# Patient Record
Sex: Male | Born: 1955 | Race: Black or African American | Hispanic: No | Marital: Married | Smoking: Former smoker
Health system: Southern US, Community
[De-identification: ages and names within clinical notes are randomized; demographics above are authoritative.]

## PROBLEM LIST (undated history)

## (undated) DIAGNOSIS — E119 Type 2 diabetes mellitus without complications: Secondary | ICD-10-CM

## (undated) HISTORY — PX: ACHILLES TENDON REPAIR: SUR1153

## (undated) HISTORY — PX: HERNIA REPAIR: SHX51

---

## 2000-06-21 ENCOUNTER — Encounter: Admission: RE | Admit: 2000-06-21 | Discharge: 2000-06-21 | Payer: Self-pay | Admitting: Emergency Medicine

## 2000-09-21 ENCOUNTER — Emergency Department (HOSPITAL_COMMUNITY): Admission: EM | Admit: 2000-09-21 | Discharge: 2000-09-21 | Payer: Self-pay | Admitting: Emergency Medicine

## 2002-02-06 ENCOUNTER — Emergency Department (HOSPITAL_COMMUNITY): Admission: EM | Admit: 2002-02-06 | Discharge: 2002-02-06 | Payer: Self-pay | Admitting: Emergency Medicine

## 2002-02-06 ENCOUNTER — Encounter: Payer: Self-pay | Admitting: Emergency Medicine

## 2006-04-18 ENCOUNTER — Emergency Department (HOSPITAL_COMMUNITY): Admission: EM | Admit: 2006-04-18 | Discharge: 2006-04-18 | Payer: Self-pay | Admitting: Emergency Medicine

## 2006-06-12 ENCOUNTER — Ambulatory Visit: Admission: RE | Admit: 2006-06-12 | Discharge: 2006-06-12 | Payer: Self-pay | Admitting: Internal Medicine

## 2006-06-30 ENCOUNTER — Ambulatory Visit: Payer: Self-pay | Admitting: Pulmonary Disease

## 2010-09-03 ENCOUNTER — Other Ambulatory Visit: Payer: Self-pay | Admitting: Orthopaedic Surgery

## 2010-09-03 ENCOUNTER — Other Ambulatory Visit (HOSPITAL_COMMUNITY)
Admission: RE | Admit: 2010-09-03 | Discharge: 2010-09-03 | Disposition: A | Discharge status: Home / Self Care | Source: Ambulatory Visit | Attending: Orthopaedic Surgery | Admitting: Orthopaedic Surgery

## 2010-09-03 DIAGNOSIS — M6530 Trigger finger, unspecified finger: Secondary | ICD-10-CM | POA: Insufficient documentation

## 2014-02-09 ENCOUNTER — Emergency Department (HOSPITAL_COMMUNITY)
Admission: EM | Admit: 2014-02-09 | Discharge: 2014-02-09 | Disposition: A | Discharge status: Home / Self Care | Payer: PRIVATE HEALTH INSURANCE | Attending: Emergency Medicine | Admitting: Emergency Medicine

## 2014-02-09 ENCOUNTER — Encounter (HOSPITAL_COMMUNITY): Payer: Self-pay | Admitting: Emergency Medicine

## 2014-02-09 DIAGNOSIS — Y9389 Activity, other specified: Secondary | ICD-10-CM | POA: Insufficient documentation

## 2014-02-09 DIAGNOSIS — S61217A Laceration without foreign body of left little finger without damage to nail, initial encounter: Secondary | ICD-10-CM

## 2014-02-09 DIAGNOSIS — W260XXA Contact with knife, initial encounter: Secondary | ICD-10-CM | POA: Diagnosis not present

## 2014-02-09 DIAGNOSIS — S61219A Laceration without foreign body of unspecified finger without damage to nail, initial encounter: Secondary | ICD-10-CM | POA: Insufficient documentation

## 2014-02-09 DIAGNOSIS — E119 Type 2 diabetes mellitus without complications: Secondary | ICD-10-CM | POA: Insufficient documentation

## 2014-02-09 DIAGNOSIS — Y929 Unspecified place or not applicable: Secondary | ICD-10-CM | POA: Insufficient documentation

## 2014-02-09 HISTORY — DX: Type 2 diabetes mellitus without complications: E11.9

## 2014-02-09 MED ORDER — LIDOCAINE HCL (PF) 2 % IJ SOLN
INTRAMUSCULAR | Status: AC
  Filled 2014-02-09: qty 10

## 2014-02-09 MED ORDER — BACITRACIN ZINC 500 UNIT/GM EX OINT
TOPICAL_OINTMENT | CUTANEOUS | Status: AC
  Filled 2014-02-09: qty 0.9

## 2014-02-09 MED ORDER — HYDROCODONE-ACETAMINOPHEN 5-325 MG PO TABS: 1.0000 | ORAL_TABLET | Freq: Four times a day (QID) | ORAL | Status: DC | PRN

## 2014-02-09 NOTE — ED Provider Notes (Signed)
LACERATION REPAIR LEFT 5TH FINGER.  Patient identified by arm band. Permission for the procedure is given by the patient.  Laceration repair was explained to the patient in terms which he understood. He knowledge these explanations and is in agreement with the procedure. Procedural time out taken.  The left fourth and fifth fingers as well as the dorsum of the left hand were painted with Betadine. Digital block was achieved with 2% plain lidocaine. After proper anesthetic, the patient was draped in the usual sterile fashion. The laceration was repaired with 6 interrupted sutures of 4-0 nylon. The wound measures 3.5 cm. The patient has good wound edge approximation and homeostasis. Patient tolerated the procedure well without problem. Dressing applied.  Patient given instructions on keeping the wound clean and dry, as well as to have the sutures removed in 7 days. The patient is advised to return to the emergency department sooner if any signs of infection.  Kathie DikeHobson M Shrey Boike, PA-C 02/09/14 [phone]

## 2014-02-09 NOTE — ED Provider Notes (Signed)
CSN: [id number]     Arrival date & time 02/09/14  [id number] History   First MD Initiated Contact with Patient 02/09/14 0820     Chief Complaint  Patient presents with  . Extremity Laceration     (Consider location/radiation/quality/duration/timing/severity/associated sxs/prior Treatment) HPI patient lacerated left fifth finger on a knife when he reached into a sink at work one hour prior to coming here. No treatment prior to coming here no other injury  No past medical history on file. past medical history diabetes No past surgical history on file. No family history on file. History  Substance Use Topics  . Smoking status: Not on file  . Smokeless tobacco: Not on file  . Alcohol Use: Not on file   no tobacco no alcohol no drugs  Review of Systems  Constitutional: Negative.   Skin: Positive for wound.       Laceration fifth finger left hand  Allergic/Immunologic: Positive for immunocompromised state.       Diabetic      Allergies  Review of patient's allergies indicates not on file.  Home Medications   Prior to Admission medications   Not on File   There were no vitals taken for this visit. Physical Exam  Nursing note and vitals reviewed. Constitutional: He appears well-developed and well-nourished. No distress.  HENT:  Head: Normocephalic and atraumatic.  Right Ear: External ear normal.  Left Ear: External ear normal.  Neck: Neck supple.  Cardiovascular: Normal rate.   Abdominal:  OBese  Musculoskeletal:  Left upper extremity fifth finger linear laceration distal one fourth of middle phalanx and entire length of distal phalanx, dorso ulnar aspect. Not involvingnail, parallel to long axis of finger. Good capillary refill. Full range of motion. Sensation intact    ED Course  Procedures (including critical care time) Labs Review Labs Reviewed - No data to display  Imaging Review No results found.   EKG Interpretation None     Laceration repaired by Mr.  Beverely PaceBryant. MDM   Final diagnoses:  None   planprescription Norco. Wound check 3 days. Sutures out 7 days Diagnosis laceration left fifth finger    Doug SouSam Mikeila Burgen, MD 02/09/14 [phone]

## 2014-02-09 NOTE — ED Notes (Signed)
Laceration to 5th digit of left hand. Pt works in [employer] downstairs (workman's comp) and cut his finger with a knife. Tetanus is UTD

## 2014-02-09 NOTE — Discharge Instructions (Signed)
Sutured Wound Care Do not remove bandage for 3 days unless it becomes wet or dirty. Get your wound checked in 3 days. Sutures to come out in 7 days. Take Tylenol for mild pain or the pain medicine prescribed for bad pain Sutures are stitches that can be used to close wounds. Wound care helps prevent pain and infection.  HOME CARE INSTRUCTIONS   Rest and elevate the injured area until all the pain and swelling are gone.  Only take over-the-counter or prescription medicines for pain, discomfort, or fever as directed by your caregiver.  After 48 hours, gently wash the area with mild soap and water once a day, or as directed. Rinse off the soap. Pat the area dry with a clean towel. Do not rub the wound. This may cause bleeding.  Follow your caregiver's instructions for how often to change the bandage (dressing). Stop using a dressing after 2 days or after the wound stops draining.  If the dressing sticks, moisten it with soapy water and gently remove it.  Apply ointment on the wound as directed.  Avoid stretching a sutured wound.  Drink enough fluids to keep your urine clear or pale yellow.  Follow up with your caregiver for suture removal as directed.  Use sunscreen on your wound for the next 3 to 6 months so the scar will not darken. SEEK IMMEDIATE MEDICAL CARE IF:   Your wound becomes red, swollen, hot, or tender.  You have increasing pain in the wound.  You have a red streak that extends from the wound.  There is pus coming from the wound.  You have a fever.  You have shaking chills.  There is a bad smell coming from the wound.  You have persistent bleeding from the wound. MAKE SURE YOU:   Understand these instructions.  Will watch your condition.  Will get help right away if you are not doing well or get worse. Document Released: 06/04/2004 Document Revised: 07/20/2011 Document Reviewed: 08/31/2010 [facility] Patient Information 2015 [employer], [place]. This information  is not intended to replace advice given to you by your health care provider. Make sure you discuss any questions you have with your health care provider.

## 2014-02-09 NOTE — ED Provider Notes (Signed)
Medical screening examination/treatment/procedure(s) were conducted as a shared visit with non-physician practitioner(s) and myself.  I personally evaluated the patient during the encounter. Mr. Timothy Jordan performed laceration repair only   EKG Interpretation None       Doug SouSam Pari Lombard, MD 02/09/14 [phone]

## 2014-02-12 ENCOUNTER — Emergency Department (HOSPITAL_COMMUNITY)
Admission: EM | Admit: 2014-02-12 | Discharge: 2014-02-12 | Disposition: A | Discharge status: Home / Self Care | Payer: PRIVATE HEALTH INSURANCE | Attending: Emergency Medicine | Admitting: Emergency Medicine

## 2014-02-12 ENCOUNTER — Encounter (HOSPITAL_COMMUNITY): Payer: Self-pay | Admitting: Emergency Medicine

## 2014-02-12 DIAGNOSIS — Y990 Civilian activity done for income or pay: Secondary | ICD-10-CM | POA: Insufficient documentation

## 2014-02-12 DIAGNOSIS — Y9289 Other specified places as the place of occurrence of the external cause: Secondary | ICD-10-CM | POA: Insufficient documentation

## 2014-02-12 DIAGNOSIS — Y9389 Activity, other specified: Secondary | ICD-10-CM | POA: Diagnosis not present

## 2014-02-12 DIAGNOSIS — Z4801 Encounter for change or removal of surgical wound dressing: Secondary | ICD-10-CM | POA: Diagnosis present

## 2014-02-12 DIAGNOSIS — E119 Type 2 diabetes mellitus without complications: Secondary | ICD-10-CM | POA: Insufficient documentation

## 2014-02-12 DIAGNOSIS — S61217D Laceration without foreign body of left little finger without damage to nail, subsequent encounter: Secondary | ICD-10-CM | POA: Diagnosis not present

## 2014-02-12 DIAGNOSIS — Z87891 Personal history of nicotine dependence: Secondary | ICD-10-CM | POA: Diagnosis not present

## 2014-02-12 DIAGNOSIS — W260XXA Contact with knife, initial encounter: Secondary | ICD-10-CM | POA: Diagnosis not present

## 2014-02-12 DIAGNOSIS — S61219D Laceration without foreign body of unspecified finger without damage to nail, subsequent encounter: Secondary | ICD-10-CM

## 2014-02-12 MED ORDER — BACITRACIN ZINC 500 UNIT/GM EX OINT
1.0000 "application " | TOPICAL_OINTMENT | Freq: Two times a day (BID) | CUTANEOUS | Status: DC
  Administered 2014-02-12: 1 via TOPICAL
  Filled 2014-02-12: qty 0.9

## 2014-02-12 NOTE — Discharge Instructions (Signed)

## 2014-02-12 NOTE — ED Notes (Signed)
Wound dressed with non-adherent dressing and wrapped with gauze. Instructions re-iterated to patient on wound care. Verbal understanding obtained.

## 2014-02-12 NOTE — ED Notes (Signed)
6 stiches present in left pinky. Finger does not show signs of infection at this time. Pt states he hit his left pinky on something this morning and it is throbbing.

## 2014-02-12 NOTE — ED Notes (Signed)
Patient with no complaints at this time. Respirations even and unlabored. Skin warm/dry. Discharge instructions reviewed with patient at this time. Patient given opportunity to voice concerns/ask questions. Patient discharged at this time and left Emergency Department with steady gait.   

## 2014-02-12 NOTE — ED Provider Notes (Signed)
CSN: [id number]     Arrival date & time 02/12/14  0803 History  This chart was scribed for No att. providers found by Annye AsaAnna Dorsett, ED Scribe. This patient was seen in room APA03/[facility]03 and the patient's care was started at 8:13 AM.    Chief Complaint  Patient presents with  . Wound Check    The history is provided by the patient. No language interpreter was used.    HPI Comments: Timothy Jordan is a 58 y.o. male who presents to the Emergency Department requesting recheck of laceration to his left fifth finger that occurred 3 days ago.  He denies any complications, such as fever, redness, drainage or swelling.  states the wound is healing well.  States he was advised to return here today for a recheck and  Friday to have the stitches removed.    Past Medical History  Diagnosis Date  . Diabetes mellitus without complication    Past Surgical History  Procedure Laterality Date  . Hernia repair    . Achilles tendon repair     No family history on file. History  Substance Use Topics  . Smoking status: Former Games developermoker  . Smokeless tobacco: Not on file  . Alcohol Use: No    Review of Systems  Constitutional: Negative for fever and chills.  Musculoskeletal: Negative for arthralgias, back pain and joint swelling.  Skin: Positive for wound.       Laceration left fifth finger  Neurological: Negative for dizziness, weakness and numbness.  Hematological: Does not bruise/bleed easily.  All other systems reviewed and are negative.   A complete 10 system review of systems was obtained and all systems are negative except as noted in the HPI and PMH.    Allergies  Review of patient's allergies indicates no known allergies.  Home Medications   Prior to Admission medications   Medication Sig Start Date End Date Taking? Authorizing Provider  HYDROcodone-acetaminophen (NORCO) 5-325 MG per tablet Take 1 tablet by mouth every 6 (six) hours as needed for moderate pain or severe pain. 02/09/14    Doug SouSam Jacubowitz, MD  HYDROcodone-acetaminophen (NORCO) 5-325 MG per tablet Take 1 tablet by mouth every 6 (six) hours as needed for moderate pain or severe pain. 02/09/14   Doug SouSam Jacubowitz, MD   There were no vitals taken for this visit. Physical Exam  Nursing note and vitals reviewed. Constitutional: He is oriented to person, place, and time. He appears well-developed and well-nourished. No distress.  HENT:  Head: Normocephalic and atraumatic.  Cardiovascular: Normal rate, regular rhythm, normal heart sounds and intact distal pulses.   No murmur heard. Pulmonary/Chest: Effort normal and breath sounds normal. No respiratory distress.  Musculoskeletal: Normal range of motion. He exhibits no edema and no tenderness.  Neurological: He is alert and oriented to person, place, and time. He exhibits normal muscle tone. Coordination normal.  Skin: Skin is warm and dry. Laceration noted.  Laceration to the lateral left fifth finger.  Appears to be healing well.  No erythema, drainage or edema.  Sensation intact, pt has full ROM of the finger  Psychiatric: He has a normal mood and affect. His behavior is normal.    ED Course  Procedures   DIAGNOSTIC STUDIES: Oxygen Saturation is 95% by my interpretation.    COORDINATION OF CARE: 8:13 AM Discussed treatment plan with pt at bedside and pt agreed to plan.   Labs Review Labs Reviewed - No data to display  Imaging Review No results found.  EKG Interpretation None      MDM   Final diagnoses:  Laceration of finger, subsequent encounter    Finger appears to be healing well.  No clinical signs of infection, NV intact.  Pt agrees to return here on Friday for suture removal.      Mir Fullilove L. Trisha Mangle, PA-C 02/12/14 [phone]

## 2014-02-12 NOTE — ED Provider Notes (Signed)
Medical screening examination/treatment/procedure(s) were performed by non-physician practitioner and as supervising physician I was immediately available for consultation/collaboration.   EKG Interpretation None       Donnetta HutchingBrian Gabriela Irigoyen, MD 02/12/14 1434

## 2014-02-12 NOTE — ED Notes (Signed)
Patient states he cut his left pinky finger on a knife while working Friday. States he was treated here and had laceration stitched. Was told to return today for recheck of wound.

## 2014-02-16 ENCOUNTER — Encounter (HOSPITAL_COMMUNITY): Payer: Self-pay | Admitting: Emergency Medicine

## 2014-02-16 ENCOUNTER — Emergency Department (HOSPITAL_COMMUNITY)
Admission: EM | Admit: 2014-02-16 | Discharge: 2014-02-16 | Disposition: A | Discharge status: Home / Self Care | Payer: PRIVATE HEALTH INSURANCE | Attending: Emergency Medicine | Admitting: Emergency Medicine

## 2014-02-16 DIAGNOSIS — Z794 Long term (current) use of insulin: Secondary | ICD-10-CM | POA: Insufficient documentation

## 2014-02-16 DIAGNOSIS — Z4802 Encounter for removal of sutures: Secondary | ICD-10-CM | POA: Insufficient documentation

## 2014-02-16 DIAGNOSIS — Z7982 Long term (current) use of aspirin: Secondary | ICD-10-CM | POA: Insufficient documentation

## 2014-02-16 DIAGNOSIS — E119 Type 2 diabetes mellitus without complications: Secondary | ICD-10-CM | POA: Diagnosis not present

## 2014-02-16 DIAGNOSIS — Z79899 Other long term (current) drug therapy: Secondary | ICD-10-CM | POA: Diagnosis not present

## 2014-02-16 DIAGNOSIS — Z87891 Personal history of nicotine dependence: Secondary | ICD-10-CM | POA: Insufficient documentation

## 2014-02-16 NOTE — Discharge Instructions (Signed)
°Emergency Department Resource Guide °1) Find a Doctor and Pay Out of Pocket °Although you won't have to find out who is covered by your insurance plan, it is a good idea to ask around and get recommendations. You will then need to call the office and see if the doctor you have chosen will accept you as a new patient and what types of options they offer for patients who are self-pay. Some doctors offer discounts or will set up payment plans for their patients who do not have insurance, but you will need to ask so you aren't surprised when you get to your appointment. ° °2) Contact Your Local Health Department °Not all health departments have doctors that can see patients for sick visits, but many do, so it is worth a call to see if yours does. If you don't know where your local health department is, you can check in your phone book. The CDC also has a tool to help you locate your state's health department, and many state websites also have listings of all of their local health departments. ° °3) Find a Walk-in Clinic °If your illness is not likely to be very severe or complicated, you may want to try a walk in clinic. These are popping up all over the country in pharmacies, drugstores, and shopping centers. They're usually staffed by nurse practitioners or physician assistants that have been trained to treat common illnesses and complaints. They're usually fairly quick and inexpensive. However, if you have serious medical issues or chronic medical problems, these are probably not your best option. ° °No Primary Care Doctor: °- Call [facility] at  832-8000 - they can help you locate a primary care doctor that  accepts your insurance, provides certain services, etc. °- Physician Referral Service- 1-800-533-3463 ° °Chronic Pain Problems: °Organization         Address  Phone   Notes  °[facility]  ([phone]) 297-2271 Patients need to be referred by their primary care doctor.  ° °Medication  Assistance: °Organization         Address  Phone   Notes  °[place] [facility] [address] °[place], [place] [address] (336) 641-8030 --Must be a resident of [place] °-- Must have NO insurance coverage whatsoever (no Medicaid/ Medicare, etc.) °-- The pt. MUST have a primary care doctor that directs their care regularly and follows them in the community °  °[facility]  (866) 331-1348   °[employer] Way  (888) 892-1162   ° °Agencies that provide inexpensive medical care: °Organization         Address  Phone   Notes  °[facility]  (336) 832-8035   °[facility]    (336) 832-7272   °[facility] [address] °[place], [place] [address] (336) 832-4777   °[facility] [address], °[place] (336) 271-4999   °[facility]    (336) 373-0678   °[facility]    (336) 272-1050   °[facility] ° [address], [place] Phone:  (336) 832-4444, Fax:  (336) 832-4440 Hours of Operation:  9 am - 6 pm, M-F.  Also accepts Medicaid/Medicare and self-pay.  °[facility] ° [address], [place] Phone: (336) [phone], Fax: (336) [phone]. Hours of Operation:  8:30 am - 5:30 pm, M-F.  Also accepts Medicaid and self-pay.  °[facility] [place] [address], [place] Phone: (336) 878-6027   °[facility] [address], [place], [place] (336)723-1848, Ext. 123 Mondays & Thursdays: 7-9 AM.  First 15 patients are seen on a first come, first serve basis. °  ° °Medicaid-accepting [place] Providers: ° °Organization         Address  Phone   Notes  °[facility] [address], [place] (336) 641-2100 Also accepts self-pay patients.  °[facility] [address], [place] ° (336) 856-9996   °[facility] [address], [place]  (336) 288-8857   °[facility] [address] [place] [address], [place] (336) 299-7000   °Veita Bland [address], [place]  ° (336) 373-1557 Only accepts [place] Access Medicaid patients after they have their name applied to their card.  ° °Self-Pay (no insurance) in [place]: ° °Organization         Address  Phone   Notes  °Sickle Cell Patients, [facility] [address], [place] (336) 832-1970   °[facility] [address], [place] (336) 832-4400   °[facility] ° [address], [place] (336) 992-4800   °[facility]/Dr. Osei-Bonsu ° [address] [place] [address], [place] or [address], [place] (336) 841-8500 Phone number for both [place] and [place] locations is the same.  °[facility] [address], [place] (336) 299-0000   °[facility] [address] [place] [address], [place] or [address] (336) 852-7530 °(336) 878-2260   °[facility] [address], [place] (336) 350-1642, phone; (336) 294-5005, fax Sees patients 1st and 3rd Saturday of every month.  Must not qualify for public or private insurance (i.e. Medicaid, Medicare, [place] Health Choice, Veterans' Benefits) • Household income should be no more than 200% of the poverty level •The clinic cannot treat you if you are pregnant or think you are pregnant • Sexually transmitted diseases are not treated at the clinic.  ° ° °Dental Care: °Organization         Address  Phone  Notes  °[place] [facility] [address], [place] (336) 641-6152 Accepts children up to age 21 who are enrolled in Medicaid or [place] Health Choice; pregnant women with a Medicaid card; and children who have applied for Medicaid or [place] Health Choice, but were declined, whose parents can pay a reduced fee at time of service.  °[place]  [facility] [place]  [address], [place] (336) 641-7733 Accepts children up to age 21 who are enrolled in Medicaid or [place] Health Choice; pregnant women with a Medicaid card; and children who have applied for Medicaid or [place] Health Choice, but were declined, whose parents can pay a reduced fee at time of service.  °[facility] ° [address], [place] (336) 641-4533 Patients are seen by appointment only. Walk-ins are not accepted. [facility] will see patients 18 years of age and older. °Monday - Tuesday (8am-5pm) °Most Wednesdays (8:30-5pm) °$30 per visit, cash only  °[facility] ° [address], [place] (336) 641-4533 Patients are seen by appointment only. Walk-ins are not accepted. [facility] will see patients 18 years of age and older. °One   Wednesday Evening (Monthly: Volunteer Based).  $30 per visit, cash only  °[facility]  (919) 537-3737 for adults; Children under age 4, call Graduate Pediatric Dentistry at ([phone]) [phone]. Children aged 4-14, please call (919) [phone] to request a pediatric application. ° Dental services are provided in all areas of dental care including fillings, crowns and bridges, complete and partial dentures, implants, gum treatment, root canals, and extractions. Preventive care is also provided. Treatment is provided to both adults and children. °Patients are selected via a lottery and there is often a waiting list. °  °[facility] [address], °[place] ° (336) 763-8833 [email or web address] °  °[facility] [address], [place], [place] (336)723-1848, Ext. 123 Second and Fourth Thursday of each month, opens at 6:30 AM; Clinic ends at 9 AM.  Patients are seen on a first-come first-served basis, and a limited number are seen during each clinic.  ° °[facility] ° [address], [place], [place] (336) 723-7904    Eligibility Requirements °You must have lived in [place], [place], or [place] counties for at least the last three months. °  You cannot be eligible for state or federal sponsored healthcare insurance, including Veterans Administration, Medicaid, or Medicare. °  You generally cannot be eligible for healthcare insurance through your employer.  °  How to apply: °Eligibility screenings are held every Tuesday and Wednesday afternoon from 1:00 pm until 4:00 pm. You do not need an appointment for the interview!  °[facility] [address], [place], [place] 336-631-2330   °[facility]  336-342-8273   °[place]  336-703-3100   °[facility]  336-570-6415   ° °Behavioral Health Resources in the Community: °Intensive Outpatient Programs °Organization         Address  Phone  Notes  °[facility] [address], [place], [place] 336-878-6098   °[facility] [address], [place], [place] 336-832-9800   °[facility] [address], [place], [place] ° 336-882-2125   °[facility] [address],  °[place], [place] 1-800-853-5163 or 336-641-4981   °Substance Abuse Resources °Organization         Address  Phone  Notes  °[facility]  336-882-2125   °[facility]  336-784-9470   °[facility]  336-285-9073   °[facility]  336-845-3988   °Residential & Outpatient Substance Abuse Program  1-800-659-3381   °Psychological Services °Organization         Address  Phone  Notes  °[facility]  336- 832-9600   °[facility]  336- 378-7881   °[facility] [address], [place] 1-800-853-5163 or 336-641-4981   ° °Mobile Crisis Teams °Organization         Address  Phone  Notes  °[facility], Mobile Crisis Care Unit  1-877-626-1772   °Assertive °[facility] ° [address]  [place], [place] 336-834-9664   °Sharon DeEsch [address] °[place] 336-554-5454   ° °Self-Help/Support Groups °Organization         Address  Phone             Notes  °[facility] of  - variety of support groups  336- 373-1402 Call for more information  °Narcotics Anonymous (NA), [facility] [address], °[place]  2 meetings at this location  ° °  Residential Treatment Programs Organization         Address  Phone  Notes  [facility] [address],    [place]  [phone])[phone]   [facility]  [address], [place] [phone], [place], [place] [phone]   [facility] [address], [place] [phone] Admissions: 8am-3pm M-F  [facility] [address]    [place], [place] [phone](332) [phone]   [facility] [address], [place], [place] [phone]   [facility] [address]  [place], [place] [phone]   [facility] Intensive Outpatient [address], [place], [place] [phone]   [facility] ([facility] [address]  [place], [place] [phone] or [phone]   [facility] ([facility]) [address] [place], [place] [phone] Accepts Medicaid  [facility] [address]  [place] [phone] Substance Abuse/Addiction Treatment   [facility] Organization         Address  Phone  Notes  [facility]  [phone](888) [phone]   Angie FavaJulie Brannon, PhD [address] [place], [place]   [phone](336) [phone] or [phone](336) ([phone]   [facility]   [address] [place], [place] [phone](336) [phone]   [facility] [address], [place], [place] [phone])[phone](336) [phone] Insurance/Medicaid/sponsorship through [facility]  [facility] [address]                                    [place], [place] ([phone]) [phone](336) [phone] Therapy/tele-psych/case    [facility] [address]   [place], [place] [phone](336) [phone]    Dr. Lolly MustacheArfeen  [phone](336) [phone]   [facility]  [employer] [facility] 1) [address], [place] 2) [address], [place] 3)  [address], [place] [phone](336) [phone](336) [phone]  [phone](336) [phone]   [facility] Child Abuse Hot[facility] [phone](336) [phone] or [phone](336) [phone] (After Hours)      Change the dressing tomorrow. Keep the area clean and dry. Call your regular medical doctor today to schedule a follow up appointment for a recheck within the next 3 days.  Return to the Emergency Department immediately if worsening.

## 2014-02-16 NOTE — ED Provider Notes (Signed)
CSN: [id number]     Arrival date & time 02/16/14  1243 History   First MD Initiated Contact with Patient 02/16/14 1330     Chief Complaint  Patient presents with  . Suture / Staple Removal     HPI Pt was seen at 1335. Per pt, c/o gradual onset and persistence of constant sutures left 5th finger since 02/09/2014. Pt states he accidentally cut his finger with a knife while at work and had sutures placed. Pt states his wound "is good" and he is here to have the sutures removed. Denies pain, no rash, no fevers.    Past Medical History  Diagnosis Date  . Diabetes mellitus without complication    Past Surgical History  Procedure Laterality Date  . Hernia repair    . Achilles tendon repair      History  Substance Use Topics  . Smoking status: Former Games developermoker  . Smokeless tobacco: Not on file  . Alcohol Use: No    Review of Systems ROS: Statement: All systems negative except as marked or noted in the HPI; Constitutional: Negative for fever and chills. ; ; Eyes: Negative for eye pain, redness and discharge. ; ; ENMT: Negative for ear pain, hoarseness, nasal congestion, sinus pressure and sore throat. ; ; Cardiovascular: Negative for chest pain, palpitations, diaphoresis, dyspnea and peripheral edema. ; ; Respiratory: Negative for cough, wheezing and stridor. ; ; Gastrointestinal: Negative for nausea, vomiting, diarrhea, abdominal pain, blood in stool, hematemesis, jaundice and rectal bleeding. . ; ; Genitourinary: Negative for dysuria, flank pain and hematuria. ; ; Musculoskeletal: Negative for back pain and neck pain. Negative for swelling and trauma.; ; Skin: +suture removal. Negative for pruritus, rash, abrasions, blisters, bruising and skin lesion.; ; Neuro: Negative for headache, lightheadedness and neck stiffness. Negative for weakness, altered level of consciousness , altered mental status, extremity weakness, paresthesias, involuntary movement, seizure and syncope.      Allergies   Review of patient's allergies indicates no known allergies.  Home Medications   Prior to Admission medications   Medication Sig Start Date End Date Taking? Authorizing Provider  aspirin EC 81 MG tablet Take 81 mg by mouth daily.   Yes Historical Provider, MD  HYDROcodone-acetaminophen (NORCO) 5-325 MG per tablet Take 1 tablet by mouth every 6 (six) hours as needed for moderate pain or severe pain. 02/09/14  Yes Doug SouSam Jacubowitz, MD  insulin glargine (LANTUS) 100 UNIT/ML injection Inject 70 Units into the skin every morning.   Yes Historical Provider, MD  lisinopril (PRINIVIL,ZESTRIL) 10 MG tablet Take 10 mg by mouth daily.   Yes Historical Provider, MD  metFORMIN (GLUCOPHAGE) 500 MG tablet Take 500 mg by mouth 2 (two) times daily with a meal.   Yes Historical Provider, MD   BP 123/69  Pulse 86  Temp(Src) 99.5 F (37.5 C) (Oral)  Resp 20  Ht 5\' 10"  (1.778 m)  Wt 231 lb (104.781 kg)  BMI 33.15 kg/m2  SpO2 94% Physical Exam 1340: Physical examination:  Nursing notes reviewed; Vital signs and O2 SAT reviewed;  Constitutional: Well developed, Well nourished, Well hydrated, In no acute distress; Head:  Normocephalic, atraumatic; Eyes: EOMI, PERRL, No scleral icterus; ENMT: Mouth and pharynx normal, Mucous membranes moist; Neck: Supple, Full range of motion;; Cardiovascular: Regular rate and rhythm, No gallop; Respiratory: Breath sounds clear & equal bilaterally, No wheezes.  Speaking full sentences with ease, Normal respiratory effort/excursion; Chest: Nontender, Movement normal; Abdomen: Soft, Nondistended, Normal bowel sounds;; Extremities: Pulses normal, No deformity. +6  sutures intact left 5th digit distal dorsal medial suture line with edges well approximated, no erythema, no edema, no ecchymosis, no purulence, no tenderness. NT entire left 5th finger.;;  Neuro: AA&Ox3, Major CN grossly intact.  Speech clear. No gross focal motor or sensory deficits in extremities. Climbs on and off stretcher  easily by himself. Gait steady.; Skin: Color normal, Warm, Dry.   ED Course  Procedures     MDM  MDM Reviewed: previous chart, nursing note and vitals    1345:  Suture site healing well, no signs of infection. 6 sutures removed intact by ED RN and DSD applied. Wound care reviewed. F/U prn.    Samuel JesterKathleen Demoni Gergen, DO 02/18/14 2226

## 2014-02-16 NOTE — ED Notes (Signed)
Discharge instructions given and reviewed with patient.  Patient verbalized understanding to follow up as needed.  Patient ambulatory; discharged home in good condition. 

## 2014-02-16 NOTE — ED Notes (Signed)
Stitches place left pinky finger last Friday.

## 2014-02-19 ENCOUNTER — Encounter (HOSPITAL_COMMUNITY): Payer: Self-pay | Admitting: Emergency Medicine

## 2014-02-19 ENCOUNTER — Emergency Department (HOSPITAL_COMMUNITY)
Admission: EM | Admit: 2014-02-19 | Discharge: 2014-02-19 | Disposition: A | Discharge status: Home / Self Care | Payer: PRIVATE HEALTH INSURANCE | Attending: Emergency Medicine | Admitting: Emergency Medicine

## 2014-02-19 DIAGNOSIS — Z87891 Personal history of nicotine dependence: Secondary | ICD-10-CM | POA: Diagnosis not present

## 2014-02-19 DIAGNOSIS — Z791 Long term (current) use of non-steroidal anti-inflammatories (NSAID): Secondary | ICD-10-CM | POA: Diagnosis not present

## 2014-02-19 DIAGNOSIS — Z7982 Long term (current) use of aspirin: Secondary | ICD-10-CM | POA: Diagnosis not present

## 2014-02-19 DIAGNOSIS — E119 Type 2 diabetes mellitus without complications: Secondary | ICD-10-CM | POA: Insufficient documentation

## 2014-02-19 DIAGNOSIS — Z79899 Other long term (current) drug therapy: Secondary | ICD-10-CM | POA: Insufficient documentation

## 2014-02-19 DIAGNOSIS — T8130XA Disruption of wound, unspecified, initial encounter: Secondary | ICD-10-CM | POA: Diagnosis not present

## 2014-02-19 DIAGNOSIS — Z4801 Encounter for change or removal of surgical wound dressing: Secondary | ICD-10-CM | POA: Diagnosis present

## 2014-02-19 DIAGNOSIS — T8131XA Disruption of external operation (surgical) wound, not elsewhere classified, initial encounter: Secondary | ICD-10-CM

## 2014-02-19 DIAGNOSIS — Y838 Other surgical procedures as the cause of abnormal reaction of the patient, or of later complication, without mention of misadventure at the time of the procedure: Secondary | ICD-10-CM | POA: Insufficient documentation

## 2014-02-19 NOTE — ED Provider Notes (Signed)
CSN: [id number]     Arrival date & time 02/19/14  1300 History  This chart was scribed for non-physician practitioner Burgess AmorJulie Hawkins Seaman, PA-C working with Glynn OctaveStephen Rancour, MD by Littie Deedsichard Sun, ED Scribe. This patient was seen in room APFT20/APFT20 and the patient's care was started at 2:18 PM.    Chief Complaint  Patient presents with  . Wound Check      The history is provided by the patient. No language interpreter was used.   HPI Comments: Timothy Jordan is a 58 y.o. male who presents to the Emergency Department for a wound check. Patient had cut his left 5th finger and had his stitches removed 3 days ago, but he reports that the wound has reopened starting today. 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 He has been covering his wound with band-aids, tape and gauze, and he did have his wound covered while at work today. Patient is right handed, but he requires both hands for his job. He has tomorrow and the day after off from work, but he is supposed to finish his shift tonight; however, he physically does not think he can return to work today. Patient denies any drainage or signs of infection. He has a hx of DM; he did not check his blood glucose today, but his reading yesterday measured 137. Patient has NKDA.    Past Medical History  Diagnosis Date  . Diabetes mellitus without complication    Past Surgical History  Procedure Laterality Date  . Hernia repair    . Achilles tendon repair     History reviewed. No pertinent family history. History  Substance Use Topics  . Smoking status: Former Games developermoker  . Smokeless tobacco: Not on file  . Alcohol Use: No    Review of Systems  Constitutional: Negative for fever.  HENT: Negative for congestion and sore throat.   Eyes: Negative.   Respiratory: Negative for chest tightness and  shortness of breath.   Cardiovascular: Negative for chest pain.  Gastrointestinal: Negative for nausea and abdominal pain.  Genitourinary: Negative.   Musculoskeletal: Negative for arthralgias, joint swelling and neck pain.  Skin: Positive for wound. Negative for rash.  Neurological: Negative for dizziness, weakness, light-headedness, numbness and headaches.  Psychiatric/Behavioral: Negative.       Allergies  Review of patient's allergies indicates no known allergies.  Home Medications   Prior to Admission medications   Medication Sig Start Date End Date Taking? Authorizing Provider  aspirin EC 81 MG tablet Take 81 mg by mouth daily.   Yes Historical Provider, MD  insulin glargine (LANTUS) 100 UNIT/ML injection Inject 70 Units into the skin every morning.   Yes Historical Provider, MD  lisinopril (PRINIVIL,ZESTRIL) 10 MG tablet Take 10 mg by mouth daily.   Yes Historical Provider, MD  metFORMIN (GLUCOPHAGE) 500 MG tablet Take 500 mg by mouth 2 (two) times daily with a meal.   Yes Historical Provider, MD   BP 135/73  Pulse 89  Temp(Src) 99.1 F (37.3 C) (Oral)  Ht 5\' 10"  (1.778 m)  Wt 231 lb (104.781 kg)  BMI 33.15 kg/m2  SpO2 98% Physical Exam  Nursing note and vitals reviewed. Constitutional: He appears well-developed and well-nourished.  HENT:  Head: Normocephalic and atraumatic.  Eyes: Conjunctivae are normal.  Neck: Normal range of  motion.  Cardiovascular: Normal rate, regular rhythm, normal heart sounds and intact distal pulses.   Pulmonary/Chest: Effort normal and breath sounds normal. He has no wheezes.  Abdominal: Soft. Bowel sounds are normal. There is no tenderness.  Musculoskeletal: Normal range of motion.  Neurological: He is alert.  Skin: Skin is warm and dry.  Slight dehiscence along the wound edge of his left 5th finger. No bleeding and no drainage. No red streaking or sign of infection.  Psychiatric: He has a normal mood and affect.    ED Course   Procedures  DIAGNOSTIC STUDIES: Oxygen Saturation is 98% on RA, nml by my interpretation.    COORDINATION OF CARE: 2:25 PM-Discussed treatment plan which includes a work note with pt at bedside and pt agreed to plan.   Labs Review Labs Reviewed - No data to display  Imaging Review No results found.   EKG Interpretation None      MDM   Final diagnoses:  Wound dehiscence, initial encounter    Mild dehiscence of wound edge, but I feel this will continue to do well and heal completely with a few more days and further support of the wound.  Sterile strips were applied to the wound edges.  He was advised to keep covered, dry until the strips fall away.  Advised prn f/u for any additional problems or concerns.    I personally performed the services described in this documentation, which was scribed in my presence. The recorded information has been reviewed and is accurate.    Burgess AmorJulie Ritu Gagliardo, PA-C 02/19/14 2059

## 2014-02-19 NOTE — ED Notes (Signed)
Pt had lac to lt 5th finger 10 days ago .  Sutures removed on Friday. Today at work felt stinging at wound site and area had opened up.

## 2014-02-19 NOTE — ED Notes (Signed)
Patient states he had his stitches removed from left pinky finger on Friday. States wound has reopened and hurting.

## 2014-02-19 NOTE — Discharge Instructions (Signed)
Wound Dehiscence Wound dehiscence is when a surgical cut (incision) opens up and does not heal properly. It usually happens 7-10 days after surgery. You may have bleeding from the cut. You may also have pain or a fever. This condition should be treated early. HOME CARE  Only take medicines as told by your doctor.  Take your antibiotic medicine as told. Finish it even if you start to feel better.  Wash your wound with warm, soapy water 2 times a day, or as told. Pat the wound dry. Do not rub the wound.  Change bandages (dressings) as often as told. Wash your hands before and after changing bandages. Apply bandages as told.  Take showers. Do not soak the wound, bathe, swim, or use a hot tub until directed by your doctor.  Avoid exercises that make you sweat.  Use medicines that stop itching as told by your doctor. The wound may itch as it heals. Do not pick or scratch at the wound.  Do not lift more than 10 pounds (4.5 kilograms) until the wound is healed, or as told by your doctor.  Keep all doctor visits as told. GET HELP IF:  You have a lot of bleeding from your surgical cut.  Your wound does not seem to be healing right.  You have a fever. GET HELP RIGHT AWAY IF:  You have more puffiness (swelling) or redness around the wound.  You have more pain in the wound.  You have yellowish white fluid (pus) coming from the wound.  More of the wound breaks open. MAKE SURE YOU:  Understand these instructions.  Will watch your condition.  Will get help right away if you are not doing well or get worse. Document Released: 04/15/2009 Document Revised: 09/11/2013 Document Reviewed: 01/02/2013 [facility] Patient Information 2015 [employer] [place]. This information is not intended to replace advice given to you by your health care provider. Make sure you discuss any questions you have with your health care provider.   Your wound should improve over the next several days.  Avoid keeping  the Steri-Strips wet as discussed.  Continue to minimize use of the finger until your wound is better healed.  There is no sign of infection today.

## 2014-02-20 NOTE — ED Provider Notes (Signed)
Medical screening examination/treatment/procedure(s) were performed by non-physician practitioner and as supervising physician I was immediately available for consultation/collaboration.   EKG Interpretation None       Glynn OctaveStephen Jarika Robben, MD 02/20/14 [phone]

## 2017-03-05 ENCOUNTER — Emergency Department (HOSPITAL_COMMUNITY)

## 2017-03-05 ENCOUNTER — Inpatient Hospital Stay (HOSPITAL_COMMUNITY)
Admission: EM | Admit: 2017-03-05 | Discharge: 2017-03-25 | DRG: 004 | Disposition: A | Discharge status: Other Acute Inpatient Hospital | Attending: Internal Medicine | Admitting: Internal Medicine

## 2017-03-05 ENCOUNTER — Encounter (HOSPITAL_COMMUNITY): Payer: Self-pay

## 2017-03-05 DIAGNOSIS — N189 Chronic kidney disease, unspecified: Secondary | ICD-10-CM | POA: Diagnosis not present

## 2017-03-05 DIAGNOSIS — R918 Other nonspecific abnormal finding of lung field: Secondary | ICD-10-CM

## 2017-03-05 DIAGNOSIS — R451 Restlessness and agitation: Secondary | ICD-10-CM | POA: Diagnosis not present

## 2017-03-05 DIAGNOSIS — J189 Pneumonia, unspecified organism: Secondary | ICD-10-CM

## 2017-03-05 DIAGNOSIS — R471 Dysarthria and anarthria: Secondary | ICD-10-CM | POA: Diagnosis not present

## 2017-03-05 DIAGNOSIS — J9811 Atelectasis: Secondary | ICD-10-CM | POA: Diagnosis not present

## 2017-03-05 DIAGNOSIS — J151 Pneumonia due to Pseudomonas: Secondary | ICD-10-CM | POA: Diagnosis not present

## 2017-03-05 DIAGNOSIS — I471 Supraventricular tachycardia: Secondary | ICD-10-CM | POA: Diagnosis not present

## 2017-03-05 DIAGNOSIS — Z781 Physical restraint status: Secondary | ICD-10-CM

## 2017-03-05 DIAGNOSIS — R410 Disorientation, unspecified: Secondary | ICD-10-CM

## 2017-03-05 DIAGNOSIS — A419 Sepsis, unspecified organism: Secondary | ICD-10-CM | POA: Diagnosis not present

## 2017-03-05 DIAGNOSIS — J988 Other specified respiratory disorders: Secondary | ICD-10-CM | POA: Diagnosis not present

## 2017-03-05 DIAGNOSIS — R0600 Dyspnea, unspecified: Secondary | ICD-10-CM

## 2017-03-05 DIAGNOSIS — E872 Acidosis: Secondary | ICD-10-CM | POA: Diagnosis not present

## 2017-03-05 DIAGNOSIS — I10 Essential (primary) hypertension: Secondary | ICD-10-CM | POA: Diagnosis present

## 2017-03-05 DIAGNOSIS — I129 Hypertensive chronic kidney disease with stage 1 through stage 4 chronic kidney disease, or unspecified chronic kidney disease: Secondary | ICD-10-CM | POA: Diagnosis not present

## 2017-03-05 DIAGNOSIS — J9601 Acute respiratory failure with hypoxia: Secondary | ICD-10-CM | POA: Diagnosis not present

## 2017-03-05 DIAGNOSIS — R7989 Other specified abnormal findings of blood chemistry: Secondary | ICD-10-CM | POA: Diagnosis present

## 2017-03-05 DIAGNOSIS — G060 Intracranial abscess and granuloma: Principal | ICD-10-CM | POA: Diagnosis present

## 2017-03-05 DIAGNOSIS — J9622 Acute and chronic respiratory failure with hypercapnia: Secondary | ICD-10-CM

## 2017-03-05 DIAGNOSIS — D6489 Other specified anemias: Secondary | ICD-10-CM | POA: Diagnosis present

## 2017-03-05 DIAGNOSIS — E46 Unspecified protein-calorie malnutrition: Secondary | ICD-10-CM | POA: Diagnosis present

## 2017-03-05 DIAGNOSIS — Z794 Long term (current) use of insulin: Secondary | ICD-10-CM

## 2017-03-05 DIAGNOSIS — Z7982 Long term (current) use of aspirin: Secondary | ICD-10-CM

## 2017-03-05 DIAGNOSIS — Z1624 Resistance to multiple antibiotics: Secondary | ICD-10-CM | POA: Diagnosis not present

## 2017-03-05 DIAGNOSIS — G8190 Hemiplegia, unspecified affecting unspecified side: Secondary | ICD-10-CM | POA: Diagnosis present

## 2017-03-05 DIAGNOSIS — R6521 Severe sepsis with septic shock: Secondary | ICD-10-CM | POA: Diagnosis not present

## 2017-03-05 DIAGNOSIS — R740 Nonspecific elevation of levels of transaminase and lactic acid dehydrogenase [LDH]: Secondary | ICD-10-CM | POA: Diagnosis not present

## 2017-03-05 DIAGNOSIS — E1165 Type 2 diabetes mellitus with hyperglycemia: Secondary | ICD-10-CM

## 2017-03-05 DIAGNOSIS — R402424 Glasgow coma scale score 9-12, 24 hours or more after hospital admission: Secondary | ICD-10-CM | POA: Diagnosis present

## 2017-03-05 DIAGNOSIS — N170 Acute kidney failure with tubular necrosis: Secondary | ICD-10-CM | POA: Diagnosis present

## 2017-03-05 DIAGNOSIS — G936 Cerebral edema: Secondary | ICD-10-CM | POA: Diagnosis present

## 2017-03-05 DIAGNOSIS — Z4659 Encounter for fitting and adjustment of other gastrointestinal appliance and device: Secondary | ICD-10-CM

## 2017-03-05 DIAGNOSIS — R29810 Facial weakness: Secondary | ICD-10-CM

## 2017-03-05 DIAGNOSIS — Z978 Presence of other specified devices: Secondary | ICD-10-CM

## 2017-03-05 DIAGNOSIS — R29898 Other symptoms and signs involving the musculoskeletal system: Secondary | ICD-10-CM

## 2017-03-05 DIAGNOSIS — E1122 Type 2 diabetes mellitus with diabetic chronic kidney disease: Secondary | ICD-10-CM | POA: Diagnosis present

## 2017-03-05 DIAGNOSIS — G9341 Metabolic encephalopathy: Secondary | ICD-10-CM | POA: Diagnosis present

## 2017-03-05 DIAGNOSIS — E876 Hypokalemia: Secondary | ICD-10-CM | POA: Diagnosis not present

## 2017-03-05 DIAGNOSIS — J181 Lobar pneumonia, unspecified organism: Secondary | ICD-10-CM | POA: Diagnosis not present

## 2017-03-05 DIAGNOSIS — D631 Anemia in chronic kidney disease: Secondary | ICD-10-CM | POA: Diagnosis present

## 2017-03-05 DIAGNOSIS — E870 Hyperosmolality and hypernatremia: Secondary | ICD-10-CM | POA: Diagnosis not present

## 2017-03-05 DIAGNOSIS — E0590 Thyrotoxicosis, unspecified without thyrotoxic crisis or storm: Secondary | ICD-10-CM | POA: Diagnosis not present

## 2017-03-05 DIAGNOSIS — J9600 Acute respiratory failure, unspecified whether with hypoxia or hypercapnia: Secondary | ICD-10-CM | POA: Diagnosis not present

## 2017-03-05 DIAGNOSIS — E669 Obesity, unspecified: Secondary | ICD-10-CM | POA: Diagnosis present

## 2017-03-05 DIAGNOSIS — K056 Periodontal disease, unspecified: Secondary | ICD-10-CM | POA: Diagnosis present

## 2017-03-05 DIAGNOSIS — J690 Pneumonitis due to inhalation of food and vomit: Secondary | ICD-10-CM | POA: Diagnosis not present

## 2017-03-05 DIAGNOSIS — M7989 Other specified soft tissue disorders: Secondary | ICD-10-CM | POA: Diagnosis not present

## 2017-03-05 DIAGNOSIS — R778 Other specified abnormalities of plasma proteins: Secondary | ICD-10-CM | POA: Diagnosis present

## 2017-03-05 DIAGNOSIS — E118 Type 2 diabetes mellitus with unspecified complications: Secondary | ICD-10-CM | POA: Diagnosis not present

## 2017-03-05 DIAGNOSIS — Z87891 Personal history of nicotine dependence: Secondary | ICD-10-CM | POA: Diagnosis not present

## 2017-03-05 DIAGNOSIS — E119 Type 2 diabetes mellitus without complications: Secondary | ICD-10-CM

## 2017-03-05 DIAGNOSIS — N179 Acute kidney failure, unspecified: Secondary | ICD-10-CM

## 2017-03-05 DIAGNOSIS — Z430 Encounter for attention to tracheostomy: Secondary | ICD-10-CM

## 2017-03-05 DIAGNOSIS — R7881 Bacteremia: Secondary | ICD-10-CM | POA: Diagnosis not present

## 2017-03-05 DIAGNOSIS — R531 Weakness: Secondary | ICD-10-CM

## 2017-03-05 DIAGNOSIS — E8770 Fluid overload, unspecified: Secondary | ICD-10-CM | POA: Diagnosis not present

## 2017-03-05 DIAGNOSIS — Z6838 Body mass index (BMI) 38.0-38.9, adult: Secondary | ICD-10-CM

## 2017-03-05 DIAGNOSIS — E2740 Unspecified adrenocortical insufficiency: Secondary | ICD-10-CM | POA: Diagnosis present

## 2017-03-05 DIAGNOSIS — Z79899 Other long term (current) drug therapy: Secondary | ICD-10-CM

## 2017-03-05 DIAGNOSIS — E785 Hyperlipidemia, unspecified: Secondary | ICD-10-CM | POA: Diagnosis present

## 2017-03-05 DIAGNOSIS — R0902 Hypoxemia: Secondary | ICD-10-CM

## 2017-03-05 DIAGNOSIS — K5900 Constipation, unspecified: Secondary | ICD-10-CM | POA: Diagnosis not present

## 2017-03-05 LAB — URINALYSIS, ROUTINE W REFLEX MICROSCOPIC
Bacteria, UA: NONE SEEN
Bilirubin Urine: NEGATIVE
Glucose, UA: >=500 mg/dL — AB
KETONES UR: NEGATIVE mg/dL
Leukocytes, UA: NEGATIVE
Nitrite: NEGATIVE
SPECIFIC GRAVITY, URINE: 1.029 (ref 1.005–1.030)
Squamous Epithelial / LPF: NONE SEEN
pH: 5 (ref 5.0–8.0)

## 2017-03-05 LAB — CBC WITH DIFFERENTIAL/PLATELET
BASOS ABS: 0 10*3/uL (ref 0.0–0.1)
BASOS PCT: 0 %
EOS ABS: 0.1 10*3/uL (ref 0.0–0.7)
EOS PCT: 2 %
HCT: 56.2 % — ABNORMAL HIGH (ref 39.0–52.0)
Hemoglobin: 18.9 g/dL — ABNORMAL HIGH (ref 13.0–17.0)
LYMPHS PCT: 26 %
Lymphs Abs: 2.2 10*3/uL (ref 0.7–4.0)
MCH: 30.4 pg (ref 26.0–34.0)
MCHC: 33.6 g/dL (ref 30.0–36.0)
MCV: 90.5 fL (ref 78.0–100.0)
Monocytes Absolute: 0.7 10*3/uL (ref 0.1–1.0)
Monocytes Relative: 8 %
Neutro Abs: 5.6 10*3/uL (ref 1.7–7.7)
Neutrophils Relative %: 65 %
PLATELETS: 202 10*3/uL (ref 150–400)
RBC: 6.21 MIL/uL — AB (ref 4.22–5.81)
RDW: 15.7 % — ABNORMAL HIGH (ref 11.5–15.5)
WBC: 8.6 10*3/uL (ref 4.0–10.5)

## 2017-03-05 LAB — COMPREHENSIVE METABOLIC PANEL
ALT: 38 U/L (ref 17–63)
AST: 30 U/L (ref 15–41)
Albumin: 3.1 g/dL — ABNORMAL LOW (ref 3.5–5.0)
Alkaline Phosphatase: 154 U/L — ABNORMAL HIGH (ref 38–126)
Anion gap: 8 (ref 5–15)
BUN: 18 mg/dL (ref 6–20)
CHLORIDE: 108 mmol/L (ref 101–111)
CO2: 23 mmol/L (ref 22–32)
Calcium: 8.7 mg/dL — ABNORMAL LOW (ref 8.9–10.3)
Creatinine, Ser: 1.23 mg/dL (ref 0.61–1.24)
GFR calc Af Amer: >60 mL/min (ref >60)
GFR calc non Af Amer: >60 mL/min (ref >60)
Glucose, Bld: 305 mg/dL — ABNORMAL HIGH (ref 65–99)
Potassium: 4.5 mmol/L (ref 3.5–5.1)
Sodium: 139 mmol/L (ref 135–145)
Total Bilirubin: 1 mg/dL (ref 0.3–1.2)
Total Protein: 7.2 g/dL (ref 6.5–8.1)

## 2017-03-05 LAB — APTT

## 2017-03-05 LAB — RAPID URINE DRUG SCREEN, HOSP PERFORMED
AMPHETAMINES: NOT DETECTED
BARBITURATES: NOT DETECTED
Benzodiazepines: NOT DETECTED
COCAINE: NOT DETECTED
Opiates: NOT DETECTED
TETRAHYDROCANNABINOL: NOT DETECTED

## 2017-03-05 LAB — ETHANOL

## 2017-03-05 LAB — GLUCOSE, CAPILLARY
GLUCOSE-CAPILLARY: 274 mg/dL — AB (ref 65–99)
GLUCOSE-CAPILLARY: 273 mg/dL — AB (ref 65–99)
GLUCOSE-CAPILLARY: 266 mg/dL — AB (ref 65–99)
Glucose-Capillary: 264 mg/dL — ABNORMAL HIGH (ref 65–99)

## 2017-03-05 LAB — PROTIME-INR
INR: 0.99
Prothrombin Time: 13 seconds (ref 11.4–15.2)

## 2017-03-05 LAB — I-STAT TROPONIN, ED: Troponin i, poc: 0.25 ng/mL (ref 0.00–0.08)

## 2017-03-05 MED ORDER — METRONIDAZOLE IN NACL 5-0.79 MG/ML-% IV SOLN
500.0000 mg | Freq: Three times a day (TID) | INTRAVENOUS | Status: DC
  Administered 2017-03-05 – 2017-03-16 (×32): 500 mg via INTRAVENOUS
  Filled 2017-03-05 (×35): qty 100

## 2017-03-05 MED ORDER — INSULIN GLARGINE 100 UNIT/ML ~~LOC~~ SOLN
10.0000 [IU] | Freq: Every day | SUBCUTANEOUS | Status: DC
  Administered 2017-03-05 – 2017-03-09 (×5): 10 [IU] via SUBCUTANEOUS
  Filled 2017-03-05 (×9): qty 0.1

## 2017-03-05 MED ORDER — ONDANSETRON HCL 4 MG PO TABS: 4.0000 mg | ORAL_TABLET | Freq: Four times a day (QID) | ORAL | Status: DC | PRN

## 2017-03-05 MED ORDER — VANCOMYCIN HCL 10 G IV SOLR
2000.0000 mg | Freq: Once | INTRAVENOUS | Status: AC
  Administered 2017-03-05: 2000 mg via INTRAVENOUS
  Filled 2017-03-05: qty 2000

## 2017-03-05 MED ORDER — GADOBENATE DIMEGLUMINE 529 MG/ML IV SOLN
20.0000 mL | Freq: Once | INTRAVENOUS | Status: AC | PRN
Start: 2017-03-05 — End: 2017-03-05
  Administered 2017-03-05: 20 mL via INTRAVENOUS

## 2017-03-05 MED ORDER — HALOPERIDOL LACTATE 5 MG/ML IJ SOLN
5.0000 mg | Freq: Once | INTRAMUSCULAR | Status: AC
  Administered 2017-03-05: 5 mg via INTRAMUSCULAR
  Filled 2017-03-05: qty 1

## 2017-03-05 MED ORDER — BACITRACIN-NEOMYCIN-POLYMYXIN 400-5-5000 EX OINT
TOPICAL_OINTMENT | CUTANEOUS | Status: AC
  Filled 2017-03-05: qty 1

## 2017-03-05 MED ORDER — SODIUM CHLORIDE 0.9 % IV SOLN
INTRAVENOUS | Status: DC
  Administered 2017-03-05 – 2017-03-07 (×2): via INTRAVENOUS

## 2017-03-05 MED ORDER — ACETAMINOPHEN 650 MG RE SUPP
650.0000 mg | Freq: Four times a day (QID) | RECTAL | Status: DC | PRN
  Administered 2017-03-06 – 2017-03-07 (×2): 650 mg via RECTAL
  Filled 2017-03-05 (×3): qty 1

## 2017-03-05 MED ORDER — ACETAMINOPHEN 325 MG PO TABS
650.0000 mg | ORAL_TABLET | Freq: Four times a day (QID) | ORAL | Status: DC | PRN
  Administered 2017-03-09 – 2017-03-13 (×3): 650 mg via ORAL
  Filled 2017-03-05 (×3): qty 2

## 2017-03-05 MED ORDER — INSULIN ASPART 100 UNIT/ML ~~LOC~~ SOLN
0.0000 [IU] | Freq: Three times a day (TID) | SUBCUTANEOUS | Status: DC
  Administered 2017-03-05 – 2017-03-06 (×3): 5 [IU] via SUBCUTANEOUS

## 2017-03-05 MED ORDER — ONDANSETRON HCL 4 MG/2ML IJ SOLN: 4.0000 mg | Freq: Four times a day (QID) | INTRAMUSCULAR | Status: DC | PRN

## 2017-03-05 MED ORDER — ENOXAPARIN SODIUM 40 MG/0.4ML ~~LOC~~ SOLN
40.0000 mg | SUBCUTANEOUS | Status: DC
  Administered 2017-03-05 – 2017-03-06 (×2): 40 mg via SUBCUTANEOUS
  Filled 2017-03-05 (×2): qty 0.4

## 2017-03-05 MED ORDER — ASPIRIN EC 81 MG PO TBEC
81.0000 mg | DELAYED_RELEASE_TABLET | Freq: Every day | ORAL | Status: DC
  Administered 2017-03-05 – 2017-03-10 (×3): 81 mg via ORAL
  Filled 2017-03-05 (×4): qty 1

## 2017-03-05 MED ORDER — VANCOMYCIN HCL IN DEXTROSE 750-5 MG/150ML-% IV SOLN
750.0000 mg | Freq: Two times a day (BID) | INTRAVENOUS | Status: DC
  Administered 2017-03-06: 750 mg via INTRAVENOUS
  Filled 2017-03-05 (×7): qty 150

## 2017-03-05 MED ORDER — LORAZEPAM 2 MG/ML IJ SOLN
1.0000 mg | INTRAMUSCULAR | Status: DC | PRN
  Administered 2017-03-06 – 2017-03-11 (×7): 1 mg via INTRAVENOUS
  Filled 2017-03-05 (×7): qty 1

## 2017-03-05 MED ORDER — HYDRALAZINE HCL 20 MG/ML IJ SOLN
10.0000 mg | INTRAMUSCULAR | Status: DC | PRN
  Administered 2017-03-06 – 2017-03-07 (×2): 10 mg via INTRAVENOUS
  Filled 2017-03-05 (×3): qty 1

## 2017-03-05 MED ORDER — LISINOPRIL 10 MG PO TABS
10.0000 mg | ORAL_TABLET | Freq: Every day | ORAL | Status: DC
  Administered 2017-03-05: 10 mg via ORAL
  Filled 2017-03-05 (×2): qty 1

## 2017-03-05 MED ORDER — DEXTROSE 5 % IV SOLN
2.0000 g | Freq: Two times a day (BID) | INTRAVENOUS | Status: DC
  Administered 2017-03-05 – 2017-03-06 (×2): 2 g via INTRAVENOUS
  Filled 2017-03-05 (×8): qty 2

## 2017-03-05 MED ORDER — LORAZEPAM 2 MG/ML IJ SOLN
2.0000 mg | Freq: Once | INTRAMUSCULAR | Status: AC
  Administered 2017-03-06: 2 mg via INTRAVENOUS
  Filled 2017-03-05: qty 1

## 2017-03-05 NOTE — Progress Notes (Signed)
Code Stroke -  0610 Call time  0620 Beeper time  0615 Exam Started  0620 [facility] Exam Completed in [facility] 0626 GR called

## 2017-03-05 NOTE — ED Notes (Signed)
Family at bedside. 

## 2017-03-05 NOTE — ED Notes (Signed)
Pt returned from MRI °

## 2017-03-05 NOTE — ED Notes (Signed)
Lab in pts room redrawing blood. Blood hemolyzed

## 2017-03-05 NOTE — ED Notes (Signed)
Pts wife became very nasty speaking with staff . Unsure what has caused her to become upset w staff. NT reports wife was very rude when she told her she was going to take him upstairs. Pt has skin tear to right shin obtained at home  Area cleaned and antibiotic oint applied

## 2017-03-05 NOTE — Progress Notes (Addendum)
Pharmacy Antibiotic Note  Timothy NuttingKelvin M Jordan is a 61 y.o. 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  Pharmacy has been consulted for Vancomycin dosing.  Plan: Vancomycin 2000mg  loading dose, then 750 IV every 12 hours.  Goal trough 15-20 mcg/mL.  Also, on Ceftriaxone 2gm IV q12h and metronidazole 500mg  IV q8h F/U cxs and clinical progress Monitor V/S, labs, and levels as indicated  Weight: 238 lb 1.6 oz (108 kg)  Temp (24hrs), Avg:99.1 F (37.3 C), Min:98.3 F (36.8 C), Max:100.5 F (38.1 C)   Recent Labs Lab 03/05/17 0620 03/05/17 0815  WBC  --  8.6  CREATININE 1.23  --     CrCl cannot be calculated (Unknown ideal weight.).   Normalized  CrCl 7169mls/min No Known Allergies  Antimicrobials this admission: Vancomycin 10/26 >> Ceftriaxone 10/26>>   Dose adjustments this admission: n/a  Microbiology results: 10/26 BCx: pending  Thank you for allowing pharmacy to be a part of this patient's care.  Timothy CyphersLorie Braycen Jordan, BS Pharm D, [place] Clinical Pharmacist Pager [phone] 03/05/2017 10:44 AM

## 2017-03-05 NOTE — ED Triage Notes (Signed)
Pt arrives via ems from home.  Pt got up at 0300 today to go to the bathroom and then lay down on the couch.  Wife reports she next saw patient at [id number] and he was slurring his speech and not acting normal.

## 2017-03-05 NOTE — H&P (Addendum)
.  History and Physical    Timothy Jordan:[id number] DOB: 03-06-1956 DOA: 03/05/2017  PCP: Patient, No Pcp Per   Patient coming from: Home  Chief Complaint: Altered mental status  HPI: Timothy Jordan is a 61 y.o. male with medical history significant of diabetes mellitus type 2 and hypertension who presents with altered mentation. For last 48 hours patient has been developing progressive confusion and disorientation, to the point where he was noted to have dis-balance, falls and hallucinations. His confusion is moderate to severe in intensity, it was associated with slurred speech and right upper extremity weakness, no fevers or chills, no improving or worsening factors, persistent in nature.   About a month ago he underwent teeth extraction 2, apparently due to poor hygiene and periodontal infection. He received prophylactic antibiotics.   Most of the history obtained from his wife at the bedside, patient unable to keep details of his current complaints. No history of IV drug abuse or illegal substance abuse.  ED Course: Patient was workup as a code stroke, further brain imaging showed brain absence. Patient referred for admission and further treatment.   Review of Systems:  1. General: No fevers, no chills, no weight gain or weight loss, positive somnolence. 2. ENT: No runny nose or sore throat, no hearing disturbances 3. Pulmonary: No dyspnea, cough, wheezing, or hemoptysis 4. Cardiovascular: No angina, claudication, lower extremity edema, pnd or orthopnea 5. Gastrointestinal: No nausea, 1 episode of vomiting, but no diarrhea or constipation 6. Hematology: No easy bruisability or frequent infections 7. Urology: No dysuria, hematuria or increased urinary frequency 8. Dermatology: No rashes. 9. Neurology: No seizures or paresthesias, positive altered mentation has history of present illness.  10. Musculoskeletal: No joint pain or deformities  Past Medical History:  Diagnosis  Date  . Diabetes mellitus without complication [facility])     Past Surgical History:  Procedure Laterality Date  . ACHILLES TENDON REPAIR    . HERNIA REPAIR       reports that he has quit smoking. He has never used smokeless tobacco. He reports that he does not drink alcohol. His drug history is not on file.  No Known Allergies  No family history on file.  Family history was reviewed and found to be noncontributory  Prior to Admission medications   Medication Sig Start Date End Date Taking? Authorizing Provider  aspirin EC 81 MG tablet Take 81 mg by mouth daily.    [provider]  insulin glargine (LANTUS) 100 UNIT/ML injection Inject 70 Units into the skin every morning.    [provider]  lisinopril (PRINIVIL,ZESTRIL) 10 MG tablet Take 10 mg by mouth daily.    [provider]  metFORMIN (GLUCOPHAGE) 500 MG tablet Take 500 mg by mouth 2 (two) times daily with a meal.    [provider]    Physical Exam: Vitals:   03/05/17 0647  BP: (!) 162/99  Pulse: 82  Resp: (!) 21  Temp: 98.3 F (36.8 C)  TempSrc: Oral  SpO2: 90%    Constitutional: deconditioned and ill looking appering Vitals:   03/05/17 0647  BP: (!) 162/99  Pulse: 82  Resp: (!) 21  Temp: 98.3 F (36.8 C)  TempSrc: Oral  SpO2: 90%   Eyes: PERRL, lids and conjunctivae with mild pallor.  Head normocephalic, nose and ears with no deformities.  ENMT: Mucous membranes are dry. Posterior pharynx clear of any exudate or lesions.Normal dentition. Missing molars in the upper mandible, no signs of  local infection.  Neck: normal, supple, no masses, no thyromegaly Respiratory: clear to auscultation bilaterally, no wheezing, no crackles. Normal respiratory effort. No accessory muscle use.  Cardiovascular: Regular rate and rhythm, no murmurs / rubs / gallops. No extremity edema. 2+ pedal pulses. No carotid bruits.  Abdomen: protuberant, no tenderness, no masses palpated. No  hepatosplenomegaly. Bowel sounds positive.  Musculoskeletal: no clubbing / cyanosis. No joint deformity upper and lower extremities. Good ROM, no contractures. Normal muscle tone.  Skin: no rashes, lesions, ulcers. No induration Neurologic: patient confused and disorientated, able to follow commands but easily distracted, no focal deficit. Normal cranial nerves.     Labs on Admission: I have personally reviewed following labs and imaging studies  CBC: No results for input(s): WBC, NEUTROABS, HGB, HCT, MCV, PLT in the last 168 hours. Basic Metabolic Panel:  Recent Labs Lab 03/05/17 0620  NA 139  K 4.5  CL 108  CO2 23  GLUCOSE 305*  BUN 18  CREATININE 1.23  CALCIUM 8.7*   GFR: CrCl cannot be calculated (Unknown ideal weight.). Liver Function Tests:  Recent Labs Lab 03/05/17 0620  AST 30  ALT 38  ALKPHOS 154*  BILITOT 1.0  PROT 7.2  ALBUMIN 3.1*   No results for input(s): LIPASE, AMYLASE in the last 168 hours. No results for input(s): AMMONIA in the last 168 hours. Coagulation Profile: No results for input(s): INR, PROTIME in the last 168 hours. Cardiac Enzymes: No results for input(s): CKTOTAL, CKMB, CKMBINDEX, TROPONINI in the last 168 hours. BNP (last 3 results) No results for input(s): PROBNP in the last 8760 hours. HbA1C: No results for input(s): HGBA1C in the last 72 hours. CBG: No results for input(s): GLUCAP in the last 168 hours. Lipid Profile: No results for input(s): CHOL, HDL, LDLCALC, TRIG, CHOLHDL, LDLDIRECT in the last 72 hours. Thyroid Function Tests: No results for input(s): TSH, T4TOTAL, FREET4, T3FREE, THYROIDAB in the last 72 hours. Anemia Panel: No results for input(s): VITAMINB12, FOLATE, FERRITIN, TIBC, IRON, RETICCTPCT in the last 72 hours. Urine analysis: No results found for: COLORURINE, APPEARANCEUR, LABSPEC, PHURINE, GLUCOSEU, HGBUR, BILIRUBINUR, KETONESUR, PROTEINUR, UROBILINOGEN, NITRITE, LEUKOCYTESUR  Radiological Exams on  Admission: Mr Laqueta Jean And Wo Contrast  Result Date: 03/05/2017 CLINICAL DATA:  Slurred speech. Acute presentation with right-sided weakness. Slurred speech. Abnormal behavior. History of diabetes. EXAM: MRI HEAD WITHOUT AND WITH CONTRAST TECHNIQUE: Multiplanar, multiecho pulse sequences of the brain and surrounding structures were obtained without and with intravenous contrast. CONTRAST:  20mL MULTIHANCE GADOBENATE DIMEGLUMINE 529 MG/ML IV SOLN COMPARISON:  CT earlier same day FINDINGS: Brain: Background pattern of chronic small vessel ischemic change throughout the pons in the cerebral hemispheric white matter. Two ring-enhancing lesions in the left basal ganglia, adjacent and probably in communication. Together these measure about 4.5 cm in length with a transverse diameter of each component about 1.8 x 2 cm. Central nonenhancing material showing pronounced restricted diffusion. Surrounding vasogenic edema. Findings are most consistent with deep brain abscesses. No other abnormal enhancement. Mild mass effect with 1 or 2 mm of left-to-right shift. No hydrocephalus. No extra-axial collection. Vascular: Major vessels at the base of the brain show flow. Skull and upper cervical spine: Negative Sinuses/Orbits: Clear sinuses except for insignificant retention cyst. Orbits negative. Other: None IMPRESSION: Two adjacent in probably communicating lesions in the left basal ganglia most consistent with brain abscess. In total, these measure approximately 4.5 x 1.8 x 2.0 cm. Electronically Signed   By: Paulina Fusi M.D.   On: 03/05/2017  07:50   Ct Head Code Stroke Wo Contrast  Result Date: 03/05/2017 CLINICAL DATA:  Code stroke.  RIGHT-sided weakness. EXAM: CT HEAD WITHOUT CONTRAST TECHNIQUE: Contiguous axial images were obtained from the base of the skull through the vertex without intravenous contrast. COMPARISON:  None. FINDINGS: BRAIN: 2 cystic masses LEFT basal ganglia measuring 1.5 x 2.3 cm and 1.4 x 2.4 cm  with surrounding low-density vasogenic edema. Confluent supratentorial white matter hypodensities. No acute large vascular territory infarct. No hydrocephalus. No midline shift. No abnormal extra-axial fluid collections. VASCULAR: Unremarkable. SKULL/SOFT TISSUES: No skull fracture. No significant soft tissue swelling. ORBITS/SINUSES: The included ocular globes and orbital contents are nonacute. Proptosis. Prominent bilateral superior ophthalmic veins. The mastoid aircells and included paranasal sinuses are well-aerated. OTHER: None. IMPRESSION: 1. Two cystic masses LEFT basal ganglia with surrounding vasogenic edema. Differential diagnosis includes infection/abscess, metastasis, less likely subacute hematomas. Recommend MRI of the brain with contrast. 2. Severe white matter changes, as seen with HIV/PML, chronic small vessel ischemic disease, prior chemo radiation. 3. Critical Value/emergent results were called by telephone at the time of interpretation on 03/05/2017 at 6:30 am to Dr. Devoria Albe , who verbally acknowledged these results. Electronically Signed   By: Awilda Metro M.D.   On: 03/05/2017 06:34    EKG: Independently reviewed. Sinus rhythm, right axis deviation, right bundle branch block.  Assessment/Plan Active Problems:   Brain abscess  61 year old male who presented with altered mentation over last 48 hours, progressive and worsening. He had a teeth extraction x2, about a month ago due to periodontal disease. On initial physical examination his temperature is 98.9, blood pressure 186/104 and 166/84, heart rate 95, respiratory rate 15, oxygen saturation 91%. Dry mucous membranes, lungs clear to auscultation bilaterally, heart S1-S2 present rhythmic, no gallops, rubs or murmurs, abdomen soft nontender, no lower extremity edema. No rashes. Sodium 139, potassium 4.5, chloride 108, bicarbonate 23, glucose 305, BUN 8, creatinine 1.3, calcium 8.7, AST 30, ALT 38, total bilirubin is 1.0, troponin  0.25, white count 8.6, hemoglobin 18.9, hematocrit 56.2, platelets 202. Urinalysis greater than 500 glucose, greater than 300 protein, 0-5 white cells. Alcohol level less than 10, urine drug screen negative. Head CT with no hemorrhage or mass effect, portion of the paranasal sinuses clear. Brain MRI with 2 adjacent in probably communication lesions in the left basal ganglia most consistent with brain abscesses. Total measuring [phone] cm. With positive surrounding vasogenic edema, mild mass effect with 1 or 2 mm of left-to-right shift, with no hydrocephalus.  Patient will be admitted to the hospital working diagnosis of encephalopathy due to brain abscesses.  1. Brain abscesses. Patient will be admitted to the medical ward, he will be placed on a remote telemetry monitor, neurochecks every 4 hours, broad spectrum antibiotics with intravenous vancomycin, ceftriaxone and metronidazole, CNS dosing. Follow-up on blood cultures. Suspected source of infection likely his prior periodontal disease. Will check echocardiogram and HIV.   I spoke with Dr. Lovell Sheehan from neurosurgery, recommendations to continue IV antibiotic therapy, with close neurologic follow-up, no surgery indicated at this point but will need further radiographic surveillance within the next 48 hours. Will transfer to [facility], telemetry unit.  2. Hypertension. Continue antihypertensive agent with lisinopril 20 mg daily. Patient will be placed on IV fluids, isotonic saline. As needed hydralazine 10 mg, every 4 hours for blood pressure systolic greater than 180.  3. Type 2 diabetes mellitus. Patient will be placed on insulin sliding scale for glucose coverage and monitoring, hold metformin  for now. Due to encephalopathy and likely poor oral intake will use a reduced dose of glargine, at home he used 70 units, will start with 10 units and will continue to calculate insulin requirements.  4. Dyslipidemia. Continue Lipitor 40 mg  daily    DVT prophylaxis: enoxaparin Code Status: full  Family Communication: I spoke with patient's wife at the bedside and all questions were addressed.  Disposition Plan: Home  Consults called:   Admission status: Inpatient.     Mauricio Annett Gulaaniel Arrien MD [facility] Pager [phone]  If 7PM-7AM, please contact night-coverage [email or web address] Password [email or web address]  03/05/2017, 8:03 AM

## 2017-03-05 NOTE — Progress Notes (Signed)
Patient CBG=273--No coverage.

## 2017-03-05 NOTE — Progress Notes (Signed)
[facility].

## 2017-03-05 NOTE — ED Notes (Signed)
Code Stroke paged @ [phone]. SOC initiated @ [phone]. Patient sent to CT.

## 2017-03-05 NOTE — ED Notes (Addendum)
Pt sitting up in bed. Confused, having difficulty answering questions.  Unable to identify wife and call her by name

## 2017-03-05 NOTE — ED Notes (Signed)
Pt to MRI Dr Lynelle DoctorKnapp cancels code stoke. When pt arrived in ed pt was able to follow commands with some promoting pt has some weakness in his right hand pt had equal strength in his lower ext. Pt could answer questions correct other than location. When the pt returned from ct pt was not able to answer questions or he would not be able to follow commands pt went to ct at [phone].

## 2017-03-05 NOTE — Progress Notes (Signed)
[facility] completed, VS documented, patient oriented to self only.  Placed safety sitter in the room, family at bedside, MD notified of combative and confused behavior.  Unable to restart piv at this time due to agitation.

## 2017-03-05 NOTE — ED Provider Notes (Signed)
[facility] EMERGENCY DEPARTMENT Provider Note   CSN: [id number] Arrival date & time: 03/05/17  0617   Pt seen on Arrival 06:10 AM  History   Chief Complaint Chief Complaint  Patient presents with  . Code Stroke   Level 5 caveat due to need for urgent intervention  HPI BENUEL LY is a 61 y.o. male.  HPI per EMS patient was fine at 3 AM when he got up to use the bathroom.  His wife found him per EMS around 5:45 AM with slurred speech on the couch.  They report he had right upper extremity weakness and a right facial droop and his speech was very slurred on their exam.  They report his initial blood pressure was 220/120 and his CBG was 350.  Past Medical History:  Diagnosis Date  . Diabetes mellitus without complication [facility])     Patient Active Problem List   Diagnosis Date Noted  . Brain abscess 03/05/2017    Past Surgical History:  Procedure Laterality Date  . ACHILLES TENDON REPAIR    . HERNIA REPAIR         Home Medications    Prior to Admission medications   Medication Sig Start Date End Date Taking? Authorizing Provider  aspirin EC 81 MG tablet Take 81 mg by mouth daily.    [provider]  insulin glargine (LANTUS) 100 UNIT/ML injection Inject 70 Units into the skin every morning.    [provider]  lisinopril (PRINIVIL,ZESTRIL) 10 MG tablet Take 10 mg by mouth daily.    [provider]  metFORMIN (GLUCOPHAGE) 500 MG tablet Take 500 mg by mouth 2 (two) times daily with a meal.    [provider]    Family History No family history on file.  Social History Social History  Substance Use Topics  . Smoking status: Former Games developer  . Smokeless tobacco: Never Used  . Alcohol use No  lives at home Lives with spouse   Allergies   Patient has no known allergies.   Review of Systems Review of Systems  Unable to perform ROS: Acuity of condition     Physical Exam Updated Vital Signs BP (!) 162/99 (BP  Location: Right Arm)   Pulse 82   Temp 98.3 F (36.8 C) (Oral)   Resp (!) 21   SpO2 90%   Vital signs normal    Physical Exam  Constitutional: He appears well-developed and well-nourished.  HENT:  Head: Normocephalic.  Right Ear: External ear normal.  Left Ear: External ear normal.  Nose: Nose normal.  Eyes: Pupils are equal, round, and reactive to light. Conjunctivae and EOM are normal.  Neck: Normal range of motion. Neck supple.  Cardiovascular: Normal rate and regular rhythm.   Pulmonary/Chest: Effort normal.  Abdominal: Soft. Bowel sounds are normal. He exhibits no distension and no mass. There is no tenderness. There is no guarding.  Neurological: He is alert. A cranial nerve deficit is present.  Patient has a right facial droop  Patient has a right grip that is weak compared to the left  Patient is able to lift both upper extremities up against gravity but has difficulty on the right  Nursing note and vitals reviewed.    ED Treatments / Results  Labs (all labs ordered are listed, but only abnormal results are displayed) Results for orders placed or performed during the hospital encounter of 03/05/17  Ethanol  Result Value Ref Range   Alcohol, Ethyl (B) <10 <10 mg/dL  Comprehensive metabolic panel  Result Value Ref Range   Sodium 139 135 - 145 mmol/L   Potassium 4.5 3.5 - 5.1 mmol/L   Chloride 108 101 - 111 mmol/L   CO2 23 22 - 32 mmol/L   Glucose, Bld 305 (H) 65 - 99 mg/dL   BUN 18 6 - 20 mg/dL   Creatinine, Ser 1.611.23 0.61 - 1.24 mg/dL   Calcium 8.7 (L) 8.9 - 10.3 mg/dL   Total Protein 7.2 6.5 - 8.1 g/dL   Albumin 3.1 (L) 3.5 - 5.0 g/dL   AST 30 15 - 41 U/L   ALT 38 17 - 63 U/L   Alkaline Phosphatase 154 (H) 38 - 126 U/L   Total Bilirubin 1.0 0.3 - 1.2 mg/dL   GFR calc non Af Amer >60 >60 mL/min   GFR calc Af Amer >60 >60 mL/min   Anion gap 8 5 - 15  I-stat troponin, ED  Result Value Ref Range   Troponin i, poc 0.25 (HH) 0.00 - 0.08 ng/mL   Comment  NOTIFIED PHYSICIAN    Comment 3             Laboratory interpretation all normal except troponin, hyperglycemia, CBC pending     EKG  EKG Interpretation  Date/Time:  Friday March 05 2017 06:30:46 EDT Ventricular Rate:  85 PR Interval:    QRS Duration: 111 QT Interval:  406 QTC Calculation: 483 R Axis:   -157 Text Interpretation:  Sinus rhythm Probable left atrial enlargement Right axis deviation Abnormal R-wave progression, late transition Borderline prolonged QT interval No old tracing to compare Confirmed by Devoria AlbeKnapp, Vandora Jaskulski ([id number]) on 03/05/2017 6:34:14 AM       Radiology Mr Brain W And Wo Contrast  Result Date: 03/05/2017 CLINICAL DATA:  Slurred speech. Acute presentation with right-sided weakness. Slurred speech. Abnormal behavior. History of diabetes. EXAM: MRI HEAD WITHOUT AND WITH CONTRAST TECHNIQUE: Multiplanar, multiecho pulse sequences of the brain and surrounding structures were obtained without and with intravenous contrast. CONTRAST:  20mL MULTIHANCE GADOBENATE DIMEGLUMINE 529 MG/ML IV SOLN COMPARISON:  CT earlier same day FINDINGS: Brain: Background pattern of chronic small vessel ischemic change throughout the pons in the cerebral hemispheric white matter. Two ring-enhancing lesions in the left basal ganglia, adjacent and probably in communication. Together these measure about 4.5 cm in length with a transverse diameter of each component about 1.8 x 2 cm. Central nonenhancing material showing pronounced restricted diffusion. Surrounding vasogenic edema. Findings are most consistent with deep brain abscesses. No other abnormal enhancement. Mild mass effect with 1 or 2 mm of left-to-right shift. No hydrocephalus. No extra-axial collection. Vascular: Major vessels at the base of the brain show flow. Skull and upper cervical spine: Negative Sinuses/Orbits: Clear sinuses except for insignificant retention cyst. Orbits negative. Other: None IMPRESSION: Two adjacent in probably  communicating lesions in the left basal ganglia most consistent with brain abscess. In total, these measure approximately 4.5 x 1.8 x 2.0 cm. Electronically Signed   By: Paulina FusiMark  Shogry M.D.   On: 03/05/2017 07:50   Ct Head Code Stroke Wo Contrast  Result Date: 03/05/2017 CLINICAL DATA:  Code stroke.  RIGHT-sided weakness. EXAM: CT HEAD WITHOUT CONTRAST TECHNIQUE: Contiguous axial images were obtained from the base of the skull through the vertex without intravenous contrast. COMPARISON:  None. FINDINGS: BRAIN: 2 cystic masses LEFT basal ganglia measuring 1.5 x 2.3 cm and 1.4 x 2.4 cm with surrounding low-density vasogenic edema. Confluent supratentorial white matter hypodensities. No acute large vascular territory  infarct. No hydrocephalus. No midline shift. No abnormal extra-axial fluid collections. VASCULAR: Unremarkable. SKULL/SOFT TISSUES: No skull fracture. No significant soft tissue swelling. ORBITS/SINUSES: The included ocular globes and orbital contents are nonacute. Proptosis. Prominent bilateral superior ophthalmic veins. The mastoid aircells and included paranasal sinuses are well-aerated. OTHER: None. IMPRESSION: 1. Two cystic masses LEFT basal ganglia with surrounding vasogenic edema. Differential diagnosis includes infection/abscess, metastasis, less likely subacute hematomas. Recommend MRI of the brain with contrast. 2. Severe white matter changes, as seen with HIV/PML, chronic small vessel ischemic disease, prior chemo radiation. 3. Critical Value/emergent results were called by telephone at the time of interpretation on 03/05/2017 at 6:30 am to Dr. Devoria Albe , who verbally acknowledged these results. Electronically Signed   By: Awilda Metro M.D.   On: 03/05/2017 06:34    Procedures .Critical Care Performed by: Lynelle Doctor Jaeli Grubb Authorized by: Devoria Albe   Critical care provider statement:    Critical care time (minutes):  39   Critical care was necessary to treat or prevent imminent or  life-threatening deterioration of the following conditions:  CNS failure or compromise   Critical care was time spent personally by me on the following activities:  Discussions with consultants, examination of patient, obtaining history from patient or surrogate, ordering and review of laboratory studies, ordering and review of radiographic studies and re-evaluation of patient's condition   (including critical care time)  Medications Ordered in ED Medications  gadobenate dimeglumine (MULTIHANCE) injection 20 mL (20 mLs Intravenous Contrast Given 03/05/17 0712)     Initial Impression / Assessment and Plan / ED Course  I have reviewed the triage vital signs and the nursing notes.  Pertinent labs & imaging results that were available during my care of the patient were reviewed by me and considered in my medical decision making (see chart for details).     06:30 AM Radiologist called CT report  6:35 AM I went into the room, wife is now in the room and the [facility] neurologist is examining the patient.  Wife states that her son noticed patient was having slurred speech at a game last night.  She did not talk to him much and said he was able to eat dinner okay.  She states at 3:00 he seemed confused and then at 5 in the morning he seemed worse which is why she called EMS.  06:55 AM [facility] Neurologist, is through with her evaluation. Will send report to chart.  Per her the family has been having to take care of the patient over a period of time and he has been complaining of headaches and having a lot of sleepiness.  7 AM code stroke was canceled.  TPA was not initiated.  HIV test was ordered.   07:55 AM Dr Ella Jubilee, hospitalist, will admit.  Final Clinical Impressions(s) / ED Diagnoses   Final diagnoses:  Right arm weakness  Weakness on right side of face  Brain abscess  Dysarthria  Confusion   Plan admission  Devoria Albe, MD, Concha Pyo, MD 03/05/17 0800

## 2017-03-05 NOTE — ED Notes (Signed)
Date and time results received: 03/05/17  0645 (use smartphrase ".now" to insert current time)  Test: trop Critical Value: 0.25  Name of Provider Notified: dr Lynelle Doctorknapp  Orders Received? Or Actions Taken?: none

## 2017-03-06 ENCOUNTER — Inpatient Hospital Stay (HOSPITAL_COMMUNITY)

## 2017-03-06 DIAGNOSIS — E119 Type 2 diabetes mellitus without complications: Secondary | ICD-10-CM

## 2017-03-06 DIAGNOSIS — R7881 Bacteremia: Secondary | ICD-10-CM

## 2017-03-06 DIAGNOSIS — R778 Other specified abnormalities of plasma proteins: Secondary | ICD-10-CM | POA: Diagnosis present

## 2017-03-06 DIAGNOSIS — R7989 Other specified abnormal findings of blood chemistry: Secondary | ICD-10-CM

## 2017-03-06 DIAGNOSIS — G9341 Metabolic encephalopathy: Secondary | ICD-10-CM | POA: Diagnosis present

## 2017-03-06 DIAGNOSIS — I10 Essential (primary) hypertension: Secondary | ICD-10-CM | POA: Diagnosis present

## 2017-03-06 LAB — ECHOCARDIOGRAM LIMITED
FS: 21 % — AB (ref 28–44)
HEIGHTINCHES: 66 in
IVS/LV PW RATIO, ED: 1
LA ID, A-P, ES: 44 mm
LA diam end sys: 44 mm
LA diam index: 2.05 cm/m2
LDCA: 3.46 cm2
LV PW d: 13.5 mm — AB (ref 0.6–1.1)
LVOT diameter: 21 mm
WEIGHTICAEL: 3809.55 [oz_av]

## 2017-03-06 LAB — TROPONIN I
Troponin I: 0.46 ng/mL (ref <0.03)
Troponin I: 0.5 ng/mL (ref <0.03)
Troponin I: 0.51 ng/mL (ref <0.03)

## 2017-03-06 LAB — HIV ANTIBODY (ROUTINE TESTING W REFLEX): HIV Screen 4th Generation wRfx: NONREACTIVE

## 2017-03-06 LAB — GLUCOSE, CAPILLARY
GLUCOSE-CAPILLARY: 268 mg/dL — AB (ref 65–99)
GLUCOSE-CAPILLARY: 239 mg/dL — AB (ref 65–99)
GLUCOSE-CAPILLARY: 223 mg/dL — AB (ref 65–99)
GLUCOSE-CAPILLARY: 223 mg/dL — AB (ref 65–99)

## 2017-03-06 LAB — HEMOGLOBIN A1C
Hgb A1c MFr Bld: 9.9 % — ABNORMAL HIGH (ref 4.8–5.6)
Mean Plasma Glucose: 237.43 mg/dL

## 2017-03-06 MED ORDER — HALOPERIDOL LACTATE 5 MG/ML IJ SOLN
1.0000 mg | Freq: Once | INTRAMUSCULAR | Status: AC
  Administered 2017-03-06: 1 mg via INTRAVENOUS
  Filled 2017-03-06: qty 1

## 2017-03-06 MED ORDER — DEXTROSE 5 % IV SOLN
2.0000 g | Freq: Two times a day (BID) | INTRAVENOUS | Status: DC
  Administered 2017-03-06 – 2017-03-22 (×33): 2 g via INTRAVENOUS
  Filled 2017-03-06 (×34): qty 2

## 2017-03-06 MED ORDER — INSULIN ASPART 100 UNIT/ML ~~LOC~~ SOLN
0.0000 [IU] | SUBCUTANEOUS | Status: DC
  Administered 2017-03-06 (×3): 3 [IU] via SUBCUTANEOUS
  Administered 2017-03-07 (×2): 2 [IU] via SUBCUTANEOUS
  Administered 2017-03-07: 3 [IU] via SUBCUTANEOUS

## 2017-03-06 MED ORDER — METOPROLOL TARTRATE 5 MG/5ML IV SOLN
5.0000 mg | Freq: Three times a day (TID) | INTRAVENOUS | Status: DC
  Administered 2017-03-06 – 2017-03-07 (×5): 5 mg via INTRAVENOUS
  Filled 2017-03-06 (×6): qty 5

## 2017-03-06 NOTE — Progress Notes (Signed)
[facility]                                                                              Patient Demographics  Timothy Jordan, is a 61 y.o. male, DOB - May 25, 1955, [id number]  Admit date - 03/05/2017   Admitting Physician Mauricio Annett Gula, MD  Outpatient Primary MD for the patient is Patient, No Pcp Per  Outpatient specialists:   LOS - 1  days   Medical records reviewed and are as summarized below:    Chief Complaint  Patient presents with  . Code Stroke       Brief summary   Patient is a 61 year old male with diabetes type 2, hypertension, hyperlipidemia presented with altered mental status.  Patient had a teeth extraction x2 in the last month due to poor hygiene and periodontal infection, received prophylactic antibiotics.  In the last 2 days had developed progressive confusion, to the point where he was having falls, hallucinations and imbalance.  MRI brain showed 2 adjacent and probably communicating lesions in the left basal ganglia most consistent with brain abscesses.   Assessment & Plan    Principal Problem:   Brain abscess in the setting of periodontal disease and recent dental workup -MRI of the brain showed 2 adjacent and probably communicating lesions in the left basal ganglia most consistent with brain abscess, measures 4.5X 1.8X 2.0 cm -Neurosurgery was consulted, recommended empiric treatment with antibiotics, ID to follow, repeat his imaging in few days or sooner if clinically indicated -Continue IV Rocephin, vancomycin and metronidazole -Follow blood cultures -Follow 2D echo to rule out endocarditis -ID consult obtained, discussed with Dr. Luciana Axe   Active Problems:   Acute metabolic encephalopathy -Likely due to #1     Essential hypertension -Currently altered mental status, placed on IV Lopressor scheduled, IV hydralazine as needed    Diabetes mellitus (HCC) -Currently altered mental status, keep sliding scale insulin every  4 hours, Lantus 10 units daily -Diabetic coordinator consult    Elevated troponin -Currently no chest pain, follow troponins, 2D echocardiogram, no prior history of CAD   Code Status: Full CODE STATUS DVT Prophylaxis:  Lovenox Family Communication: Called patient's wife, she did not pick up the phone   Disposition Plan:  Time Spent in minutes   25 minutes  Procedures:  MRI brain  Consultants:   Neurosurgery ID  Antimicrobials:   IV vancomycin 10/26>  IV Flagyl 10/26>  IV Rocephin 10/26>   Medications  Scheduled Meds: . aspirin EC  81 mg Oral Daily  . enoxaparin (LOVENOX) injection  40 mg Subcutaneous Q24H  . insulin aspart  0-9 Units Subcutaneous Q4H  . insulin glargine  10 Units Subcutaneous Daily   Continuous Infusions: . sodium chloride 75 mL/hr at 03/05/17 1121  . cefTRIAXone (ROCEPHIN)  IV Stopped (03/06/17 0138)  . metronidazole Stopped (03/06/17 0407)  . vancomycin Stopped (03/06/17 0207)   PRN Meds:.acetaminophen **OR** acetaminophen, hydrALAZINE, LORazepam, ondansetron **OR** ondansetron (ZOFRAN) IV   Antibiotics   Anti-infectives    Start     Dose/Rate Route Frequency Ordered Stop   03/05/17 2300  vancomycin (VANCOCIN) IVPB 750 mg/150 ml premix  750 mg 150 mL/hr over 60 Minutes Intravenous Every 12 hours 03/05/17 1049     03/05/17 1100  vancomycin (VANCOCIN) 2,000 mg in sodium chloride 0.9 % 500 mL IVPB     2,000 mg 250 mL/hr over 120 Minutes Intravenous  Once 03/05/17 1049 03/05/17 1721   03/05/17 1045  cefTRIAXone (ROCEPHIN) 2 g in dextrose 5 % 50 mL IVPB     2 g 100 mL/hr over 30 Minutes Intravenous Every 12 hours 03/05/17 1038     03/05/17 1045  metroNIDAZOLE (FLAGYL) IVPB 500 mg     500 mg 100 mL/hr over 60 Minutes Intravenous Every 8 hours 03/05/17 1038          Subjective:   Ikenna Bensman was seen and examined today.  Very somnolent, snoring, difficult to arouse.  Low-grade temp of 99.3 F overnight.  Had received Haldol  overnight  Objective:   Vitals:   03/05/17 2058 03/06/17 0321 03/06/17 0355 03/06/17 0420  BP: (!) 183/81  (!) 181/104   Pulse: 85 (!) 114 (!) 102 95  Resp: 20 20  (!) 32  Temp: 99.3 F (37.4 C)  99.1 F (37.3 C)   TempSrc: Oral  Axillary   SpO2: 94% 97%  97%  Weight:      Height:        Intake/Output Summary (Last 24 hours) at 03/06/17 1045 Last data filed at 03/06/17 0655  Gross per 24 hour  Intake          2123.75 ml  Output              600 ml  Net          1523.75 ml     Wt Readings from Last 3 Encounters:  03/05/17 108 kg (238 lb 1.6 oz)  02/19/14 104.8 kg (231 lb)  02/16/14 104.8 kg (231 lb)     Exam  General: Somnolent, snoring  Eyes: PERRLA, EOMI  HEENT:    Cardiovascular: S1 S2 auscultated, no rubs, murmurs or gallops. Regular rate and rhythm.  Respiratory: Clear to auscultation bilaterally, no wheezing, rales or rhonchi  Gastrointestinal: Soft, nontender, nondistended, + bowel sounds  Ext: no pedal edema bilaterally  Neuro:  unable to assess:   Musculoskeletal: No digital cyanosis, clubbing  Skin: No rashes  Psych: somnolent   Data Reviewed:  I have personally reviewed following labs and imaging studies  Micro Results Recent Results (from the past 240 hour(s))  Culture, blood (Routine X 2) w Reflex to ID Panel     Status: None (Preliminary result)   Collection Time: 03/05/17 11:15 AM  Result Value Ref Range Status   Specimen Description BLOOD LEFT ARM  Final   Special Requests   Final    BOTTLES DRAWN AEROBIC AND ANAEROBIC Blood Culture results may not be optimal due to an inadequate volume of blood received in culture bottles   Culture NO GROWTH < 24 HOURS  Final   Report Status PENDING  Incomplete  Culture, blood (Routine X 2) w Reflex to ID Panel     Status: None (Preliminary result)   Collection Time: 03/05/17 11:20 AM  Result Value Ref Range Status   Specimen Description BLOOD RIGHT HAND  Final   Special Requests   Final     BOTTLES DRAWN AEROBIC AND ANAEROBIC Blood Culture results may not be optimal due to an inadequate volume of blood received in culture bottles   Culture NO GROWTH < 24 HOURS  Final   Report Status  PENDING  Incomplete    Radiology Reports Mr Laqueta Jean And Wo Contrast  Result Date: 03/05/2017 CLINICAL DATA:  Slurred speech. Acute presentation with right-sided weakness. Slurred speech. Abnormal behavior. History of diabetes. EXAM: MRI HEAD WITHOUT AND WITH CONTRAST TECHNIQUE: Multiplanar, multiecho pulse sequences of the brain and surrounding structures were obtained without and with intravenous contrast. CONTRAST:  20mL MULTIHANCE GADOBENATE DIMEGLUMINE 529 MG/ML IV SOLN COMPARISON:  CT earlier same day FINDINGS: Brain: Background pattern of chronic small vessel ischemic change throughout the pons in the cerebral hemispheric white matter. Two ring-enhancing lesions in the left basal ganglia, adjacent and probably in communication. Together these measure about 4.5 cm in length with a transverse diameter of each component about 1.8 x 2 cm. Central nonenhancing material showing pronounced restricted diffusion. Surrounding vasogenic edema. Findings are most consistent with deep brain abscesses. No other abnormal enhancement. Mild mass effect with 1 or 2 mm of left-to-right shift. No hydrocephalus. No extra-axial collection. Vascular: Major vessels at the base of the brain show flow. Skull and upper cervical spine: Negative Sinuses/Orbits: Clear sinuses except for insignificant retention cyst. Orbits negative. Other: None IMPRESSION: Two adjacent in probably communicating lesions in the left basal ganglia most consistent with brain abscess. In total, these measure approximately 4.5 x 1.8 x 2.0 cm. Electronically Signed   By: Paulina Fusi M.D.   On: 03/05/2017 07:50   Ct Head Code Stroke Wo Contrast  Result Date: 03/05/2017 CLINICAL DATA:  Code stroke.  RIGHT-sided weakness. EXAM: CT HEAD WITHOUT CONTRAST  TECHNIQUE: Contiguous axial images were obtained from the base of the skull through the vertex without intravenous contrast. COMPARISON:  None. FINDINGS: BRAIN: 2 cystic masses LEFT basal ganglia measuring 1.5 x 2.3 cm and 1.4 x 2.4 cm with surrounding low-density vasogenic edema. Confluent supratentorial white matter hypodensities. No acute large vascular territory infarct. No hydrocephalus. No midline shift. No abnormal extra-axial fluid collections. VASCULAR: Unremarkable. SKULL/SOFT TISSUES: No skull fracture. No significant soft tissue swelling. ORBITS/SINUSES: The included ocular globes and orbital contents are nonacute. Proptosis. Prominent bilateral superior ophthalmic veins. The mastoid aircells and included paranasal sinuses are well-aerated. OTHER: None. IMPRESSION: 1. Two cystic masses LEFT basal ganglia with surrounding vasogenic edema. Differential diagnosis includes infection/abscess, metastasis, less likely subacute hematomas. Recommend MRI of the brain with contrast. 2. Severe white matter changes, as seen with HIV/PML, chronic small vessel ischemic disease, prior chemo radiation. 3. Critical Value/emergent results were called by telephone at the time of interpretation on 03/05/2017 at 6:30 am to Dr. Devoria Albe , who verbally acknowledged these results. Electronically Signed   By: Awilda Metro M.D.   On: 03/05/2017 06:34    Lab Data:  CBC:  Recent Labs Lab 03/05/17 0815  WBC 8.6  NEUTROABS 5.6  HGB 18.9*  HCT 56.2*  MCV 90.5  PLT 202   Basic Metabolic Panel:  Recent Labs Lab 03/05/17 0620  NA 139  K 4.5  CL 108  CO2 23  GLUCOSE 305*  BUN 18  CREATININE 1.23  CALCIUM 8.7*   GFR: Estimated Creatinine Clearance: 74.5 mL/min (by C-G formula based on SCr of 1.23 mg/dL). Liver Function Tests:  Recent Labs Lab 03/05/17 0620  AST 30  ALT 38  ALKPHOS 154*  BILITOT 1.0  PROT 7.2  ALBUMIN 3.1*   No results for input(s): LIPASE, AMYLASE in the last 168 hours. No  results for input(s): AMMONIA in the last 168 hours. Coagulation Profile:  Recent Labs Lab 03/05/17 0815  INR 0.99  Cardiac Enzymes: No results for input(s): CKTOTAL, CKMB, CKMBINDEX, TROPONINI in the last 168 hours. BNP (last 3 results) No results for input(s): PROBNP in the last 8760 hours. HbA1C: No results for input(s): HGBA1C in the last 72 hours. CBG:  Recent Labs Lab 03/05/17 1127 03/05/17 1644 03/05/17 1956 03/05/17 2249 03/06/17 0606  GLUCAP 264* 274* 273* 266* 268*   Lipid Profile: No results for input(s): CHOL, HDL, LDLCALC, TRIG, CHOLHDL, LDLDIRECT in the last 72 hours. Thyroid Function Tests: No results for input(s): TSH, T4TOTAL, FREET4, T3FREE, THYROIDAB in the last 72 hours. Anemia Panel: No results for input(s): VITAMINB12, FOLATE, FERRITIN, TIBC, IRON, RETICCTPCT in the last 72 hours. Urine analysis:    Component Value Date/Time   COLORURINE YELLOW 03/05/2017 0622   APPEARANCEUR CLEAR 03/05/2017 0622   LABSPEC 1.029 03/05/2017 0622   PHURINE 5.0 03/05/2017 0622   GLUCOSEU >=500 (A) 03/05/2017 0622   HGBUR SMALL (A) 03/05/2017 0622   BILIRUBINUR NEGATIVE 03/05/2017 0622   KETONESUR NEGATIVE 03/05/2017 0622   PROTEINUR >=300 (A) 03/05/2017 0622   NITRITE NEGATIVE 03/05/2017 0622   LEUKOCYTESUR NEGATIVE 03/05/2017 0622     Ripudeep Rai M.D. [facility] 03/06/2017, 10:45 AM  Pager: [phone] Between 7am to 7pm - call Pager - [phone]  After 7pm go to [email or web address] - password [email or web address]  Call night coverage person covering after 7pm

## 2017-03-06 NOTE — Progress Notes (Signed)
Lab reports critical lab value for troponin level of 0.46. Dr. Isidoro Donningai page notified.

## 2017-03-06 NOTE — Consult Note (Signed)
Reason for Consult: Brain lesions Referring Physician: Dr. Eual Fines Kellie Simmering is an 61 y.o. male.  HPI: The patient is a 61 year old diabetic black male who had dental work done a few weeks ago. Over the last couple days he's had a decrease in his mental status. He was seen at [facility] and a head CT and brain MRI were obtained. These demonstrated lesions in his left basal ganglia, possibly abscesses. The patient was transferred to [facility] for further care. He has been started on vancomycin and ceftriaxone empirically.  Presently the patient is somnolent but arousable. He will answer simple questions. There is no family members available.  Past Medical History:  Diagnosis Date  . Diabetes mellitus without complication [facility])     Past Surgical History:  Procedure Laterality Date  . ACHILLES TENDON REPAIR    . HERNIA REPAIR      No family history on file.  Social History:  reports that he has quit smoking. He has never used smokeless tobacco. He reports that he does not drink alcohol. His drug history is not on file.  Allergies: No Known Allergies  Medications:  I have reviewed the patient's current medications. Prior to Admission:  Prescriptions Prior to Admission  Medication Sig Dispense Refill Last Dose  . aspirin EC 81 MG tablet Take 81 mg by mouth daily.   03/04/2017 at Unknown time  . insulin glargine (LANTUS) 100 UNIT/ML injection Inject 70 Units into the skin every morning.   03/04/2017 at Unknown time  . LIPITOR 40 MG tablet Take 1 tablet by mouth at bedtime.   03/04/2017 at Unknown time  . lisinopril (PRINIVIL,ZESTRIL) 20 MG tablet Take 1 tablet by mouth daily.   03/04/2017 at Unknown time  . metFORMIN (GLUCOPHAGE) 1000 MG tablet Take 1 tablet by mouth 2 (two) times daily.   03/04/2017 at Unknown time   Scheduled: . aspirin EC  81 mg Oral Daily  . enoxaparin (LOVENOX) injection  40 mg Subcutaneous Q24H  . insulin aspart  0-9 Units Subcutaneous  TID WC  . insulin glargine  10 Units Subcutaneous Daily  . lisinopril  10 mg Oral Daily   Continuous: . sodium chloride 75 mL/hr at 03/05/17 1121  . cefTRIAXone (ROCEPHIN)  IV Stopped (03/06/17 0138)  . metronidazole Stopped (03/06/17 0407)  . vancomycin Stopped (03/06/17 0207)   DVV:[id number] **OR** acetaminophen, hydrALAZINE, LORazepam, ondansetron **OR** ondansetron (ZOFRAN) IV Anti-infectives    Start     Dose/Rate Route Frequency Ordered Stop   03/05/17 2300  vancomycin (VANCOCIN) IVPB 750 mg/150 ml premix     750 mg 150 mL/hr over 60 Minutes Intravenous Every 12 hours 03/05/17 1049     03/05/17 1100  vancomycin (VANCOCIN) 2,000 mg in sodium chloride 0.9 % 500 mL IVPB     2,000 mg 250 mL/hr over 120 Minutes Intravenous  Once 03/05/17 1049 03/05/17 1721   03/05/17 1045  cefTRIAXone (ROCEPHIN) 2 g in dextrose 5 % 50 mL IVPB     2 g 100 mL/hr over 30 Minutes Intravenous Every 12 hours 03/05/17 1038     03/05/17 1045  metroNIDAZOLE (FLAGYL) IVPB 500 mg     500 mg 100 mL/hr over 60 Minutes Intravenous Every 8 hours 03/05/17 1038         Results for orders placed or performed during the hospital encounter of 03/05/17 (from the past 48 hour(s))  Ethanol     Status: None   Collection Time: 03/05/17  6:20 AM  Result Value Ref Range   Alcohol, Ethyl (B) <10 <10 mg/dL    Comment:        LOWEST DETECTABLE LIMIT FOR SERUM ALCOHOL IS 10 mg/dL FOR MEDICAL PURPOSES ONLY   Comprehensive metabolic panel     Status: Abnormal   Collection Time: 03/05/17  6:20 AM  Result Value Ref Range   Sodium 139 135 - 145 mmol/L   Potassium 4.5 3.5 - 5.1 mmol/L   Chloride 108 101 - 111 mmol/L   CO2 23 22 - 32 mmol/L   Glucose, Bld 305 (H) 65 - 99 mg/dL   BUN 18 6 - 20 mg/dL   Creatinine, Ser 1.23 0.61 - 1.24 mg/dL   Calcium 8.7 (L) 8.9 - 10.3 mg/dL   Total Protein 7.2 6.5 - 8.1 g/dL   Albumin 3.1 (L) 3.5 - 5.0 g/dL   AST 30 15 - 41 U/L   ALT 38 17 - 63 U/L   Alkaline Phosphatase 154 (H)  38 - 126 U/L   Total Bilirubin 1.0 0.3 - 1.2 mg/dL   GFR calc non Af Amer >60 >60 mL/min   GFR calc Af Amer >60 >60 mL/min    Comment: (NOTE) The eGFR has been calculated using the CKD EPI equation. This calculation has not been validated in all clinical situations. eGFR's persistently <60 mL/min signify possible Chronic Kidney Disease.    Anion gap 8 5 - 15  Urine rapid drug screen (hosp performed)     Status: None   Collection Time: 03/05/17  6:22 AM  Result Value Ref Range   Opiates NONE DETECTED NONE DETECTED   Cocaine NONE DETECTED NONE DETECTED   Benzodiazepines NONE DETECTED NONE DETECTED   Amphetamines NONE DETECTED NONE DETECTED   Tetrahydrocannabinol NONE DETECTED NONE DETECTED   Barbiturates NONE DETECTED NONE DETECTED    Comment:        DRUG SCREEN FOR MEDICAL PURPOSES ONLY.  IF CONFIRMATION IS NEEDED FOR ANY PURPOSE, NOTIFY LAB WITHIN 5 DAYS.        LOWEST DETECTABLE LIMITS FOR URINE DRUG SCREEN Drug Class       Cutoff (ng/mL) Amphetamine      1000 Barbiturate      200 Benzodiazepine   149 Tricyclics       702 Opiates          300 Cocaine          300 THC              50   Urinalysis, Routine w reflex microscopic     Status: Abnormal   Collection Time: 03/05/17  6:22 AM  Result Value Ref Range   Color, Urine YELLOW YELLOW   APPearance CLEAR CLEAR   Specific Gravity, Urine 1.029 1.005 - 1.030   pH 5.0 5.0 - 8.0   Glucose, UA >=500 (A) NEGATIVE mg/dL   Hgb urine dipstick SMALL (A) NEGATIVE   Bilirubin Urine NEGATIVE NEGATIVE   Ketones, ur NEGATIVE NEGATIVE mg/dL   Protein, ur >=300 (A) NEGATIVE mg/dL   Nitrite NEGATIVE NEGATIVE   Leukocytes, UA NEGATIVE NEGATIVE   RBC / HPF 6-30 0 - 5 RBC/hpf   WBC, UA 0-5 0 - 5 WBC/hpf   Bacteria, UA NONE SEEN NONE SEEN   Squamous Epithelial / LPF NONE SEEN NONE SEEN   Mucus PRESENT    Hyaline Casts, UA PRESENT   I-stat troponin, ED     Status: Abnormal   Collection Time: 03/05/17  6:34 AM  Result Value  Ref Range    Troponin i, poc 0.25 (HH) 0.00 - 0.08 ng/mL   Comment NOTIFIED PHYSICIAN    Comment 3            Comment: Due to the release kinetics of cTnI, a negative result within the first hours of the onset of symptoms does not rule out myocardial infarction with certainty. If myocardial infarction is still suspected, repeat the test at appropriate intervals.   HIV antibody     Status: None   Collection Time: 03/05/17  7:41 AM  Result Value Ref Range   HIV Screen 4th Generation wRfx Non Reactive Non Reactive    Comment: (NOTE) Performed At: [facility] [place], [place] [id number] Lindon Romp MD [id number]   CBC with Differential/Platelet     Status: Abnormal   Collection Time: 03/05/17  8:15 AM  Result Value Ref Range   WBC 8.6 4.0 - 10.5 K/uL   RBC 6.21 (H) 4.22 - 5.81 MIL/uL   Hemoglobin 18.9 (H) 13.0 - 17.0 g/dL   HCT 56.2 (H) 39.0 - 52.0 %   MCV 90.5 78.0 - 100.0 fL   MCH 30.4 26.0 - 34.0 pg   MCHC 33.6 30.0 - 36.0 g/dL   RDW 15.7 (H) 11.5 - 15.5 %   Platelets 202 150 - 400 K/uL   Neutrophils Relative % 65 %   Neutro Abs 5.6 1.7 - 7.7 K/uL   Lymphocytes Relative 26 %   Lymphs Abs 2.2 0.7 - 4.0 K/uL   Monocytes Relative 8 %   Monocytes Absolute 0.7 0.1 - 1.0 K/uL   Eosinophils Relative 2 %   Eosinophils Absolute 0.1 0.0 - 0.7 K/uL   Basophils Relative 0 %   Basophils Absolute 0.0 0.0 - 0.1 K/uL  Protime-INR     Status: None   Collection Time: 03/05/17  8:15 AM  Result Value Ref Range   Prothrombin Time 13.0 11.4 - 15.2 seconds   INR 0.99   APTT     Status: Abnormal   Collection Time: 03/05/17  8:15 AM  Result Value Ref Range   aPTT <20 (L) 24 - 36 seconds    Comment: SPECIMEN COLLECTED IN ANTICOAGULANT ADJUSTED TUBE DUE TO ELEVATED HEMATOCRIT  Culture, blood (Routine X 2) w Reflex to ID Panel     Status: None (Preliminary result)   Collection Time: 03/05/17 11:15 AM  Result Value Ref Range   Specimen Description BLOOD LEFT ARM     Special Requests      BOTTLES DRAWN AEROBIC AND ANAEROBIC Blood Culture results may not be optimal due to an inadequate volume of blood received in culture bottles   Culture NO GROWTH < 24 HOURS    Report Status PENDING   Culture, blood (Routine X 2) w Reflex to ID Panel     Status: None (Preliminary result)   Collection Time: 03/05/17 11:20 AM  Result Value Ref Range   Specimen Description BLOOD RIGHT HAND    Special Requests      BOTTLES DRAWN AEROBIC AND ANAEROBIC Blood Culture results may not be optimal due to an inadequate volume of blood received in culture bottles   Culture NO GROWTH < 24 HOURS    Report Status PENDING   Glucose, capillary     Status: Abnormal   Collection Time: 03/05/17 11:27 AM  Result Value Ref Range   Glucose-Capillary 264 (H) 65 - 99 mg/dL  Glucose, capillary     Status: Abnormal   Collection  Time: 03/05/17  4:44 PM  Result Value Ref Range   Glucose-Capillary 274 (H) 65 - 99 mg/dL  Glucose, capillary     Status: Abnormal   Collection Time: 03/05/17  7:56 PM  Result Value Ref Range   Glucose-Capillary 273 (H) 65 - 99 mg/dL  Glucose, capillary     Status: Abnormal   Collection Time: 03/05/17 10:49 PM  Result Value Ref Range   Glucose-Capillary 266 (H) 65 - 99 mg/dL   Comment 1 Notify RN    Comment 2 Document in Chart   Glucose, capillary     Status: Abnormal   Collection Time: 03/06/17  6:06 AM  Result Value Ref Range   Glucose-Capillary 268 (H) 65 - 99 mg/dL   Comment 1 Notify RN    Comment 2 Document in Chart     Mr Brain W And Wo Contrast  Result Date: 03/05/2017 CLINICAL DATA:  Slurred speech. Acute presentation with right-sided weakness. Slurred speech. Abnormal behavior. History of diabetes. EXAM: MRI HEAD WITHOUT AND WITH CONTRAST TECHNIQUE: Multiplanar, multiecho pulse sequences of the brain and surrounding structures were obtained without and with intravenous contrast. CONTRAST:  5m MULTIHANCE GADOBENATE DIMEGLUMINE 529 MG/ML IV SOLN  COMPARISON:  CT earlier same day FINDINGS: Brain: Background pattern of chronic small vessel ischemic change throughout the pons in the cerebral hemispheric white matter. Two ring-enhancing lesions in the left basal ganglia, adjacent and probably in communication. Together these measure about 4.5 cm in length with a transverse diameter of each component about 1.8 x 2 cm. Central nonenhancing material showing pronounced restricted diffusion. Surrounding vasogenic edema. Findings are most consistent with deep brain abscesses. No other abnormal enhancement. Mild mass effect with 1 or 2 mm of left-to-right shift. No hydrocephalus. No extra-axial collection. Vascular: Major vessels at the base of the brain show flow. Skull and upper cervical spine: Negative Sinuses/Orbits: Clear sinuses except for insignificant retention cyst. Orbits negative. Other: None IMPRESSION: Two adjacent in probably communicating lesions in the left basal ganglia most consistent with brain abscess. In total, these measure approximately 4.5 x 1.8 x 2.0 cm. Electronically Signed   By: MNelson ChimesM.D.   On: 03/05/2017 07:50   Ct Head Code Stroke Wo Contrast  Result Date: 03/05/2017 CLINICAL DATA:  Code stroke.  RIGHT-sided weakness. EXAM: CT HEAD WITHOUT CONTRAST TECHNIQUE: Contiguous axial images were obtained from the base of the skull through the vertex without intravenous contrast. COMPARISON:  None. FINDINGS: BRAIN: 2 cystic masses LEFT basal ganglia measuring 1.5 x 2.3 cm and 1.4 x 2.4 cm with surrounding low-density vasogenic edema. Confluent supratentorial white matter hypodensities. No acute large vascular territory infarct. No hydrocephalus. No midline shift. No abnormal extra-axial fluid collections. VASCULAR: Unremarkable. SKULL/SOFT TISSUES: No skull fracture. No significant soft tissue swelling. ORBITS/SINUSES: The included ocular globes and orbital contents are nonacute. Proptosis. Prominent bilateral superior ophthalmic  veins. The mastoid aircells and included paranasal sinuses are well-aerated. OTHER: None. IMPRESSION: 1. Two cystic masses LEFT basal ganglia with surrounding vasogenic edema. Differential diagnosis includes infection/abscess, metastasis, less likely subacute hematomas. Recommend MRI of the brain with contrast. 2. Severe white matter changes, as seen with HIV/PML, chronic small vessel ischemic disease, prior chemo radiation. 3. Critical Value/emergent results were called by telephone at the time of interpretation on 03/05/2017 at 6:30 am to Dr. IRolland Porter, who verbally acknowledged these results. Electronically Signed   By: CElon AlasM.D.   On: 03/05/2017 06:34    ROS: Unobtainable Blood pressure (!) 181/104,  pulse 95, temperature 99.1 F (37.3 C), temperature source Axillary, resp. rate (!) 32, height _0  (1.676 m), weight 108 kg (238 lb 1.6 oz), SpO2 97 %. Physical Exam  General: A somnolent, but arousable, 61 year old obese black male in no apparent distress  HEENT: The patient's pupils are equal. Extraocular muscles are grossly intact  Neck: Supple  Extremities: Unremarkable  Abdomen: Soft  Neurologic exam: The patient is somnolent but arousable to voice. Glasgow Coma Scale 13, [id number]. He moves all 4 extremities. He follows commands bilaterally. His pupils are equal. Cranial nerve exam is limited.  I have reviewed the patient's brain MRI performed at [facility] yesterday. It demonstrates enhancing cystic left basal ganglia lesions with mild mass effect/shift  Assessment/Plan: Brain lesions: Given the patient's history and MRI appearance, these are likely brain abscesses. Although tumor is another possibility. While it is possible to stereotacticly aspirate these lesions, it would not be without significant risk of bleeding. I therefore would recommend empiric treatment with antibiotics and an infectious disease consult. We will plan to follow his clinical status and  repeat his head CT in a few days, or sooner if clinically indicated.  Margherita Collyer D 03/06/2017, 9:46 AM

## 2017-03-06 NOTE — Progress Notes (Signed)
This RN assessed the Pt. and he is confused and very drowsy. It would be unsafe for the Pt. To eat. I have educated the family about the risk of eating. Family still trying to feed the Pt. After being told the risk.

## 2017-03-06 NOTE — Progress Notes (Addendum)
  Could not complete 2D Echocardiogram since patient would not lie still even with sitter help.  Limited exam was done.  Delcie RochENNINGTON, Tavish Gettis 03/06/2017, 11:13 AM

## 2017-03-06 NOTE — Consult Note (Signed)
[facility]       Reason for Consult: brain abscess    Referring Physician: Dr. Isidoro Donningai  Principal Problem:   Brain abscess Active Problems:   Acute metabolic encephalopathy   Essential hypertension   Diabetes mellitus (HCC)   Elevated troponin   . aspirin EC  81 mg Oral Daily  . enoxaparin (LOVENOX) injection  40 mg Subcutaneous Q24H  . insulin aspart  0-9 Units Subcutaneous Q4H  . insulin glargine  10 Units Subcutaneous Daily  . metoprolol tartrate  5 mg Intravenous Q8H    Recommendations: Continue with ceftriaxone and metronidazole I will d/c vancomycin Repeat CT as per Dr. Lovell SheehanJenkins in 2-3 days   Assessment: He has findings on MRI most c/w brain abscess.  No previous known history of neurosurgery, not MRSA colonized so vancomycin not indicated.   HIV negative  Antibiotics: Vancomycin, ceftriaxone, metronidazole  HPI: Timothy Jordan is a 61 y.o. male with DM, HTN who presented to [facility] with AMS.  Had 48 hours progressively worsenign confusion, poor balance, hallucinations and CT, then MRI most c/w brain abscess. Did have 2 teeth extracted about 1 month ago.  No known drug abuse history, not c/w emboli.  No history from patient.  Tmax 100.5.  No report of trauma.     Review of Systems:  Unable to be assessed due to mental status All other systems reviewed and are negative    Past Medical History:  Diagnosis Date  . Diabetes mellitus without complication [facility](HCC)     Social History  Substance Use Topics  . Smoking status: Former Games developermoker  . Smokeless tobacco: Never Used  . Alcohol use No    No family history on file.  No Known Allergies  Physical Exam: Constitutional: in no apparent distress , not able to answer questions Vitals:   03/06/17 0420 03/06/17 1146  BP:  (!) 174/80  Pulse: 95 (!) 105  Resp: (!) 32 18  Temp:  98.4 F (36.9 C)  SpO2: 97% 92%   EYES: anicteric ENMT: no thrush Cardiovascular: Cor RRR Respiratory: CTA  B; normal respiratory effort GI: Bowel sounds are normal, liver is not enlarged, spleen is not enlarged Musculoskeletal: no pedal edema noted Skin: negatives: no rash Hematologic: no cervical lad  Lab Results  Component Value Date   WBC 8.6 03/05/2017   HGB 18.9 (H) 03/05/2017   HCT 56.2 (H) 03/05/2017   MCV 90.5 03/05/2017   PLT 202 03/05/2017    Lab Results  Component Value Date   CREATININE 1.23 03/05/2017   BUN 18 03/05/2017   NA 139 03/05/2017   K 4.5 03/05/2017   CL 108 03/05/2017   CO2 23 03/05/2017    Lab Results  Component Value Date   ALT 38 03/05/2017   AST 30 03/05/2017   ALKPHOS 154 (H) 03/05/2017     Microbiology: Recent Results (from the past 240 hour(s))  Culture, blood (Routine X 2) w Reflex to ID Panel     Status: None (Preliminary result)   Collection Time: 03/05/17 11:15 AM  Result Value Ref Range Status   Specimen Description BLOOD LEFT ARM  Final   Special Requests   Final    BOTTLES DRAWN AEROBIC AND ANAEROBIC Blood Culture results may not be optimal due to an inadequate volume of blood received in culture bottles   Culture NO GROWTH < 24 HOURS  Final   Report Status PENDING  Incomplete  Culture, blood (Routine X 2) w  Reflex to ID Panel     Status: None (Preliminary result)   Collection Time: 03/05/17 11:20 AM  Result Value Ref Range Status   Specimen Description BLOOD RIGHT HAND  Final   Special Requests   Final    BOTTLES DRAWN AEROBIC AND ANAEROBIC Blood Culture results may not be optimal due to an inadequate volume of blood received in culture bottles   Culture NO GROWTH < 24 HOURS  Final   Report Status PENDING  Incomplete    Staci Righter, MD [facility] [email or web address] [phone] pager  [phone] cell 03/06/2017, 12:16 PM

## 2017-03-06 NOTE — Progress Notes (Signed)
Patient continue to be confused, unable to answer any orientation questions. RN has advised family to please not feed the patient, because of the risk of aspiration due to patient being confused.

## 2017-03-07 ENCOUNTER — Inpatient Hospital Stay (HOSPITAL_COMMUNITY)

## 2017-03-07 DIAGNOSIS — G060 Intracranial abscess and granuloma: Secondary | ICD-10-CM | POA: Diagnosis present

## 2017-03-07 DIAGNOSIS — J9600 Acute respiratory failure, unspecified whether with hypoxia or hypercapnia: Secondary | ICD-10-CM

## 2017-03-07 LAB — GLUCOSE, CAPILLARY
GLUCOSE-CAPILLARY: 172 mg/dL — AB (ref 65–99)
GLUCOSE-CAPILLARY: 178 mg/dL — AB (ref 65–99)
GLUCOSE-CAPILLARY: 191 mg/dL — AB (ref 65–99)
Glucose-Capillary: 181 mg/dL — ABNORMAL HIGH (ref 65–99)
Glucose-Capillary: 237 mg/dL — ABNORMAL HIGH (ref 65–99)

## 2017-03-07 LAB — POCT I-STAT 3, ART BLOOD GAS (G3+)
Acid-base deficit: 6 mmol/L — ABNORMAL HIGH (ref 0.0–2.0)
Bicarbonate: 20 mmol/L (ref 20.0–28.0)
O2 SAT: 98 %
TCO2: 21 mmol/L — AB (ref 22–32)
pCO2 arterial: 41.6 mmHg (ref 32.0–48.0)
pH, Arterial: 7.295 — ABNORMAL LOW (ref 7.350–7.450)
pO2, Arterial: 128 mmHg — ABNORMAL HIGH (ref 83.0–108.0)

## 2017-03-07 LAB — BASIC METABOLIC PANEL
ANION GAP: 13 (ref 5–15)
BUN: 14 mg/dL (ref 6–20)
CALCIUM: 8.5 mg/dL — AB (ref 8.9–10.3)
CHLORIDE: 110 mmol/L (ref 101–111)
CO2: 18 mmol/L — AB (ref 22–32)
CREATININE: 1.33 mg/dL — AB (ref 0.61–1.24)
GFR calc non Af Amer: 57 mL/min — ABNORMAL LOW (ref >60)
Glucose, Bld: 144 mg/dL — ABNORMAL HIGH (ref 65–99)
Potassium: 4.5 mmol/L (ref 3.5–5.1)
SODIUM: 141 mmol/L (ref 135–145)

## 2017-03-07 LAB — CBC
HCT: 56.8 % — ABNORMAL HIGH (ref 39.0–52.0)
Hemoglobin: 19.1 g/dL — ABNORMAL HIGH (ref 13.0–17.0)
MCH: 30.5 pg (ref 26.0–34.0)
MCHC: 33.6 g/dL (ref 30.0–36.0)
MCV: 90.7 fL (ref 78.0–100.0)
Platelets: 236 10*3/uL (ref 150–400)
RBC: 6.26 MIL/uL — AB (ref 4.22–5.81)
RDW: 16.3 % — ABNORMAL HIGH (ref 11.5–15.5)
WBC: 13 10*3/uL — AB (ref 4.0–10.5)

## 2017-03-07 LAB — MRSA PCR SCREENING: MRSA BY PCR: NEGATIVE

## 2017-03-07 LAB — TRIGLYCERIDES: TRIGLYCERIDES: 103 mg/dL (ref <150)

## 2017-03-07 LAB — PROCALCITONIN: PROCALCITONIN: 1.02 ng/mL

## 2017-03-07 MED ORDER — KCL IN DEXTROSE-NACL 20-5-0.45 MEQ/L-%-% IV SOLN
INTRAVENOUS | Status: DC
  Administered 2017-03-07 (×2): via INTRAVENOUS
  Filled 2017-03-07 (×3): qty 1000

## 2017-03-07 MED ORDER — ETOMIDATE 2 MG/ML IV SOLN
0.3000 mg/kg | Freq: Once | INTRAVENOUS | Status: DC
  Filled 2017-03-07: qty 16.2

## 2017-03-07 MED ORDER — IBUPROFEN 100 MG/5ML PO SUSP
400.0000 mg | Freq: Once | ORAL | Status: AC
  Administered 2017-03-07: 400 mg
  Filled 2017-03-07: qty 20

## 2017-03-07 MED ORDER — SODIUM CHLORIDE 0.9 % IV SOLN
250.0000 mL | INTRAVENOUS | Status: DC | PRN
  Administered 2017-03-07 – 2017-03-08 (×3): via INTRAVENOUS

## 2017-03-07 MED ORDER — PANTOPRAZOLE SODIUM 40 MG IV SOLR
40.0000 mg | Freq: Every day | INTRAVENOUS | Status: DC
  Administered 2017-03-07 – 2017-03-10 (×4): 40 mg via INTRAVENOUS
  Filled 2017-03-07 (×4): qty 40

## 2017-03-07 MED ORDER — INSULIN ASPART 100 UNIT/ML ~~LOC~~ SOLN
0.0000 [IU] | SUBCUTANEOUS | Status: DC
  Administered 2017-03-07: 7 [IU] via SUBCUTANEOUS
  Administered 2017-03-07: 11 [IU] via SUBCUTANEOUS
  Administered 2017-03-08: 7 [IU] via SUBCUTANEOUS
  Administered 2017-03-08: 4 [IU] via SUBCUTANEOUS
  Administered 2017-03-08: 7 [IU] via SUBCUTANEOUS
  Administered 2017-03-08 (×2): 4 [IU] via SUBCUTANEOUS
  Administered 2017-03-08: 7 [IU] via SUBCUTANEOUS
  Administered 2017-03-09 (×2): 4 [IU] via SUBCUTANEOUS
  Administered 2017-03-09: 7 [IU] via SUBCUTANEOUS
  Administered 2017-03-09 (×2): 4 [IU] via SUBCUTANEOUS
  Administered 2017-03-09: 7 [IU] via SUBCUTANEOUS
  Administered 2017-03-10: 11 [IU] via SUBCUTANEOUS
  Administered 2017-03-10 (×4): 7 [IU] via SUBCUTANEOUS
  Administered 2017-03-10: 11 [IU] via SUBCUTANEOUS
  Administered 2017-03-10 – 2017-03-11 (×2): 7 [IU] via SUBCUTANEOUS
  Administered 2017-03-11: 11 [IU] via SUBCUTANEOUS
  Administered 2017-03-11: 4 [IU] via SUBCUTANEOUS
  Administered 2017-03-11: 7 [IU] via SUBCUTANEOUS

## 2017-03-07 MED ORDER — CHLORHEXIDINE GLUCONATE 0.12% ORAL RINSE (MEDLINE KIT)
15.0000 mL | Freq: Two times a day (BID) | OROMUCOSAL | Status: DC
  Administered 2017-03-07 – 2017-03-24 (×35): 15 mL via OROMUCOSAL

## 2017-03-07 MED ORDER — FENTANYL CITRATE (PF) 100 MCG/2ML IJ SOLN: 50.0000 ug | Freq: Once | INTRAMUSCULAR | Status: DC

## 2017-03-07 MED ORDER — ORAL CARE MOUTH RINSE
15.0000 mL | OROMUCOSAL | Status: DC
  Administered 2017-03-07 – 2017-03-24 (×171): 15 mL via OROMUCOSAL

## 2017-03-07 MED ORDER — PROPOFOL 1000 MG/100ML IV EMUL
5.0000 ug/kg/min | INTRAVENOUS | Status: DC
  Administered 2017-03-07: 5 ug/kg/min via INTRAVENOUS
  Administered 2017-03-07: 15 ug/kg/min via INTRAVENOUS
  Filled 2017-03-07 (×2): qty 100

## 2017-03-07 MED ORDER — FENTANYL CITRATE (PF) 100 MCG/2ML IJ SOLN
INTRAMUSCULAR | Status: AC
  Filled 2017-03-07: qty 2

## 2017-03-07 NOTE — Progress Notes (Signed)
RN was called to the room  Because pt was having trouble breathing. Vitals were temp 98.5, BP 208/103, RR 39, 93 % RA. MD currently at bedside. Res[iraotry stat pg for ABG.

## 2017-03-07 NOTE — Procedures (Signed)
History: 61 yo M with brain abcess  Sedation: propofol  Technique: This is a 21 channel routine scalp EEG performed at the bedside with bipolar and monopolar montages arranged in accordance to the international 10/20 system of electrode placement. One channel was dedicated to EKG recording.    Background: The background consists of generalized irregular slow activity with occasional superimposed sleep spindles. No PDR was seen.    Photic stimulation: Physiologic driving is not performed  EEG Abnormalities: 1) Absence of waking state.   Clinical Interpretation: This EEG is consistent with the patient's sedated state without other clear abnormality. The lack of waking EEG precludes evaluation for encephalopathy. There was no seizure or seizure predisposition recorded on this study. Please note that a normal EEG does not preclude the possibility of epilepsy.   Ritta SlotMcNeill Heath Badon, MD [facility] [phone]  If 7pm- 7am, please page neurology on call as listed in AMION.

## 2017-03-07 NOTE — Progress Notes (Signed)
eLink Physician-Brief Progress Note Patient Name: Timothy Jordan DOB: Oct 14, 1955 MRN: [id number]   Date of Service  03/07/2017  HPI/Events of Note  Fever to 102.2 F - Already on Abx Rx and Tylenol. Creatinine = 1.33  eICU Interventions  Will order: 1. Motrin suspension 400 mg per tube X 1 now.  2. Cooling blanket PRN if Motrin and Tylenol not effective in controlling fever.      Intervention Category Major Interventions: Other:  Lenell AntuSommer,Steven Eugene 03/07/2017, 9:39 PM

## 2017-03-07 NOTE — Progress Notes (Signed)
Received page from nurse on [phone]. Patient not doing well and requests chaplain for the family. Family had gone up to [facility] [address] waiting for patient to be moved there.  Went to waiting area of [facility] and found the family-Had prayer with the family and they saw patient being moved out for testing.  Went back up to [facility] [address] and family decided it would be good to go to cafeteria and return. I shared they could ask nurse to page chaplain on call if they want me to come back.  Phebe CollaDonna S Vitaliy Eisenhour, Chaplain    03/07/17 1000  Clinical Encounter Type  Visited With Family  Visit Type Initial;Spiritual support  Referral From Nurse  Spiritual Encounters  Spiritual Needs Prayer  Stress Factors  Patient Stress Factors None identified  Family Stress Factors None identified

## 2017-03-07 NOTE — Progress Notes (Signed)
[facility]                                                                              Patient Demographics  Timothy Jordan, is a 61 y.o. male, DOB - 07-Jul-1955, WUJ:[id number]  Admit date - 03/05/2017   Admitting Physician Mauricio Annett Gula, MD  Outpatient Primary MD for the patient is Patient, No Pcp Per  Outpatient specialists:   LOS - 2  days   Medical records reviewed and are as summarized below:    Chief Complaint  Patient presents with  . Code Stroke       Brief summary   Patient is a 61 year old male with diabetes type 2, hypertension, hyperlipidemia presented with altered mental status.  Patient had a teeth extraction x2 in the last month due to poor hygiene and periodontal infection, received prophylactic antibiotics.  In the last 2 days had developed progressive confusion, to the point where he was having falls, hallucinations and imbalance.  MRI brain showed 2 adjacent and probably communicating lesions in the left basal ganglia most consistent with brain abscesses.   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  Per Dr. Lovell Sheehan on 10/27, recommended empiric antibiotics, repeat imaging.  ID following, recommended continue ceftriaxone, metronidazole, discontinue vancomycin -This morning on examination, patient having apneic breathing, mental status changes with not following any commands.  Neurology consulted, recommended CT head stat, EEG to rule out seizures -Critical care consulted, appreciate Dr. Roselyn Bering for evaluating the patient, likely will need intubation for airway protection. ABG, chest x-ray ordered stat.  - Also discussed with Dr. Lovell Sheehan, recommended CT head with brain  lab protocol  -will transfer to ICU, updated family, patient's wife on the phone  Active Problems:   Acute metabolic encephalopathy -Please see #1     Essential hypertension -BP elevated 208/103 on examination, gave hydralazine 10 mg IV x1  - currently altered mental status, placed on IV Lopressor scheduled, IV hydralazine as needed    Diabetes mellitus (HCC) -Currently altered mental status, keep sliding scale insulin every 4 hours, Lantus 10 units daily -Hemoglobin A1c 9.9    Elevated troponin -2D echo with EF of 55-60%, systolic function normal  Obesity -BMI 38.4   Code Status: Full CODE STATUS DVT Prophylaxis:  Lovenox Family Communication: Called patient's wife, updated current events, imagings and recommendations from specialists.  Disposition Plan:  Time Spent in minutes   45 minutes  Procedures:  MRI brain 2D echo: Left ventricle: The cavity size was at the upper limits of normal. Wall thickness was increased in a pattern of mild LVH.  Systolic function was normal. The estimated ejection fraction was in the range of 55% to 60%.  Consultants:   Neurosurgery ID  Antimicrobials:   IV vancomycin 10/26> 10/27  IV Flagyl 10/26>  IV Rocephin 10/26>   Medications  Scheduled Meds: . aspirin EC  81 mg Oral Daily  . enoxaparin (LOVENOX) injection  40 mg Subcutaneous Q24H  . insulin aspart  0-9 Units Subcutaneous Q4H  . insulin glargine  10 Units Subcutaneous Daily  . metoprolol tartrate  5 mg Intravenous Q8H   Continuous Infusions: . sodium chloride 75 mL/hr at 03/07/17 0112  . cefTRIAXone (ROCEPHIN)  IV Stopped (03/07/17 0202)  . metronidazole Stopped (03/07/17 0304)   PRN Meds:.acetaminophen **OR** acetaminophen, hydrALAZINE, LORazepam, ondansetron **OR** ondansetron (ZOFRAN) IV   Antibiotics   Anti-infectives    Start     Dose/Rate Route Frequency Ordered Stop   03/06/17 1400  cefTRIAXone (ROCEPHIN) 2 g in dextrose 5 % 50 mL IVPB     2 g 100 mL/hr  over 30 Minutes Intravenous Every 12 hours 03/06/17 1210     03/05/17 2300  vancomycin (VANCOCIN) IVPB 750 mg/150 ml premix  Status:  Discontinued     750 mg 150 mL/hr over 60 Minutes Intravenous Every 12 hours 03/05/17 1049 03/06/17 1216   03/05/17 1100  vancomycin (VANCOCIN) 2,000 mg in sodium chloride 0.9 % 500 mL IVPB     2,000 mg 250 mL/hr over 120 Minutes Intravenous  Once 03/05/17 1049 03/05/17 1721   03/05/17 1045  cefTRIAXone (ROCEPHIN) 2 g in dextrose 5 % 50 mL IVPB  Status:  Discontinued     2 g 100 mL/hr over 30 Minutes Intravenous Every 12 hours 03/05/17 1038 03/06/17 1210   03/05/17 1045  metroNIDAZOLE (FLAGYL) IVPB 500 mg     500 mg 100 mL/hr over 60 Minutes Intravenous Every 8 hours 03/05/17 1038          Subjective:   Christhoper Abe was seen and examined today.  Acute mental status changes with not following commands, apneic breathing.  Objective:   Vitals:   03/07/17 0816 03/07/17 0826 03/07/17 0836 03/07/17 0841  BP: (!) 208/103  (!) 169/115 (!) (P) 176/85  Pulse:      Resp: (!) 39     Temp:  98.5 F (36.9 C)    TempSrc:  Axillary    SpO2:    (P) 94%  Weight:      Height:        Intake/Output Summary (Last 24 hours) at 03/07/17 0901 Last data filed at 03/06/17 2026  Gross per 24 hour  Intake                0 ml  Output              620 ml  Net             -620 ml     Wt Readings from Last 3 Encounters:  03/05/17 108 kg (238 lb 1.6 oz)  02/19/14 104.8 kg (231 lb)  02/16/14 104.8 kg (231 lb)     Exam  General: Apneic breathing, not following any commands  Eyes: PERRLA, EOMI, Anicteric Sclera,  HEENT:    Cardiovascular: S1 S2 clear, RRR, trace pedal edema b/l  Respiratory: Decreased breath sound at the bases, no wheezing  Gastrointestinal: Obese, soft, nondistended, + bowel sounds  Ext: no pedal edema bilaterally  Neuro: not following any commands  Musculoskeletal: No digital cyanosis, clubbing  Skin: No rashes  Psych: apneic  breathing, no following any commands   Data Reviewed:  I have personally reviewed following labs and imaging studies  Micro Results Recent Results (from the past 240 hour(s))  Culture, blood (Routine X 2) w Reflex to ID Panel     Status:  None (Preliminary result)   Collection Time: 03/05/17 11:15 AM  Result Value Ref Range Status   Specimen Description BLOOD LEFT ARM  Final   Special Requests   Final    BOTTLES DRAWN AEROBIC AND ANAEROBIC Blood Culture results may not be optimal due to an inadequate volume of blood received in culture bottles   Culture NO GROWTH 2 DAYS  Final   Report Status PENDING  Incomplete  Culture, blood (Routine X 2) w Reflex to ID Panel     Status: None (Preliminary result)   Collection Time: 03/05/17 11:20 AM  Result Value Ref Range Status   Specimen Description BLOOD RIGHT HAND  Final   Special Requests   Final    BOTTLES DRAWN AEROBIC AND ANAEROBIC Blood Culture results may not be optimal due to an inadequate volume of blood received in culture bottles   Culture NO GROWTH 2 DAYS  Final   Report Status PENDING  Incomplete    Radiology Reports Mr Laqueta Jean And Wo Contrast  Result Date: 03/05/2017 CLINICAL DATA:  Slurred speech. Acute presentation with right-sided weakness. Slurred speech. Abnormal behavior. History of diabetes. EXAM: MRI HEAD WITHOUT AND WITH CONTRAST TECHNIQUE: Multiplanar, multiecho pulse sequences of the brain and surrounding structures were obtained without and with intravenous contrast. CONTRAST:  20mL MULTIHANCE GADOBENATE DIMEGLUMINE 529 MG/ML IV SOLN COMPARISON:  CT earlier same day FINDINGS: Brain: Background pattern of chronic small vessel ischemic change throughout the pons in the cerebral hemispheric white matter. Two ring-enhancing lesions in the left basal ganglia, adjacent and probably in communication. Together these measure about 4.5 cm in length with a transverse diameter of each component about 1.8 x 2 cm. Central nonenhancing  material showing pronounced restricted diffusion. Surrounding vasogenic edema. Findings are most consistent with deep brain abscesses. No other abnormal enhancement. Mild mass effect with 1 or 2 mm of left-to-right shift. No hydrocephalus. No extra-axial collection. Vascular: Major vessels at the base of the brain show flow. Skull and upper cervical spine: Negative Sinuses/Orbits: Clear sinuses except for insignificant retention cyst. Orbits negative. Other: None IMPRESSION: Two adjacent in probably communicating lesions in the left basal ganglia most consistent with brain abscess. In total, these measure approximately 4.5 x 1.8 x 2.0 cm. Electronically Signed   By: Paulina Fusi M.D.   On: 03/05/2017 07:50   Ct Head Code Stroke Wo Contrast  Result Date: 03/05/2017 CLINICAL DATA:  Code stroke.  RIGHT-sided weakness. EXAM: CT HEAD WITHOUT CONTRAST TECHNIQUE: Contiguous axial images were obtained from the base of the skull through the vertex without intravenous contrast. COMPARISON:  None. FINDINGS: BRAIN: 2 cystic masses LEFT basal ganglia measuring 1.5 x 2.3 cm and 1.4 x 2.4 cm with surrounding low-density vasogenic edema. Confluent supratentorial white matter hypodensities. No acute large vascular territory infarct. No hydrocephalus. No midline shift. No abnormal extra-axial fluid collections. VASCULAR: Unremarkable. SKULL/SOFT TISSUES: No skull fracture. No significant soft tissue swelling. ORBITS/SINUSES: The included ocular globes and orbital contents are nonacute. Proptosis. Prominent bilateral superior ophthalmic veins. The mastoid aircells and included paranasal sinuses are well-aerated. OTHER: None. IMPRESSION: 1. Two cystic masses LEFT basal ganglia with surrounding vasogenic edema. Differential diagnosis includes infection/abscess, metastasis, less likely subacute hematomas. Recommend MRI of the brain with contrast. 2. Severe white matter changes, as seen with HIV/PML, chronic small vessel ischemic  disease, prior chemo radiation. 3. Critical Value/emergent results were called by telephone at the time of interpretation on 03/05/2017 at 6:30 am to Dr. Devoria Albe , who verbally acknowledged  these results. Electronically Signed   By: Awilda Metroourtnay  Bloomer M.D.   On: 03/05/2017 06:34    Lab Data:  CBC:  Recent Labs Lab 03/05/17 0815 03/07/17 0453  WBC 8.6 13.0*  NEUTROABS 5.6  --   HGB 18.9* 19.1*  HCT 56.2* 56.8*  MCV 90.5 90.7  PLT 202 236   Basic Metabolic Panel:  Recent Labs Lab 03/05/17 0620 03/07/17 0643  NA 139 141  K 4.5 4.5  CL 108 110  CO2 23 18*  GLUCOSE 305* 144*  BUN 18 14  CREATININE 1.23 1.33*  CALCIUM 8.7* 8.5*   GFR: Estimated Creatinine Clearance: 68.9 mL/min (A) (by C-G formula based on SCr of 1.33 mg/dL (H)). Liver Function Tests:  Recent Labs Lab 03/05/17 0620  AST 30  ALT 38  ALKPHOS 154*  BILITOT 1.0  PROT 7.2  ALBUMIN 3.1*   No results for input(s): LIPASE, AMYLASE in the last 168 hours. No results for input(s): AMMONIA in the last 168 hours. Coagulation Profile:  Recent Labs Lab 03/05/17 0815  INR 0.99   Cardiac Enzymes:  Recent Labs Lab 03/06/17 1203 03/06/17 1645 03/06/17 2230  TROPONINI 0.46* 0.50* 0.51*   BNP (last 3 results) No results for input(s): PROBNP in the last 8760 hours. HbA1C:  Recent Labs  03/06/17 1203  HGBA1C 9.9*   CBG:  Recent Labs Lab 03/06/17 1643 03/06/17 2031 03/06/17 2351 03/07/17 0359 03/07/17 0815  GLUCAP 223* 223* 178* 191* 172*   Lipid Profile: No results for input(s): CHOL, HDL, LDLCALC, TRIG, CHOLHDL, LDLDIRECT in the last 72 hours. Thyroid Function Tests: No results for input(s): TSH, T4TOTAL, FREET4, T3FREE, THYROIDAB in the last 72 hours. Anemia Panel: No results for input(s): VITAMINB12, FOLATE, FERRITIN, TIBC, IRON, RETICCTPCT in the last 72 hours. Urine analysis:    Component Value Date/Time   COLORURINE YELLOW 03/05/2017 0622   APPEARANCEUR CLEAR 03/05/2017 0622    LABSPEC 1.029 03/05/2017 0622   PHURINE 5.0 03/05/2017 0622   GLUCOSEU >=500 (A) 03/05/2017 0622   HGBUR SMALL (A) 03/05/2017 0622   BILIRUBINUR NEGATIVE 03/05/2017 0622   KETONESUR NEGATIVE 03/05/2017 0622   PROTEINUR >=300 (A) 03/05/2017 0622   NITRITE NEGATIVE 03/05/2017 0622   LEUKOCYTESUR NEGATIVE 03/05/2017 0622     Keyli Duross M.D. [facility] 03/07/2017, 9:01 AM  Pager: [phone] Between 7am to 7pm - call Pager - [phone]  After 7pm go to [email or web address] - password [email or web address]  Call night coverage person covering after 7pm

## 2017-03-07 NOTE — Consult Note (Signed)
PULMONARY / CRITICAL CARE MEDICINE   Name: Timothy Jordan MRN: [id number] DOB: 1956/05/09    ADMISSION DATE:  03/05/2017 CONSULTATION DATE:  03/07/2017  REFERRING MD:  Rod Can  CHIEF COMPLAINT:  Hypopnea with impending respiratory failure  HISTORY OF PRESENT ILLNESS:   This is a 61 y.o. B M admitted 03/05/2017 after presenting with altered MS. The patient had apparently been developing progressive confusion and disorientation with ataxia and hallucinations, along with dysarthria and RUE weakness. There were no associated fevers or rigors. Upon presenting to the ED at [facility] a head CT was performed indicating two cystic masses LEFT basal ganglia with surrounding vasogenic edema, suggestive of a brain abscess, which was confirmed by MRI. He was then Tx to [facility] for further management.  Neurosurgery recommended conservative management with Abx, considering the risks of stereotactic aspiration. Current ABxs are Rocephin + Flagyl per ID.  Of note is that the patient had some teeth extraction ~ 1 month ago and did receive prophylactic Abxs. Over the past 12 hours his mental status has declined with reported apneic episodes, for which PCCM is now being consulted.  PAST MEDICAL HISTORY :  He  has a past medical history of Diabetes mellitus without complication (HCC).  PAST SURGICAL HISTORY: He  has a past surgical history that includes Hernia repair and Achilles tendon repair.  No Known Allergies  No current facility-administered medications on file prior to encounter.    Current Outpatient Prescriptions on File Prior to Encounter  Medication Sig  . aspirin EC 81 MG tablet Take 81 mg by mouth daily.  . insulin glargine (LANTUS) 100 UNIT/ML injection Inject 70 Units into the skin every morning.    FAMILY HISTORY:  His has no family status information on file.    SOCIAL HISTORY: He  reports that he has quit smoking. He has never used smokeless tobacco. He reports that he does not  drink alcohol.  REVIEW OF SYSTEMS:   Per the HPI as the patient is unresponsive to verbal stimuli.  SUBJECTIVE:  Obviously hypoventilating with secretions gurgling in the airway.  VITAL SIGNS: BP (!) (P) 176/85   Pulse 96   Temp 98.5 F (36.9 C) (Axillary)   Resp (!) 39   Ht 5\' 6"  (1.676 m)   Wt 108 kg (238 lb 1.6 oz)   SpO2 (P) 94%   BMI 38.43 kg/m   HEMODYNAMICS: Stable    VENTILATOR SETTINGS:    INTAKE / OUTPUT: I/O last 3 completed shifts: In: 875 [I.V.:675; IV Piggyback:200] Out: 1620 [Urine:1620]  PHYSICAL EXAMINATION: General:  WD slightly obese B M obviously having difficulty maintaining a patent airway Neuro:  Moves all 4s to noxious stimuli HEENT:  [place]/AT. PRRL; thick secretions in posterior pharynx Cardiovascular:  RRR without murmurs Lungs:  Decreased BS but without crackles, wheezes Abdomen:  Protuberant. No guarding. No discrete organomegaly Musculoskeletal:  Dressing over right shin. No obvious joint deformities Skin:  No edema, clubbing  LABS:  BMET  Recent Labs Lab 03/05/17 0620 03/07/17 0643  NA 139 141  K 4.5 4.5  CL 108 110  CO2 23 18*  BUN 18 14  CREATININE 1.23 1.33*  GLUCOSE 305* 144*    Electrolytes  Recent Labs Lab 03/05/17 0620 03/07/17 0643  CALCIUM 8.7* 8.5*    CBC  Recent Labs Lab 03/05/17 0815 03/07/17 0453  WBC 8.6 13.0*  HGB 18.9* 19.1*  HCT 56.2* 56.8*  PLT 202 236    Coag's  Recent Labs Lab  03/05/17 0815  APTT <20*  INR 0.99    Sepsis Markers No results for input(s): LATICACIDVEN, PROCALCITON, O2SATVEN in the last 168 hours.  ABG No results for input(s): PHART, PCO2ART, PO2ART in the last 168 hours.  Liver Enzymes  Recent Labs Lab 03/05/17 0620  AST 30  ALT 38  ALKPHOS 154*  BILITOT 1.0  ALBUMIN 3.1*    Cardiac Enzymes  Recent Labs Lab 03/06/17 1203 03/06/17 1645 03/06/17 2230  TROPONINI 0.46* 0.50* 0.51*    Glucose  Recent Labs Lab 03/06/17 1136 03/06/17 1643  03/06/17 2031 03/06/17 2351 03/07/17 0359 03/07/17 0815  GLUCAP 239* 223* 223* 178* 191* 172*    Imaging Dg Chest Port 1 View  Result Date: 03/07/2017 CLINICAL DATA:  Dyspnea. EXAM: PORTABLE CHEST 1 VIEW COMPARISON:  None. FINDINGS: The cardiac silhouette is enlarged. Lung volumes are diminished with fine diffuse interstitial density bilaterally. No large pleural effusion or pneumothorax is identified. No acute osseous abnormality is seen. IMPRESSION: Cardiomegaly and low lung volumes with fine interstitial density bilaterally which may reflect viral/atypical infection or edema. Electronically Signed   By: Sebastian AcheAllen  Grady M.D.   On: 03/07/2017 09:10     STUDIES:  PCXR personally reviewed. Poor inspiratory effort so not surprised by increased interstitial markings. No obvious parenchymal process.  CULTURES: Blood cultures with NG after 48 hours  ANTIBIOTICS: Ceftriaxone + metronidazole; vancomycin d/c'd yesterday per ID  SIGNIFICANT EVENTS: Progressive decline in MS  LINES/TUBES: Periph IV; ETT  DISCUSSION: 61 y.o. M with L basal ganglia brain abscess. Per my assessment it was uncertain that the patient is able to maintain a patent airway, and therefore electively intubated. The patient was assessed by Neurology who requested a repeat head CT. Will accept in tx to CCM service for airway management in the ICU.  ASSESSMENT / PLAN:  PULMONARY A: Likely Hypoxemic and hypercapneic ventilatory failure. Had attempted to obtain an ABG prior to intubation, which was unsuccessful. P:   Intubation and mechanical ventilation for airway control.  CARDIOVASCULAR A:  No acute CV problems P:  Monitoring  RENAL A:   Slight increase in creatinine over the past 2 days with decreasing CO2 content. Has had poor intake since admission P:   Will increase IV fluids and follow BMP  GASTROINTESTINAL A:   No prior hx GI disorders P:   As now intubated will initiate GI  prophylaxis  HEMATOLOGIC A:   Actually polycythemic P:  Follow with rehydration  INFECTIOUS A:   WBC increased 10 13.0 today c/w 8.6 yesterday P:   Will reculture. Check procalcitonin  ENDOCRINE A:   Type II DM. On SS insulin since admission   P:   Continue  NEUROLOGIC A:   Worsening MS P:   To Radiol for repeat head CT RASS to -2   FAMILY  - Updates:      [facility]ulmonary and Critical Care Medicine [facility] Pager: [phone]  03/07/2017, 9:25 AM

## 2017-03-07 NOTE — Significant Event (Signed)
Rapid Response Event Note  Overview:  Called by Rn for assistance with intubation Time Called: 0920 Arrival Time: 0925 Event Type: Respiratory, Neurologic  Initial Focused Assessment:  Called by Rn for assistance with intubation, CCM at bedside.  On my arrival CCM MD at bedside, RN and RT.      Interventions:  Per Dr. Roselyn Beringrim, 50mcg IV fentanyl given @ 0933, 30 mg IV etomidate given at 0934, 80mg  succ IV given @ 0944.  Patient intubated by Dr. Roselyn Beringrim [phone].  Given 2 mg IV ativan prior to transport.  Patient placed on vent by Rt.  Patient transported to radiology for CT scan and chest xray.    Plan of Care (if not transferred): Patient transported to 4 N ICU after radiology.  Patient was transported via bed, with monitor and ventilator.    Event Summary:  Report given to RN by primary RN.   at      at          [facility], Maryagnes Amosenise Ann

## 2017-03-07 NOTE — Consult Note (Signed)
NEURO HOSPITALIST CONSULT NOTE   Requestig physician: Dr. Gershon Crane   Reason for Consult: Change in mentation in a patient with left intracranial abscess   History obtained from: Chart  HPI:                                                                                                                                          Timothy Jordan is an 61 y.o. male who initially presented to any pen with altered mental status.  Per notes patient had progressive worsening of confusion, poor balance and hallucinations.  Patient had a CT and then MRI which was most consistent with brain abscess.  Per chart patient did have 2 teeth extracted about a month ago.  Patient's brain abscess is currently being treated with Rocephin and vancomycin.   Per nurse overnight patient was agitated and was given Ativan--she was given Ativan yesterday at 0353, 1328, 1913, this morning at 0304, and 0703.  Neurology was consulted for drastic change in mentation overnight.  Currently patient is having difficulty with his breathing showing short shallow breaths.  Currently PCCM is at bedside getting blood gases and most likely will be intubating patient.  Patient is unable to answer any questions however from a neuro standpoint it is obvious that patient has a right gaze deviation in addition patient is withdrawing from pain in all extremities but he is withdrawing more briskly and strongly on the left than the right.  Currently awaiting possible intubation and then will obtain EEG and stat head CT.  Past Medical History:  Diagnosis Date  . Diabetes mellitus without complication [facility])     Past Surgical History:  Procedure Laterality Date  . ACHILLES TENDON REPAIR    . HERNIA REPAIR        Family History: Mother hypertension Father hypertension  Social History:  reports that he has quit smoking. He has never used smokeless tobacco. He reports that he does not drink alcohol. His drug history is not  on file.  No Known Allergies  MEDICATIONS:                                                                                                                     Scheduled: . fentaNYL      . aspirin  EC  81 mg Oral Daily  . enoxaparin (LOVENOX) injection  40 mg Subcutaneous Q24H  . etomidate  0.3 mg/kg Intravenous Once  . fentaNYL (SUBLIMAZE) injection  50 mcg Intravenous Once  . insulin aspart  0-9 Units Subcutaneous Q4H  . insulin glargine  10 Units Subcutaneous Daily  . metoprolol tartrate  5 mg Intravenous Q8H   Continuous: . sodium chloride 75 mL/hr at 03/07/17 0112  . cefTRIAXone (ROCEPHIN)  IV Stopped (03/07/17 0202)  . metronidazole Stopped (03/07/17 0304)     ROS:                                                                                                                                       History obtained from unobtainable from patient due to mental status     Blood pressure (!) (P) 176/85, pulse 96, temperature 98.5 F (36.9 C), temperature source Axillary, resp. rate (!) 39, height 5\' 6"  (1.676 m), weight 108 kg (238 lb 1.6 oz), SpO2 (P) 94 %.   Neurologic Examination:                                                                                                      HEENT-  Normocephalic, no lesions, without obvious abnormality.  Normal external eye and conjunctiva.  Normal TM's bilaterally.  Normal auditory canals and external ears. Normal external nose, mucus membranes and septum.  Normal pharynx. Cardiovascular- S1, S2 normal, pulses palpable throughout   Lungs-  short, shallow irregular breathing Abdomen- normal findings: bowel sounds normal Extremities- no edema Lymph-no adenopathy palpable Musculoskeletal-no joint tenderness, deformity or swelling Skin-warm and dry, no hyperpigmentation, vitiligo, or suspicious lesions  Neurological Examination Mental Status: Patient is almost obtunded but does awaken to noxious stimuli.  Patient follows no commands  but does withdrawal from discomfort. Cranial Nerves: II: Patient does wince when bright light is shown into his eyes III,IV, VI: ptosis not present, extra-ocular motions intact but does have a right gaze preference, round, reactive to light and accommodation V,VII: Face symmetric, facial light touch sensation normal bilaterally VIII: Difficult to assess but appears to be intact IX,X: Unable to assess XI: Unable to assess XII: Unable to assess Motor: Right : Upper extremity   4/5    Left:     Upper extremity   5/5  Lower extremity   4/5     Lower extremity   5/5 No seizure-like activity noted however as stated upper and lower extremity less than his left.  This however would correlate with the left brain abscess. Sensory: Withdraws from pain briskly Deep Tendon Reflexes: 2+ and symmetric throughout Plantars: Bilaterally Cerebellar: To assess Gait: Not tested      Lab Results: Basic Metabolic Panel:  Recent Labs Lab 03/05/17 0620 03/07/17 0643  NA 139 141  K 4.5 4.5  CL 108 110  CO2 23 18*  GLUCOSE 305* 144*  BUN 18 14  CREATININE 1.23 1.33*  CALCIUM 8.7* 8.5*    Liver Function Tests:  Recent Labs Lab 03/05/17 0620  AST 30  ALT 38  ALKPHOS 154*  BILITOT 1.0  PROT 7.2  ALBUMIN 3.1*   No results for input(s): LIPASE, AMYLASE in the last 168 hours. No results for input(s): AMMONIA in the last 168 hours.  CBC:  Recent Labs Lab 03/05/17 0815 03/07/17 0453  WBC 8.6 13.0*  NEUTROABS 5.6  --   HGB 18.9* 19.1*  HCT 56.2* 56.8*  MCV 90.5 90.7  PLT 202 236    Cardiac Enzymes:  Recent Labs Lab 03/06/17 1203 03/06/17 1645 03/06/17 2230  TROPONINI 0.46* 0.50* 0.51*    Lipid Panel: No results for input(s): CHOL, TRIG, HDL, CHOLHDL, VLDL, LDLCALC in the last 168 hours.  CBG:  Recent Labs Lab 03/06/17 1643 03/06/17 2031 03/06/17 2351 03/07/17 0359 03/07/17 0815  GLUCAP 223* 223* 178* 191* 172*    Microbiology: Results for orders placed or  performed during the hospital encounter of 03/05/17  Culture, blood (Routine X 2) w Reflex to ID Panel     Status: None (Preliminary result)   Collection Time: 03/05/17 11:15 AM  Result Value Ref Range Status   Specimen Description BLOOD LEFT ARM  Final   Special Requests   Final    BOTTLES DRAWN AEROBIC AND ANAEROBIC Blood Culture results may not be optimal due to an inadequate volume of blood received in culture bottles   Culture NO GROWTH 2 DAYS  Final   Report Status PENDING  Incomplete  Culture, blood (Routine X 2) w Reflex to ID Panel     Status: None (Preliminary result)   Collection Time: 03/05/17 11:20 AM  Result Value Ref Range Status   Specimen Description BLOOD RIGHT HAND  Final   Special Requests   Final    BOTTLES DRAWN AEROBIC AND ANAEROBIC Blood Culture results may not be optimal due to an inadequate volume of blood received in culture bottles   Culture NO GROWTH 2 DAYS  Final   Report Status PENDING  Incomplete    Coagulation Studies:  Recent Labs  03/05/17 0815  LABPROT 13.0  INR 0.99    Imaging: Dg Chest Port 1 View  Result Date: 03/07/2017 CLINICAL DATA:  Dyspnea. EXAM: PORTABLE CHEST 1 VIEW COMPARISON:  None. FINDINGS: The cardiac silhouette is enlarged. Lung volumes are diminished with fine diffuse interstitial density bilaterally. No large pleural effusion or pneumothorax is identified. No acute osseous abnormality is seen. IMPRESSION: Cardiomegaly and low lung volumes with fine interstitial density bilaterally which may reflect viral/atypical infection or edema. Electronically Signed   By: Sebastian Ache M.D.   On: 03/07/2017 09:10       Assessment and plan per attending neurologist  Felicie Morn PA-C [facility] [phone]  03/07/2017, 9:26 AM   Assessment/Plan: 61 yo M with worsening mentation in the setting of intracranial abcess. No signs of seizure on EEG.  At this time, I think that his mental status is more of an ICP/cerebral  edema issue as opposed to seizure.  Neurosurgery/ID  is guiding for management of his abscesses.    1) EEG recommended(already performed at the time of this dictation, no ongoing seizure activity) 2) management of abscess per ID/neurosurgery  Ritta SlotMcNeill Leyda Vanderwerf, MD [facility] [phone]  If 7pm- 7am, please page neurology on call as listed in AMION.

## 2017-03-07 NOTE — Progress Notes (Signed)
PT transferred to 4N28. Report given at bedside. Family updated.

## 2017-03-07 NOTE — Progress Notes (Signed)
Patient ID: Timothy NuttingKelvin M Seim, male   DOB: 10/24/1955, 61 y.o.   MRN: [id number] Subjective:  The patient was recently intubated secondary to respiratory distress. He has been sedated. Presently he is in no apparent distress.  Objective: Vital signs in last 24 hours: Temp:  [97.9 F (36.6 C)-100.7 F (38.2 C)] 100.7 F (38.2 C) (10/28 1100) Pulse Rate:  [86-99] 99 (10/28 1100) Resp:  [18-39] 26 (10/28 1100) BP: (132-208)/(69-115) 135/76 (10/28 1100) SpO2:  [94 %-99 %] 99 % (10/28 1100) FiO2 (%):  [100 %] 100 % (10/28 1100) Weight:  [105.7 kg (233 lb 0.4 oz)] 105.7 kg (233 lb 0.4 oz) (10/28 1100)  Intake/Output from previous day: 10/27 0701 - 10/28 0700 In: 0  Out: 1020 [Urine:1020] Intake/Output this shift: No intake/output data recorded.  Physical exam the patient's pupils are equal. By report he is moving all 4 extremities requiring sedation, left greater than right.  I have reviewed the patient's [facility] The patient has 2 basal ganglia lesions, likely abscesses. I don't see much change when compared with his prior study of 03/05/2017.  Lab Results:  Recent Labs  03/05/17 0815 03/07/17 0453  WBC 8.6 13.0*  HGB 18.9* 19.1*  HCT 56.2* 56.8*  PLT 202 236   BMET  Recent Labs  03/05/17 0620 03/07/17 0643  NA 139 141  K 4.5 4.5  CL 108 110  CO2 23 18*  GLUCOSE 305* 144*  BUN 18 14  CREATININE 1.23 1.33*  CALCIUM 8.7* 8.5*    Studies/Results: Dg Chest 1 View  Result Date: 03/07/2017 CLINICAL DATA:  S/p intubation EXAM: CHEST  1 VIEW COMPARISON:  Earlier exam of same day FINDINGS: Endotracheal tube is been placed. Tip approximately 6 cm above carina. Low lung volumes. Perihilar interstitial opacities persist. Some increase in infrahilar airspace opacities since earlier study. Heart size and mediastinal contours are within normal limits. Blunting of lateral costophrenic angles possible small effusions. No pneumothorax.  Visualized bones unremarkable. IMPRESSION: 1. Endotracheal tube tip 6 cm above the above carina. 2. Slight progression of perihilar and infrahilar atelectasis/edema/infiltrates. Electronically Signed   By: Corlis Leak  Hassell M.D.   On: 03/07/2017 10:21   Dg Chest Port 1 View  Result Date: 03/07/2017 CLINICAL DATA:  Dyspnea. EXAM: PORTABLE CHEST 1 VIEW COMPARISON:  None. FINDINGS: The cardiac silhouette is enlarged. Lung volumes are diminished with fine diffuse interstitial density bilaterally. No large pleural effusion or pneumothorax is identified. No acute osseous abnormality is seen. IMPRESSION: Cardiomegaly and low lung volumes with fine interstitial density bilaterally which may reflect viral/atypical infection or edema. Electronically Signed   By: Sebastian AcheAllen  Grady M.D.   On: 03/07/2017 09:10    Assessment/Plan: Brain lesions: I have discussed the situation extensively with the patient's wife, 2 sons, and brother. I have told him that these lesions are likely infection, but tumor is possible. They understand that he is very ill and may not survive. We have discussed the various treatment options including continuing empiric antibiotics versus stereotactic aspiration of these lesions. I have described the surgical procedure which could be done tomorrow. We have discussed the risks including the risks of hemorrhage, stroke, death, failure to make a diagnosis, medical risks, etc. I have answered all their questions.  In the end they decided to continue with antibiotics for the time being and readdress the situation tomorrow, which seems reasonable.  LOS: 2 days     Aaradhya Kysar D 03/07/2017, 12:15 PM

## 2017-03-07 NOTE — Procedures (Signed)
Procedure: Emergent ET intubation Indication: Acute respiratory failure with inability to maintain a patent airway  The patient received initial sedation with fentanyl 50 mcg and etomidate 30 mg. Although the epiglottis was visualize with a Mac 4 blade, the cords could not be seen to successfully pass either an 8.0 or 7.5 ETT. As the patient retained motor strength at the mouth which compromised intubation, he was paralyzed with succinylcholine 80 mg and intubated using the GlideScope. Airway intubation was confirmed with the CO2 detector. The ETT was secured at 23 cm at the teeth. A CXR will pe repeated to confirm position.

## 2017-03-07 NOTE — Progress Notes (Signed)
EEG complete - results pending 

## 2017-03-07 NOTE — Progress Notes (Signed)
Assisted Dr. Roselyn Beringrim with intubation. Intubation attempted x's 3 by Dr. Roselyn Beringrim. Glidascope used on 3rd attempt & was successful. Positive ETc02, Bilaterial breath sounds, & visualized with the glidascope. Pt intubated with a 7.5 at 23.

## 2017-03-08 ENCOUNTER — Inpatient Hospital Stay (HOSPITAL_COMMUNITY)

## 2017-03-08 ENCOUNTER — Inpatient Hospital Stay (HOSPITAL_COMMUNITY): Admitting: Critical Care Medicine

## 2017-03-08 ENCOUNTER — Encounter (HOSPITAL_COMMUNITY): Payer: Self-pay | Admitting: Critical Care Medicine

## 2017-03-08 ENCOUNTER — Encounter (HOSPITAL_COMMUNITY)
Admission: EM | Disposition: A | Discharge status: Nursing Home (Includes ICF) | Payer: Self-pay | Source: Home / Self Care | Attending: Internal Medicine

## 2017-03-08 DIAGNOSIS — J9601 Acute respiratory failure with hypoxia: Secondary | ICD-10-CM

## 2017-03-08 DIAGNOSIS — E118 Type 2 diabetes mellitus with unspecified complications: Secondary | ICD-10-CM

## 2017-03-08 DIAGNOSIS — N179 Acute kidney failure, unspecified: Secondary | ICD-10-CM

## 2017-03-08 DIAGNOSIS — A419 Sepsis, unspecified organism: Secondary | ICD-10-CM

## 2017-03-08 DIAGNOSIS — Z794 Long term (current) use of insulin: Secondary | ICD-10-CM

## 2017-03-08 DIAGNOSIS — G060 Intracranial abscess and granuloma: Principal | ICD-10-CM

## 2017-03-08 DIAGNOSIS — R6521 Severe sepsis with septic shock: Secondary | ICD-10-CM

## 2017-03-08 HISTORY — PX: PR DURAL GRAFT REPAIR,SPINE DEFECT: 63710

## 2017-03-08 LAB — POCT I-STAT 3, ART BLOOD GAS (G3+)
Acid-base deficit: 9 mmol/L — ABNORMAL HIGH (ref 0.0–2.0)
BICARBONATE: 16.9 mmol/L — AB (ref 20.0–28.0)
O2 Saturation: 94 %
PH ART: 7.26 — AB (ref 7.350–7.450)
TCO2: 18 mmol/L — AB (ref 22–32)
pCO2 arterial: 37.6 mmHg (ref 32.0–48.0)
pO2, Arterial: 82 mmHg — ABNORMAL LOW (ref 83.0–108.0)

## 2017-03-08 LAB — BASIC METABOLIC PANEL
Anion gap: 8 (ref 5–15)
BUN: 30 mg/dL — AB (ref 6–20)
CO2: 20 mmol/L — ABNORMAL LOW (ref 22–32)
Calcium: 7.9 mg/dL — ABNORMAL LOW (ref 8.9–10.3)
Chloride: 114 mmol/L — ABNORMAL HIGH (ref 101–111)
Creatinine, Ser: 3.19 mg/dL — ABNORMAL HIGH (ref 0.61–1.24)
GFR calc Af Amer: 23 mL/min — ABNORMAL LOW (ref >60)
GFR, EST NON AFRICAN AMERICAN: 20 mL/min — AB (ref >60)
Glucose, Bld: 186 mg/dL — ABNORMAL HIGH (ref 65–99)
POTASSIUM: 3.7 mmol/L (ref 3.5–5.1)
SODIUM: 142 mmol/L (ref 135–145)

## 2017-03-08 LAB — GLUCOSE, CAPILLARY
GLUCOSE-CAPILLARY: 173 mg/dL — AB (ref 65–99)
GLUCOSE-CAPILLARY: 214 mg/dL — AB (ref 65–99)
GLUCOSE-CAPILLARY: 188 mg/dL — AB (ref 65–99)
Glucose-Capillary: 207 mg/dL — ABNORMAL HIGH (ref 65–99)
Glucose-Capillary: 197 mg/dL — ABNORMAL HIGH (ref 65–99)
Glucose-Capillary: 225 mg/dL — ABNORMAL HIGH (ref 65–99)

## 2017-03-08 LAB — SURGICAL PCR SCREEN
MRSA, PCR: NEGATIVE
Staphylococcus aureus: NEGATIVE

## 2017-03-08 LAB — LACTIC ACID, PLASMA
Lactic Acid, Venous: 1.5 mmol/L (ref 0.5–1.9)
Lactic Acid, Venous: 1.4 mmol/L (ref 0.5–1.9)

## 2017-03-08 LAB — CBC
HCT: 53.9 % — ABNORMAL HIGH (ref 39.0–52.0)
Hemoglobin: 17.5 g/dL — ABNORMAL HIGH (ref 13.0–17.0)
MCH: 29.4 pg (ref 26.0–34.0)
MCHC: 32.5 g/dL (ref 30.0–36.0)
MCV: 90.6 fL (ref 78.0–100.0)
PLATELETS: 192 10*3/uL (ref 150–400)
RBC: 5.95 MIL/uL — AB (ref 4.22–5.81)
RDW: 16.2 % — ABNORMAL HIGH (ref 11.5–15.5)
WBC: 11.5 10*3/uL — AB (ref 4.0–10.5)

## 2017-03-08 LAB — PROCALCITONIN: PROCALCITONIN: 3.36 ng/mL

## 2017-03-08 LAB — PHOSPHORUS: PHOSPHORUS: 4 mg/dL (ref 2.5–4.6)

## 2017-03-08 LAB — MAGNESIUM: MAGNESIUM: 2 mg/dL (ref 1.7–2.4)

## 2017-03-08 SURGERY — FRAMELESS STEREOTACTIC BIOPSY
Anesthesia: General | Site: Head | Laterality: Left

## 2017-03-08 MED ORDER — FENTANYL CITRATE (PF) 250 MCG/5ML IJ SOLN
INTRAMUSCULAR | Status: AC
  Filled 2017-03-08: qty 5

## 2017-03-08 MED ORDER — MUPIROCIN CALCIUM 2 % EX CREA
TOPICAL_CREAM | Freq: Two times a day (BID) | CUTANEOUS | Status: DC
  Administered 2017-03-08: 10:00:00 via TOPICAL
  Administered 2017-03-09 – 2017-03-10 (×3): 1 via TOPICAL
  Administered 2017-03-10 – 2017-03-11 (×2): via TOPICAL
  Administered 2017-03-11 – 2017-03-12 (×3): 1 via TOPICAL
  Administered 2017-03-12: 10:00:00 via TOPICAL
  Administered 2017-03-13: 1 via TOPICAL
  Administered 2017-03-13 – 2017-03-15 (×4): via TOPICAL
  Administered 2017-03-15 – 2017-03-16 (×2): 1 via TOPICAL
  Administered 2017-03-16 – 2017-03-17 (×2): via TOPICAL
  Administered 2017-03-17 – 2017-03-18 (×3): 1 via TOPICAL
  Administered 2017-03-19 – 2017-03-20 (×2): via TOPICAL
  Administered 2017-03-20: 1 via TOPICAL
  Administered 2017-03-20 – 2017-03-22 (×4): via TOPICAL
  Administered 2017-03-22: 1 via TOPICAL
  Administered 2017-03-23: 21:00:00 via TOPICAL
  Administered 2017-03-23: 1 via TOPICAL
  Administered 2017-03-24 (×2): via TOPICAL
  Filled 2017-03-08 (×2): qty 15

## 2017-03-08 MED ORDER — FENTANYL CITRATE (PF) 100 MCG/2ML IJ SOLN
INTRAMUSCULAR | Status: DC | PRN
  Administered 2017-03-08: 50 ug via INTRAVENOUS
  Administered 2017-03-08 (×2): 100 ug via INTRAVENOUS
  Administered 2017-03-08 (×5): 50 ug via INTRAVENOUS

## 2017-03-08 MED ORDER — PRO-STAT SUGAR FREE PO LIQD
30.0000 mL | Freq: Every day | ORAL | Status: DC
  Administered 2017-03-09 – 2017-03-23 (×14): 30 mL
  Filled 2017-03-08 (×14): qty 30

## 2017-03-08 MED ORDER — ENOXAPARIN SODIUM 30 MG/0.3ML ~~LOC~~ SOLN: 30.0000 mg | SUBCUTANEOUS | Status: DC

## 2017-03-08 MED ORDER — PHENYLEPHRINE HCL 10 MG/ML IJ SOLN
INTRAVENOUS | Status: DC | PRN
  Administered 2017-03-08: 10 ug/min via INTRAVENOUS

## 2017-03-08 MED ORDER — BUPIVACAINE-EPINEPHRINE 0.5% -1:200000 IJ SOLN
INTRAMUSCULAR | Status: DC | PRN
  Administered 2017-03-08: 2 mL

## 2017-03-08 MED ORDER — SODIUM CHLORIDE 0.9 % IV BOLUS (SEPSIS)
1000.0000 mL | Freq: Once | INTRAVENOUS | Status: AC
  Administered 2017-03-08: 1000 mL via INTRAVENOUS

## 2017-03-08 MED ORDER — LIDOCAINE-EPINEPHRINE 1 %-1:100000 IJ SOLN
INTRAMUSCULAR | Status: AC
  Filled 2017-03-08: qty 1

## 2017-03-08 MED ORDER — BUPIVACAINE-EPINEPHRINE (PF) 0.5% -1:200000 IJ SOLN
INTRAMUSCULAR | Status: AC
  Filled 2017-03-08: qty 30

## 2017-03-08 MED ORDER — VITAL HIGH PROTEIN PO LIQD
1000.0000 mL | ORAL | Status: DC
  Administered 2017-03-09 – 2017-03-22 (×13): 1000 mL
  Filled 2017-03-08: qty 1000

## 2017-03-08 MED ORDER — ROCURONIUM BROMIDE 100 MG/10ML IV SOLN
INTRAVENOUS | Status: DC | PRN
  Administered 2017-03-08 (×2): 40 mg via INTRAVENOUS

## 2017-03-08 MED ORDER — SODIUM BICARBONATE 8.4 % IV SOLN
INTRAVENOUS | Status: AC
  Administered 2017-03-08 – 2017-03-09 (×3): via INTRAVENOUS
  Filled 2017-03-08 (×7): qty 150

## 2017-03-08 MED ORDER — MIDAZOLAM HCL 5 MG/5ML IJ SOLN
INTRAMUSCULAR | Status: DC | PRN
  Administered 2017-03-08 (×2): 2 mg via INTRAVENOUS

## 2017-03-08 MED ORDER — THROMBIN (RECOMBINANT) 5000 UNITS EX SOLR
CUTANEOUS | Status: DC | PRN
  Administered 2017-03-08: 5 mL via TOPICAL

## 2017-03-08 MED ORDER — FENTANYL 2500MCG IN NS 250ML (10MCG/ML) PREMIX INFUSION
25.0000 ug/h | INTRAVENOUS | Status: DC
  Administered 2017-03-08: 25 ug/h via INTRAVENOUS
  Administered 2017-03-09: 75 ug/h via INTRAVENOUS
  Administered 2017-03-10 – 2017-03-14 (×5): 100 ug/h via INTRAVENOUS
  Administered 2017-03-14: 50 ug/h via INTRAVENOUS
  Administered 2017-03-14: 75 ug/h via INTRAVENOUS
  Administered 2017-03-14: 100 ug/h via INTRAVENOUS
  Administered 2017-03-16: 50 ug/h via INTRAVENOUS
  Administered 2017-03-19: 25 ug/h via INTRAVENOUS
  Filled 2017-03-08 (×11): qty 250

## 2017-03-08 MED ORDER — FENTANYL CITRATE (PF) 100 MCG/2ML IJ SOLN: 25.0000 ug | INTRAMUSCULAR | Status: DC | PRN

## 2017-03-08 MED ORDER — MEPERIDINE HCL 25 MG/ML IJ SOLN: 6.2500 mg | INTRAMUSCULAR | Status: DC | PRN

## 2017-03-08 MED ORDER — PROPOFOL 10 MG/ML IV BOLUS
INTRAVENOUS | Status: AC
  Filled 2017-03-08: qty 20

## 2017-03-08 MED ORDER — MIDAZOLAM HCL 2 MG/2ML IJ SOLN
INTRAMUSCULAR | Status: AC
  Filled 2017-03-08: qty 2

## 2017-03-08 MED ORDER — PHENYLEPHRINE 40 MCG/ML (10ML) SYRINGE FOR IV PUSH (FOR BLOOD PRESSURE SUPPORT)
PREFILLED_SYRINGE | INTRAVENOUS | Status: DC | PRN
  Administered 2017-03-08: 80 ug via INTRAVENOUS
  Administered 2017-03-08: 8 ug via INTRAVENOUS

## 2017-03-08 MED ORDER — SODIUM CHLORIDE 0.9 % IV SOLN
0.0000 ug/min | INTRAVENOUS | Status: DC
  Administered 2017-03-08: 20 ug/min via INTRAVENOUS
  Filled 2017-03-08 (×3): qty 1

## 2017-03-08 MED ORDER — SODIUM CHLORIDE 0.9 % IR SOLN
Status: DC | PRN
  Administered 2017-03-08: 500 mL

## 2017-03-08 MED ORDER — BACITRACIN ZINC 500 UNIT/GM EX OINT
TOPICAL_OINTMENT | CUTANEOUS | Status: DC | PRN
Start: 2017-03-08 — End: 2017-03-08
  Administered 2017-03-08: 1 via TOPICAL

## 2017-03-08 MED ORDER — PROPOFOL 10 MG/ML IV BOLUS
INTRAVENOUS | Status: DC | PRN
  Administered 2017-03-08: 40 mg via INTRAVENOUS
  Administered 2017-03-08: 30 mg via INTRAVENOUS

## 2017-03-08 MED ORDER — BACITRACIN ZINC 500 UNIT/GM EX OINT
TOPICAL_OINTMENT | CUTANEOUS | Status: AC
  Filled 2017-03-08: qty 28.35

## 2017-03-08 MED ORDER — DOCUSATE SODIUM 50 MG/5ML PO LIQD
100.0000 mg | Freq: Two times a day (BID) | ORAL | Status: DC | PRN
  Administered 2017-03-11 – 2017-03-16 (×3): 100 mg
  Filled 2017-03-08 (×3): qty 10

## 2017-03-08 MED ORDER — THROMBIN (RECOMBINANT) 5000 UNITS EX SOLR
CUTANEOUS | Status: AC
  Filled 2017-03-08: qty 5000

## 2017-03-08 MED ORDER — 0.9 % SODIUM CHLORIDE (POUR BTL) OPTIME
TOPICAL | Status: DC | PRN
  Administered 2017-03-08: 1000 mL

## 2017-03-08 MED ORDER — ONDANSETRON HCL 4 MG/2ML IJ SOLN: 4.0000 mg | Freq: Once | INTRAMUSCULAR | Status: DC | PRN

## 2017-03-08 SURGICAL SUPPLY — 56 items
BANDAGE ADH SHEER 1  50/CT (GAUZE/BANDAGES/DRESSINGS) IMPLANT
BLADE CLIPPER SURG (BLADE) IMPLANT
BLADE SURG 10 STRL SS (BLADE) ×3 IMPLANT
BLADE SURG 15 STRL LF DISP TIS (BLADE) ×1 IMPLANT
BLADE SURG 15 STRL SS (BLADE) ×2
BUR PRECISION FLUTE 6.0 (BURR) ×3 IMPLANT
CABLE BIPOLOR RESECTION CORD (MISCELLANEOUS) ×3 IMPLANT
CANISTER SUCT 3000ML PPV (MISCELLANEOUS) ×3 IMPLANT
CARTRIDGE OIL MAESTRO DRILL (MISCELLANEOUS) ×1 IMPLANT
COVER MAYO STAND STRL (DRAPES) ×3 IMPLANT
DIFFUSER DRILL AIR PNEUMATIC (MISCELLANEOUS) ×3 IMPLANT
DRAPE INCISE IOBAN 66X45 STRL (DRAPES) ×3 IMPLANT
DRAPE NEUROLOGICAL W/INCISE (DRAPES) ×3 IMPLANT
DRAPE POUCH INSTRU U-SHP 10X18 (DRAPES) ×3 IMPLANT
DURAPREP 26ML APPLICATOR (WOUND CARE) IMPLANT
ELECT CAUTERY BLADE 6.4 (BLADE) ×3 IMPLANT
ELECT REM PT RETURN 9FT ADLT (ELECTROSURGICAL) ×3
ELECTRODE REM PT RTRN 9FT ADLT (ELECTROSURGICAL) ×1 IMPLANT
GAUZE SPONGE 4X4 12PLY STRL LF (GAUZE/BANDAGES/DRESSINGS) ×3 IMPLANT
GAUZE SPONGE 4X4 16PLY XRAY LF (GAUZE/BANDAGES/DRESSINGS) ×3 IMPLANT
GLOVE BIO SURGEON STRL SZ8 (GLOVE) ×3 IMPLANT
GLOVE BIO SURGEON STRL SZ8.5 (GLOVE) ×3 IMPLANT
GLOVE BIOGEL PI IND STRL 6.5 (GLOVE) ×1 IMPLANT
GLOVE BIOGEL PI INDICATOR 6.5 (GLOVE) ×2
GLOVE EXAM NITRILE LRG STRL (GLOVE) IMPLANT
GLOVE EXAM NITRILE XL STR (GLOVE) IMPLANT
GLOVE EXAM NITRILE XS STR PU (GLOVE) IMPLANT
GLOVE SURG SS PI 6.5 STRL IVOR (GLOVE) ×3 IMPLANT
GOWN STRL REUS W/ TWL LRG LVL3 (GOWN DISPOSABLE) ×1 IMPLANT
GOWN STRL REUS W/ TWL XL LVL3 (GOWN DISPOSABLE) ×1 IMPLANT
GOWN STRL REUS W/TWL LRG LVL3 (GOWN DISPOSABLE) ×2
GOWN STRL REUS W/TWL XL LVL3 (GOWN DISPOSABLE) ×2
HEMOSTAT POWDER KIT SURGIFOAM (HEMOSTASIS) ×3 IMPLANT
KIT BASIN OR (CUSTOM PROCEDURE TRAY) ×3 IMPLANT
KIT NEEDLE BIOPSY 1.8X235 CRAN (NEEDLE) ×3 IMPLANT
KIT ROOM TURNOVER OR (KITS) ×3 IMPLANT
MARKER SPHERE PSV REFLC 13MM (MARKER) ×9 IMPLANT
NEEDLE HYPO 25X1 1.5 SAFETY (NEEDLE) ×3 IMPLANT
NS IRRIG 1000ML POUR BTL (IV SOLUTION) ×3 IMPLANT
OIL CARTRIDGE MAESTRO DRILL (MISCELLANEOUS) ×3
PACK EENT II TURBAN DRAPE (CUSTOM PROCEDURE TRAY) ×3 IMPLANT
PAD ARMBOARD 7.5X6 YLW CONV (MISCELLANEOUS) ×9 IMPLANT
PATTIES SURGICAL .5 X.5 (GAUZE/BANDAGES/DRESSINGS) ×3 IMPLANT
PENCIL BUTTON HOLSTER BLD 10FT (ELECTRODE) ×3 IMPLANT
STAPLER SKIN PROX WIDE 3.9 (STAPLE) ×3 IMPLANT
SUT BONE WAX W31G (SUTURE) ×3 IMPLANT
SUT VIC AB 2-0 CP2 18 (SUTURE) ×3 IMPLANT
SYR 5ML LUER SLIP (SYRINGE) ×6 IMPLANT
SYR BULB 3OZ (MISCELLANEOUS) ×3 IMPLANT
SYR CONTROL 10ML LL (SYRINGE) ×3 IMPLANT
TAPE CLOTH SURG 4X10 WHT LF (GAUZE/BANDAGES/DRESSINGS) ×3 IMPLANT
TOWEL GREEN STERILE (TOWEL DISPOSABLE) ×3 IMPLANT
TOWEL GREEN STERILE FF (TOWEL DISPOSABLE) ×3 IMPLANT
TUBE CONNECTING 12'X1/4 (SUCTIONS) ×1
TUBE CONNECTING 12X1/4 (SUCTIONS) ×2 IMPLANT
WATER STERILE IRR 1000ML POUR (IV SOLUTION) ×3 IMPLANT

## 2017-03-08 NOTE — Progress Notes (Signed)
Patient ID: Timothy NuttingKelvin M Jordan, male   DOB: 1955-06-06, 61 y.o.   MRN: [id number] I spoke with the patient's wife and 2 sons. 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 We have discussed the various treatment options including continued medical management, withdrawal support, and a stereotactic aspiration of his left basal ganglial lesions. I have again described the procedure to them. We have discussed the risks including the risks of fatal bleeding, stroke, unsuccessful aspiration of the lesion, failure to make the diagnosis, medical risks, etc. We have discussed the benefits, i.e. of relieving pressure on his brain and possibly confirming what type of infection he has, etc. I have answered all their questions. They understand the situation and wish to proceed with the stereotactic aspiration of his left basal ganglial lesion. We will plan to proceed in the near future.

## 2017-03-08 NOTE — Progress Notes (Signed)
Initial Nutrition Assessment  DOCUMENTATION CODES:   Obesity unspecified  INTERVENTION:  - Will order Vital High Protein @ 55 mL/hr with 30 mL Prostat once/day. This regimen provides 1420 kcal, 130 grams of protein, and 1103 mL free water. - Will adjust TF regimen if D5 is not lowered or changed.   NUTRITION DIAGNOSIS:   Inadequate oral intake related to inability to eat as evidenced by NPO status.  GOAL:   Provide needs based on ASPEN/SCCM guidelines  MONITOR:   Vent status, TF tolerance, Weight trends, Labs  REASON FOR ASSESSMENT:   Ventilator, Consult Enteral/tube feeding initiation and management  ASSESSMENT:   61 y.o. male with medical history significant of diabetes mellitus type 2 and hypertension who presents with altered mentation. For last 48 hours patient has been developing progressive confusion and disorientation, to the point where he was noted to have dis-balance, falls and hallucinations. His confusion is moderate to severe in intensity, it was associated with slurred speech and right upper extremity weakness  BMI indicates obesity. OGT in place. Family outside of the room talking with other medical personnel concerning upcoming surgery. Consult received to initiate TF following surgery this afternoon. Per Dr. Priscille LovelessHammond's note this AM, [facility] and showed 2 cystic masses in the L basal ganglia with surrounding vasogenic edema; brain abscess was confirmed by MRI. Plan for IV antibiotics and stereotatic aspiration. No previous weight hx available since 02/19/14 (pt weighted 231 lbs on that date).   Patient is currently intubated on ventilator support MV: 15.3 L/min Temp (24hrs), Avg:100.1 F (37.8 C), Min:98.1 F (36.7 C), Max:102.2 F (39 C) Propofol: none BP: 126/89 and MAP: 102  Medications reviewed; sliding scale Novolog, 10 units Lantus/day,  Labs reviewed; CBG: 197 mg/dL this AM, Cl: 161114 mmol/L, BUN: 30 mg/dL, creatinine: 0.963.19 mg/dl, Ca: 7.9  mg/dL, GFR: 23 mL/min.   IVF: D5-150 mEq sodium bicarb @ 125 mL/hr (510 kcal from dextrose).  Drips: Neo @ 25 mcg/min, Fentanyl @ 50 mcg/hr.      NUTRITION - FOCUSED PHYSICAL EXAM: No muscle or fat wasting, mild edema to extremities.   Diet Order:  Diet NPO time specified  EDUCATION NEEDS:   Not appropriate for education at this time  Skin:  Skin Assessment: Reviewed RN Assessment  Last BM:  PTA/unknown  Height:   Ht Readings from Last 1 Encounters:  03/05/17 5\' 6"  (1.676 m)    Weight:   Wt Readings from Last 1 Encounters:  03/08/17 242 lb 11.6 oz (110.1 kg)    Ideal Body Weight:  64.54 kg  BMI:  Body mass index is 39.18 kg/m.  Estimated Nutritional Needs:   Kcal:  [id number] (11-14 kcal/kg)  Protein:  129 grams (2 grams/kg)  Fluid:  >/= 1.5 L/day     Trenton GammonJessica Seraya Jobst, MS, RD, LDN, [facility] Inpatient Clinical Dietitian Pager # [phone] After hours/weekend pager # [phone]

## 2017-03-08 NOTE — Anesthesia Procedure Notes (Signed)
Arterial Line Insertion Performed by: Tillman AbideHAWKINS, Hollyn Stucky B, CRNA  Preanesthetic checklist: patient identified, IV checked, site marked, risks and benefits discussed, surgical consent, monitors and equipment checked, pre-op evaluation and timeout performed Patient sedated radial was placed Catheter size: 20 G Hand hygiene performed , maximum sterile barriers used  and Seldinger technique used Allen's test indicative of satisfactory collateral circulation Attempts: 1 Procedure performed without using ultrasound guided technique. Following insertion, dressing applied and Biopatch. Post procedure assessment: normal  Patient tolerated the procedure well with no immediate complications.

## 2017-03-08 NOTE — Addendum Note (Signed)
Addendum  created 03/08/17 1739 by Tillman AbideHawkins, Flannery Cavallero B, CRNA   Anesthesia Intra Meds edited

## 2017-03-08 NOTE — Progress Notes (Signed)
[facility] for Infectious Disease  Date of Admission:  03/05/2017     Total days of antibiotics  4  Ceftriaxone 10/26 >>  Metronidazole 10/26 >>  Vancomycin 10/26 >> 10/28                 ASSESSMENT: 61 yo AA male with newly discovered left basal ganglia lesions in setting of AMS - HIV negative 03/05/17  Ventilatory Failure - intubated   AKI  - Creatinine 1.33 --> 3.11 overnight. Vanc D/C'd   PLAN: 1. Appreciate surgical intervention 2. Continue current antibiotics until more information from OR cultures. With diabetic history would send aerobic/anaerobic as well as fungal cultures.   Janene Madeira, MSN, NP-C [facility] Pager: [phone]  03/08/2017  1:13 PM     Principal Problem:   Brain abscess Active Problems:   Acute metabolic encephalopathy   Essential hypertension   Diabetes mellitus ([place])   Elevated troponin   Multiple brain abscesses   . aspirin EC  81 mg Oral Daily  . chlorhexidine gluconate (MEDLINE KIT)  15 mL Mouth Rinse BID  . enoxaparin (LOVENOX) injection  30 mg Subcutaneous Q24H  . etomidate  0.3 mg/kg Intravenous Once  . feeding supplement (PRO-STAT SUGAR FREE 64)  30 mL Per Tube Daily  . fentaNYL (SUBLIMAZE) injection  50 mcg Intravenous Once  . insulin aspart  0-20 Units Subcutaneous Q4H  . insulin glargine  10 Units Subcutaneous Daily  . mouth rinse  15 mL Mouth Rinse 10 times per day  . metoprolol tartrate  5 mg Intravenous Q8H  . mupirocin cream   Topical BID  . pantoprazole (PROTONIX) IV  40 mg Intravenous QHS    SUBJECTIVE: Interval Hx: Intubated over weekend - unresponsive on ventilator. No family at the bedside currently. Dr. Arnoldo Morale has been in discussion with Mr. Withey' family regarding stereotactic aspiration of left basal ganglial lesions. He is right hemiplegic and with worsening renal failure. Fever last PM ~102deg. WBC 11.5.    Review of  Systems: Review of Systems  Unable to perform ROS: Critical illness    No Known Allergies  OBJECTIVE: Vitals:   03/08/17 1200 03/08/17 1215 03/08/17 1230 03/08/17 1245  BP: 135/77 140/74 125/77 124/78  Pulse: 82 84 82 84  Resp: (!) 21 (!) 21 20 (!) 21  Temp: 98.9 F (37.2 C)     TempSrc: Axillary     SpO2: 99% 98% 98% 97%  Weight:      Height:       Body mass index is 39.18 kg/m.  Physical Exam  Constitutional: He is well-developed, well-nourished, and in no distress.  HENT:  Mouth/Throat: No oral lesions. Normal dentition. No dental caries.  Eyes: Pupils are equal, round, and reactive to light. No scleral icterus.  Cardiovascular: Normal rate, regular rhythm and normal heart sounds.   Pulmonary/Chest: Effort normal and breath sounds normal.  Abdominal: Soft. He exhibits no distension. There is no tenderness.  Lymphadenopathy:    He has no cervical adenopathy.  Neurological:  Heavily sedated on ventilator. Non-responsive.   Skin: Skin is warm and dry. No rash noted.  Psychiatric: Mood and affect normal.    Microbiology: BCx 10/26 >> NG x 3d MRSA Nasal PCR >> negative  Sputum Cx 10/28 >> pending, polymicrobial on GS  Lab Results Lab Results  Component Value Date   WBC 11.5 (H) 03/08/2017   HGB 17.5 (H) 03/08/2017  HCT 53.9 (H) 03/08/2017   MCV 90.6 03/08/2017   PLT 192 03/08/2017    Lab Results  Component Value Date   CREATININE 3.19 (H) 03/08/2017   BUN 30 (H) 03/08/2017   NA 142 03/08/2017   K 3.7 03/08/2017   CL 114 (H) 03/08/2017   CO2 20 (L) 03/08/2017    Lab Results  Component Value Date   ALT 38 03/05/2017   AST 30 03/05/2017   ALKPHOS 154 (H) 03/05/2017   BILITOT 1.0 03/05/2017

## 2017-03-08 NOTE — Progress Notes (Signed)
Patient ID: Timothy Jordan, male   DOB: 07-12-1955, 61 y.o.   MRN: [id number] Subjective:  The patient's sedation has been turned off for the last 20 minutes or so. He is in no apparent distress. He is in renal failure.  Objective: Vital signs in last 24 hours: Temp:  [98.1 F (36.7 C)-102.2 F (39 C)] 98.1 F (36.7 C) (10/29 0401) Pulse Rate:  [75-99] 78 (10/29 0500) Resp:  [18-39] 20 (10/29 0500) BP: (68-208)/(50-115) 90/65 (10/29 0500) SpO2:  [94 %-100 %] 98 % (10/29 0500) FiO2 (%):  [40 %-100 %] 40 % (10/29 0350) Weight:  [105.7 kg (233 lb 0.4 oz)-110.1 kg (242 lb 11.6 oz)] 110.1 kg (242 lb 11.6 oz) (10/29 0400)  Intake/Output from previous day: 10/28 0701 - 10/29 0700 In: 3520.2 [I.V.:2020.2; IV Piggyback:1500] Out: 700 [Urine:700] Intake/Output this shift: No intake/output data recorded.  Physical exam the patient is arousable, Glasgow Coma Scale 8 intubated, [id number]. The patient localizes and is purposeful on the left. He is right hemiplegic. His pupils are equal.  Lab Results:  Recent Labs  03/07/17 0453 03/08/17 0319  WBC 13.0* 11.5*  HGB 19.1* 17.5*  HCT 56.8* 53.9*  PLT 236 192   BMET  Recent Labs  03/07/17 0643 03/08/17 0319  NA 141 142  K 4.5 3.7  CL 110 114*  CO2 18* 20*  GLUCOSE 144* 186*  BUN 14 30*  CREATININE 1.33* 3.19*  CALCIUM 8.5* 7.9*    Studies/Results: Dg Chest 1 View  Result Date: 03/07/2017 CLINICAL DATA:  S/p intubation EXAM: CHEST  1 VIEW COMPARISON:  Earlier exam of same day FINDINGS: Endotracheal tube is been placed. Tip approximately 6 cm above carina. Low lung volumes. Perihilar interstitial opacities persist. Some increase in infrahilar airspace opacities since earlier study. Heart size and mediastinal contours are within normal limits. Blunting of lateral costophrenic angles possible small effusions. No pneumothorax. Visualized bones unremarkable. IMPRESSION: 1. Endotracheal tube tip 6 cm above the above carina. 2. Slight  progression of perihilar and infrahilar atelectasis/edema/infiltrates. Electronically Signed   By: Corlis Leak M.D.   On: 03/07/2017 10:21   Ct Head Wo Contrast  Result Date: 03/07/2017 CLINICAL DATA:  Brain abscesses.  Mental status change. EXAM: CT HEAD WITHOUT CONTRAST TECHNIQUE: Contiguous axial images were obtained from the base of the skull through the vertex without intravenous contrast. COMPARISON:  Head CT and MRI 03/05/2017 FINDINGS: Brain: Low-density masses in the left basal ganglia region appears stable to slightly larger compared to the prior CT, measuring 2.2 x 1.8 cm and 2.5 x 1.6 cm. Surrounding edema has increased, and there is now 5 mm of rightward midline shift with progressive effacement of the left lateral ventricle. There is no evidence of acute large territory infarct, intracranial hemorrhage, or extra-axial fluid collection. Cerebral volume is within normal limits for age. Confluent hypodensity elsewhere in the cerebral white matter bilaterally is unchanged and nonspecific but may reflect chronic small vessel ischemic disease, greatly advanced for age. Vascular: No hyperdense vessel. Skull: No fracture or focal osseous lesion. Sinuses/Orbits: Right maxillary sinus mucous retention cyst. Bilateral proptosis. Other: Partially visualized endotracheal tube. IMPRESSION: Stable to slight enlargement of left basal ganglia masses compatible with abscesses on MRI. Worsening surrounding edema with 5 mm of rightward midline shift. Electronically Signed   By: Sebastian Ache M.D.   On: 03/07/2017 12:25   Dg Chest Port 1 View  Result Date: 03/07/2017 CLINICAL DATA:  Dyspnea. EXAM: PORTABLE CHEST 1 VIEW COMPARISON:  None. FINDINGS: The  cardiac silhouette is enlarged. Lung volumes are diminished with fine diffuse interstitial density bilaterally. No large pleural effusion or pneumothorax is identified. No acute osseous abnormality is seen. IMPRESSION: Cardiomegaly and low lung volumes with fine  interstitial density bilaterally which may reflect viral/atypical infection or edema. Electronically Signed   By: Sebastian AcheAllen  Grady M.D.   On: 03/07/2017 09:10   Dg Abd Portable 1v  Result Date: 03/07/2017 CLINICAL DATA:  Encounter for orogastric (OG) tube placement EXAM: PORTABLE ABDOMEN - 1 VIEW COMPARISON:  None. FINDINGS: OG tube appears adequately positioned in the stomach with tip directed towards the stomach antrum/pylorus. Visualized bowel gas pattern is nonobstructive. IMPRESSION: OG tube appears adequately positioned in the stomach with tip directed towards the stomach antrum/pylorus region. Electronically Signed   By: Bary RichardStan  Maynard M.D.   On: 03/07/2017 12:40    Assessment/Plan: Left basal ganglia lesions: The patient is clinically worsening neurologically. I will again offer a stereotactic aspiration of these presumed abscesses if critical care feels he is medically stable. I am not sure how much this will help but perhaps we can make the diagnosis and fine-tune his antibiotics. The main risk of the stereotactic aspiration would be bleeding which would likely be fatal. Alternatively we can "stay the course" with antibiotics. I am awaiting his wife's arrival. I am told she is coming in this morning. I have tentatively put him on the OR schedule.  LOS: 3 days     Caylor Cerino D 03/08/2017, 7:57 AM

## 2017-03-08 NOTE — Anesthesia Preprocedure Evaluation (Signed)
Anesthesia Evaluation  Patient identified by MRN, date of birth, ID band Patient awake    Reviewed: Allergy & Precautions, NPO status , Patient's Chart, lab work & pertinent test results  Airway Mallampati: II  TM Distance: >3 FB Neck ROM: Full    Dental no notable dental hx.    Pulmonary neg pulmonary ROS, former smoker,    Pulmonary exam normal breath sounds clear to auscultation       Cardiovascular hypertension, Pt. on medications negative cardio ROS Normal cardiovascular exam Rhythm:Regular Rate:Normal     Neuro/Psych negative neurological ROS  negative psych ROS   GI/Hepatic negative GI ROS, Neg liver ROS,   Endo/Other  negative endocrine ROSdiabetes, Insulin Dependent  Renal/GU negative Renal ROS  negative genitourinary   Musculoskeletal negative musculoskeletal ROS (+)   Abdominal   Peds negative pediatric ROS (+)  Hematology negative hematology ROS (+)   Anesthesia Other Findings Echo 10/18  Study Conclusions  - Left ventricle: The cavity size was at the upper limits of   normal. Wall thickness was increased in a pattern of mild LVH.   Systolic function was normal. The estimated ejection fraction was   in the range of 55% to 60%. - Left atrium: The atrium was moderately dilated. - Impressions: Study stopped prematurely by tech as patient would   not lie still. No apical images avaialble. limited windows. EF   appears grossly normal.  Reproductive/Obstetrics negative OB ROS                             Anesthesia Physical Anesthesia Plan  ASA: III  Anesthesia Plan: General   Post-op Pain Management:    Induction: Intravenous  PONV Risk Score and Plan: 2 and Ondansetron and Treatment may vary due to age or medical condition  Airway Management Planned: Oral ETT  Additional Equipment:   Intra-op Plan:   Post-operative Plan: Extubation in OR and Possible Post-op  intubation/ventilation  Informed Consent:   Plan Discussed with:   Anesthesia Plan Comments: (  )        Anesthesia Quick Evaluation

## 2017-03-08 NOTE — Progress Notes (Signed)
eLink Physician-Brief Progress Note Patient Name: Timothy NuttingKelvin M Merkle DOB: [birth year] MRN: [id number]   Date of Service  03/08/2017  HPI/Events of Note  Oliguria - Bladder scan with about 250 mL residual. Last LVEF = 55% to 60%.  eICU Interventions  Will order: 1. Bolus with 0.9 NaCl 1 liter IV over 1 hour now..     Intervention Category Intermediate Interventions: Oliguria - evaluation and management  Dariush Mcnellis Eugene 03/08/2017, 1:38 AM

## 2017-03-08 NOTE — Op Note (Signed)
Brief history: The patient is a 61 year old diabetic black male who had dental work done about 2 weeks ago. The patient had mental status changes. He was worked up and found to have what appeared to be left basal ganglia brain abscesses. Peripheral cultures were negative. The patient was started on empiric antibiotics. The patient has had a progressive neurologic decline. I discussed the situation extensively with the patient's family. We discussed the various treatment options including a stereotactic aspiration of the lesion. They have elected to proceed with that surgery.  Preoperative diagnosis: Left basal ganglia lesion  Postop diagnosis: The same  Procedure: Image guided stereotactic aspiration of left basal ganglia lesion using the BrainLab neuro navigation system.  Surgeon: Dr. Delma OfficerJeff Mintie Witherington  Assistant: None  Anesthesia: Gen. endotracheal  Past metabolic loss: Minimal  Specimens: Cultures and purulent material to pathology  Complications: None  Drains: None  Description of procedure: The patient was brought to the operating room by the anesthesia team. General endotracheal anesthesia was induced. The patient remained in the supine position. I applied the [place] 3. headrest to the patient's calvarium. His head and neck were kept in the neutral position. The registration frame was applied. We registered the patient's service anatomy to the BrainLab system using the laser. Entry point and trajectories were previously formulated with a slight medial to lateral trajectory to avoid the insular vessels.  The patient's scalp was then prepared with Betadine scrub and Betadine solution. Sterile drapes were applied. We assembled the stereotactic apparatus. We then adjusted the Varioguide to our entry point and target point. I injected the area to be incised with Marcaine with epinephrine solution. I used a scalpel to make a small incision in the patient's scalp over the entry point. We inserted  the Weitlanter retractor for exposure. I used a high-speed drill to create a frontal burr hole at approximately the coronal suture. I coagulated the underlying dura. I then inserted the brain needle into the left thalamic lesion without difficulty. Aspirate of the lesion and got approximately 3 mL of frankly purulent material. Specimens were sent to microbiology for culture and to pathology for identification and to rule out tumor cells. I did not see any evidence of bleeding in the aspirate. The needle was removed. The wound was irrigated. I removed the Weitlanter retractor. I reapproximated the galea with interrupted 2-0 Vicryl suture. I reapproximated the skin with stainless steel staples. The wound was then coated with bacitracin ointment. A sterile dressing was applied. The stereotactic apparatus was removed The drapes were removed. By report all sponge, instrument, and needle counts were correct at the end of this case.

## 2017-03-08 NOTE — Progress Notes (Signed)
eLink Physician-Brief Progress Note Patient Name: Ludger NuttingKelvin M Ousley DOB: 1956-05-11 MRN: [id number]   Date of Service  03/08/2017  HPI/Events of Note  Oliguria - Bladder residual still only 290 mL.  eICU Interventions  Will order: 1. Bolus with 0.9 NaCl 1 liter over 1 hour now.      Intervention Category Intermediate Interventions: Oliguria - evaluation and management  Christoffer Currier Eugene 03/08/2017, 4:49 AM

## 2017-03-08 NOTE — Progress Notes (Signed)
PULMONARY / CRITICAL CARE MEDICINE   Name: Timothy Jordan MRN: [id number] DOB: 1955-12-17    ADMISSION DATE:  03/05/2017 CONSULTATION DATE:  03/05/17  REFERRING MD:  Rod Can  CHIEF COMPLAINT:  Hypopnea with impending respiratory failure  HISTORY OF PRESENT ILLNESS:   This is a 61 y.o. B M admitted 03/05/2017 after presenting with altered MS. The patient had apparently been developing progressive confusion and disorientation with ataxia and hallucinations, along with dysarthria and RUE weakness. There were no associated fevers or rigors. Upon presenting to the ED at [facility] a head CT was performed indicating two cystic masses LEFT basal ganglia with surrounding vasogenic edema, suggestive of a brain abscess, which was confirmed by MRI. He was then Tx to [facility] for further management.  Neurosurgery recommended conservative management with Abx, considering the risks of stereotactic aspiration. Current ABxs are Rocephin + Flagyl per ID. Of note is that the patient had some teeth extraction ~ 1 month ago and did receive prophylactic Abxs. Intubated on 10/28 for apneic episodes and concern not protecting airway. Overnight developed T:102.2, Low UOP, and Shock. He was given 2L IVF bolus and started on phenylephrine gtt.   PAST MEDICAL HISTORY :  He  has a past medical history of Diabetes mellitus without complication (HCC).  PAST SURGICAL HISTORY: He  has a past surgical history that includes Hernia repair and Achilles tendon repair.  No Known Allergies  No current facility-administered medications on file prior to encounter.    Current Outpatient Prescriptions on File Prior to Encounter  Medication Sig  . aspirin EC 81 MG tablet Take 81 mg by mouth daily.  . insulin glargine (LANTUS) 100 UNIT/ML injection Inject 70 Units into the skin every morning.   FAMILY HISTORY:  His has no family status information on file.   SOCIAL HISTORY: He  reports that he has quit smoking. He has  never used smokeless tobacco. He reports that he does not drink alcohol.  REVIEW OF SYSTEMS:   Review of Systems  Unable to perform ROS: Critical illness   SUBJECTIVE:  Intubated and sedated   VITAL SIGNS: BP 114/69   Pulse 73   Temp 98.1 F (36.7 C) (Axillary)   Resp 18   Ht 5\' 6"  (1.676 m)   Wt 110.1 kg (242 lb 11.6 oz)   SpO2 99%   BMI 39.18 kg/m   HEMODYNAMICS:  phenylephrine @ 30  VENTILATOR SETTINGS: Vent Mode: PRVC FiO2 (%):  [40 %-100 %] 40 % Set Rate:  [16 bmp] 16 bmp Vt Set:  [550 mL] 550 mL PEEP:  [5 cmH20] 5 cmH20 Plateau Pressure:  [16 cmH20-18 cmH20] 18 cmH20  INTAKE / OUTPUT: I/O last 3 completed shifts: In: 3815.2 [I.V.:2315.2; IV Piggyback:1500] Out: 1030 [Urine:1020; Emesis/NG output:10]  PHYSICAL EXAMINATION: General:  WDWN male, intubated and sedated, critically ill Neuro: Somnolent (propofol on hold), Pupils 4mm and reactive bilaterally, Grimaces and turns head in response to pain, not obeying commands HEENT: orally intubated, scant ETT secretions, moderate oral secretions, OP clear Cardiovascular: RRR no m/r/g Lungs: Coarse breath sounds b/l Abdomen: Soft NTND GU: condom catheter in place Musculoskeletal: no LE edema Skin: no rashes   LABS:  BMET  Recent Labs Lab 03/05/17 0620 03/07/17 0643 03/08/17 0319  NA 139 141 142  K 4.5 4.5 3.7  CL 108 110 114*  CO2 23 18* 20*  BUN 18 14 30*  CREATININE 1.23 1.33* 3.19*  GLUCOSE 305* 144* 186*    Electrolytes  Recent  Labs Lab 03/05/17 0620 03/07/17 0643 03/08/17 0319  CALCIUM 8.7* 8.5* 7.9*   CBC  Recent Labs Lab 03/05/17 0815 03/07/17 0453 03/08/17 0319  WBC 8.6 13.0* 11.5*  HGB 18.9* 19.1* 17.5*  HCT 56.2* 56.8* 53.9*  PLT 202 236 192   Coag's  Recent Labs Lab 03/05/17 0815  APTT <20*  INR 0.99   Sepsis Markers  Recent Labs Lab 03/07/17 1256  PROCALCITON 1.02   ABG  Recent Labs Lab 03/07/17 1102  PHART 7.295*  PCO2ART 41.6  PO2ART 128.0*    Liver Enzymes  Recent Labs Lab 03/05/17 0620  AST 30  ALT 38  ALKPHOS 154*  BILITOT 1.0  ALBUMIN 3.1*   Cardiac Enzymes  Recent Labs Lab 03/06/17 1203 03/06/17 1645 03/06/17 2230  TROPONINI 0.46* 0.50* 0.51*   Glucose  Recent Labs Lab 03/07/17 0359 03/07/17 0815 03/07/17 2003 03/07/17 2330 03/08/17 0339 03/08/17 0808  GLUCAP 191* 172* 237* 181* 207* 197*   Imaging Dg Chest 1 View  Result Date: 03/07/2017 CLINICAL DATA:  S/p intubation EXAM: CHEST  1 VIEW COMPARISON:  Earlier exam of same day FINDINGS: Endotracheal tube is been placed. Tip approximately 6 cm above carina. Low lung volumes. Perihilar interstitial opacities persist. Some increase in infrahilar airspace opacities since earlier study. Heart size and mediastinal contours are within normal limits. Blunting of lateral costophrenic angles possible small effusions. No pneumothorax. Visualized bones unremarkable. IMPRESSION: 1. Endotracheal tube tip 6 cm above the above carina. 2. Slight progression of perihilar and infrahilar atelectasis/edema/infiltrates. Electronically Signed   By: Corlis Leak  Hassell M.D.   On: 03/07/2017 10:21   Ct Head Wo Contrast  Result Date: 03/07/2017 CLINICAL DATA:  Brain abscesses.  Mental status change. EXAM: CT HEAD WITHOUT CONTRAST TECHNIQUE: Contiguous axial images were obtained from the base of the skull through the vertex without intravenous contrast. COMPARISON:  Head CT and MRI 03/05/2017 FINDINGS: Brain: Low-density masses in the left basal ganglia region appears stable to slightly larger compared to the prior CT, measuring 2.2 x 1.8 cm and 2.5 x 1.6 cm. Surrounding edema has increased, and there is now 5 mm of rightward midline shift with progressive effacement of the left lateral ventricle. There is no evidence of acute large territory infarct, intracranial hemorrhage, or extra-axial fluid collection. Cerebral volume is within normal limits for age. Confluent hypodensity elsewhere in  the cerebral white matter bilaterally is unchanged and nonspecific but may reflect chronic small vessel ischemic disease, greatly advanced for age. Vascular: No hyperdense vessel. Skull: No fracture or focal osseous lesion. Sinuses/Orbits: Right maxillary sinus mucous retention cyst. Bilateral proptosis. Other: Partially visualized endotracheal tube. IMPRESSION: Stable to slight enlargement of left basal ganglia masses compatible with abscesses on MRI. Worsening surrounding edema with 5 mm of rightward midline shift. Electronically Signed   By: Sebastian AcheAllen  Grady M.D.   On: 03/07/2017 12:25   Dg Chest Port 1 View  Result Date: 03/07/2017 CLINICAL DATA:  Dyspnea. EXAM: PORTABLE CHEST 1 VIEW COMPARISON:  None. FINDINGS: The cardiac silhouette is enlarged. Lung volumes are diminished with fine diffuse interstitial density bilaterally. No large pleural effusion or pneumothorax is identified. No acute osseous abnormality is seen. IMPRESSION: Cardiomegaly and low lung volumes with fine interstitial density bilaterally which may reflect viral/atypical infection or edema. Electronically Signed   By: Sebastian AcheAllen  Grady M.D.   On: 03/07/2017 09:10   Dg Abd Portable 1v  Result Date: 03/07/2017 CLINICAL DATA:  Encounter for orogastric (OG) tube placement EXAM: PORTABLE ABDOMEN - 1 VIEW COMPARISON:  None. FINDINGS: OG tube appears adequately positioned in the stomach with tip directed towards the stomach antrum/pylorus. Visualized bowel gas pattern is nonobstructive. IMPRESSION: OG tube appears adequately positioned in the stomach with tip directed towards the stomach antrum/pylorus region. Electronically Signed   By: Bary Richard M.D.   On: 03/07/2017 12:40   STUDIES:  Head CT (10/28): IMPRESSION: Stable to slight enlargement of left basal ganglia masses compatible with abscesses on MRI. Worsening surrounding edema with 5 mm of rightward midline shift.  CULTURES: Blood cultures (10/26): no growth Sputum culture (10/28):  rare GPC's in pairs; rare GNR's MRSA nares: negative Blood cultures (10/28): pending UA (10/26): negative  ANTIBIOTICS: Ceftriaxone (10/26) >> Metronidazole (10/26) >> Vancomycin 10/26>>10/27  SIGNIFICANT EVENTS: 10/28: Progressive decline in MS requiring intubation 10/29: Fever and shock overnight, requiring addition of phenylephrine gtt  LINES/TUBES: Periph IV; ETT  DISCUSSION: 61 y.o. M with L basal ganglia brain abscess. Per my assessment it was uncertain that the patient is able to maintain a patent airway, and therefore electively intubated. The patient was assessed by Neurology who requested a repeat head CT. Will accept in tx to CCM service for airway management in the ICU.  ASSESSMENT / PLAN:  PULMONARY 1. Acute Hypoxic Respiratory failure: - CXR (10/28): pulmonary edema; questionable retrocardiac infiltrate - CXR (10/29): pending - Continue mechanical ventilation; not candidate for SBT's at this time.  - Plan to start tube feeds following surgery today.  - continue GI and DVT prophylaxis - ABG today shows a metabolic acidosis (see plan below); no vent changes made  CARDIOVASCULAR 1. Shock: thought to be septic; see plan below.  RENAL 1. AKI; Metabolic acidosis:  - creatinine 3.19 this AM, up from 1.33 on 10/28 and from 1.23 on 10/26 - remove condom catheter and place foley cathter - ABG showed metabolic acidosis (unclear if due to AKI alone or if component of lactic acidosis); change NS gtt to Bicarb gtt - monitor UOP; avoid nephrotoxic agents  GASTROINTESTINAL No active issues   HEMATOLOGIC No active issues   INFECTIOUS 1. Septic shock: due to brain abscesses - febrile overnight and BP dropped to 70's/50's with low UOP; bolused 2L NS. Started Phenylephrine gtt - check lactate and recheck procalcitonin now.  - will likely need a CVC if doesn't wean off his current low dose pressors, after which can monitor CVP's - change propofol gtt to fentanyl  gtt to minimize iatrogenic hypotension.   ENDOCRINE 1. Type II DM: - continue SSI - start tube feeds following surgery   NEUROLOGIC .1 Acute Encephalopathy; Brain abscesses; Cerebral edema: - worsening acute enceophalopathy (requiring intubation) due to brain abscesses with worsening surrounding edema with 5mm R to midline shift on Head CT 10/28 - Appreciate Neurosurgery rec's    60 minutes nonprocedural critical care time  Milana Obey, MD  Pulmonary and Critical Care Medicine [facility] Pager: [phone]  03/08/2017, 8:43 AM

## 2017-03-08 NOTE — Transfer of Care (Signed)
Immediate Anesthesia Transfer of Care Note  Patient: Timothy Jordan  Procedure(s) Performed: Timothy MenghiniFRAMLESS STERIOTACTIC BIOPSY WITH STEALTH (Left Head)  Patient Location: ICU  Anesthesia Type:General  Level of Consciousness: sedated and Patient remains intubated per anesthesia plan  Airway & Oxygen Therapy: Patient remains intubated per anesthesia plan and Patient placed on Ventilator (see vital sign flow sheet for setting)  Post-op Assessment: Report given to RN and Post -op Vital signs reviewed and stable  Post vital signs: Reviewed and stable  Last Vitals:  Vitals:   03/08/17 1610 03/08/17 1611  BP:    Pulse:    Resp: 12 12  Temp:    SpO2:      Last Pain:  Vitals:   03/08/17 1200  TempSrc: Axillary  PainSc:          Complications: No apparent anesthesia complications

## 2017-03-08 NOTE — Care Management Note (Signed)
Case Management Note  Patient Details  Name: Ludger NuttingKelvin M Borum MRN: [id number] Date of Birth: [birth year]  Subjective/Objective:  Pt admitted on 03/05/17 with neurological decline; found to have Lt basal ganglia abscesses.  PTA, pt resided at home with spouse; has two sons.                     Action/Plan: Pt to OR today for stereotactic aspiration of lesions.  Will follow post-op for discharge needs.    Expected Discharge Date:                  Expected Discharge Plan:     In-House Referral:     Discharge planning Services  CM Consult  Post Acute Care Choice:    Choice offered to:     DME Arranged:    DME Agency:     HH Arranged:    HH Agency:     Status of Service:  In process, will continue to follow  If discussed at Long Length of Stay Meetings, dates discussed:    Additional Comments:  Quintella BatonJulie W. Paysley Poplar, RN, BSN  Trauma/Neuro ICU Case Manager [phone]

## 2017-03-08 NOTE — Progress Notes (Signed)
RN called and updated CCMD of pt. Urine output still 0 cc for shift. RN received orders and will continue to monitor.

## 2017-03-08 NOTE — Progress Notes (Signed)
eLink Physician-Brief Progress Note Patient Name: Timothy NuttingKelvin M Jordan DOB: July 25, 1955 MRN: [id number]   Date of Service  03/08/2017  HPI/Events of Note  Hypotension - BP = 74/55. Patient is still getting 0.9 NaCl 1 liter IV fluid bolus.   eICU Interventions  Will order: 1. Phenylephrine IV infusion. Titrate to MAP > 65.     Intervention Category Major Interventions: Hypotension - evaluation and management  Trudie Cervantes Eugene 03/08/2017, 2:39 AM

## 2017-03-08 NOTE — Progress Notes (Signed)
Inpatient Diabetes Program Recommendations  AACE/ADA: New Consensus Statement on Inpatient Glycemic Control (2015)  Target Ranges:  Prepandial:   less than 140 mg/dL      Peak postprandial:   less than 180 mg/dL (1-2 hours)      Critically ill patients:  140 - 180 mg/dL   Lab Results  Component Value Date   GLUCAP 207 (H) 03/08/2017   HGBA1C 9.9 (H) 03/06/2017    Review of Glycemic Control Results for Timothy NuttingSTOKES, Timothy M (MRN [id number]) as of 03/08/2017 08:01  Ref. Range 03/07/2017 03:59 03/07/2017 08:15 03/07/2017 20:03 03/07/2017 23:30 03/08/2017 03:39  Glucose-Capillary Latest Ref Range: 65 - 99 mg/dL 409191 (H) 811172 (H) 914237 (H) 181 (H) 207 (H)   Diabetes history: DM2 Outpatient Diabetes medications: Lantus 70 units qd + Metformin 1 gm bid Current orders for Inpatient glycemic control: Lantus 10 units + Novolog resistant correction q 4 hrs.  Inpatient Diabetes Program Recommendations:    Agree with current orders @ present. If CBGs increase or tube feeds started, please consider IV insulin via glucostabilizer. Will follow.  Thank you, Timothy FischerJudy E. Alric Geise, RN, MSN, CDE  Diabetes Coordinator Inpatient Glycemic Control Team Team Pager [phone] (8am-5pm) 03/08/2017 8:04 AM

## 2017-03-08 NOTE — Consult Note (Signed)
WOC Nurse wound consult note Reason for Consult: nonhealing trauma wound to right anterior lower leg.  Noted as skin tear, but peeling epithelium is consistent with serum filled blistering.  This is beneath SCDs.  Pink foam is in place for protection.  Wound type:Trauma Pressure Injury POA: NA Measurement:4 cm x 2.4 cm x 0.2 cm  Wound ZOX:WRUEbed:pink and friable.  Peeling epithelium noted at edges.  Drainage (amount, consistency, odor) minimal serosanguinous  No odor.  Periwound:intact  SCDs in place bilaterally Dressing procedure/placement/frequency:Cleanse wound to right anterior lower leg with NS and pat dry.  Apply mupirocin ointment to wound bed.  Twice daily.  Cover with silicone border foam.   Will not follow at this time.  Please re-consult if needed.  Maple HudsonKaren Burnice Vassel RN BSN CWON Pager [phone]

## 2017-03-08 NOTE — Anesthesia Postprocedure Evaluation (Signed)
Anesthesia Post Note  Patient: Timothy Jordan  Procedure(s) Performed: [name]     Patient location during evaluation: PACU Anesthesia Type: General Level of consciousness: awake and alert Pain management: pain level controlled Vital Signs Assessment: post-procedure vital signs reviewed and stable Respiratory status: spontaneous breathing, nonlabored ventilation, respiratory function stable and patient connected to nasal cannula oxygen Cardiovascular status: blood pressure returned to baseline and stable Postop Assessment: no apparent nausea or vomiting Anesthetic complications: no    Last Vitals:  Vitals:   03/08/17 1611 03/08/17 1628  BP:    Pulse:    Resp: 12   Temp:    SpO2:  96%    Last Pain:  Vitals:   03/08/17 1200  TempSrc: Axillary  PainSc:                  Bibiana Gillean

## 2017-03-09 ENCOUNTER — Encounter (HOSPITAL_COMMUNITY): Payer: Self-pay | Admitting: Neurosurgery

## 2017-03-09 DIAGNOSIS — J988 Other specified respiratory disorders: Secondary | ICD-10-CM

## 2017-03-09 LAB — POCT I-STAT 7, (LYTES, BLD GAS, ICA,H+H)
Acid-base deficit: 4 mmol/L — ABNORMAL HIGH (ref 0.0–2.0)
Acid-base deficit: 6 mmol/L — ABNORMAL HIGH (ref 0.0–2.0)
BICARBONATE: 23.9 mmol/L (ref 20.0–28.0)
Bicarbonate: 21.5 mmol/L (ref 20.0–28.0)
CALCIUM ION: 1.18 mmol/L (ref 1.15–1.40)
Calcium, Ion: 1.12 mmol/L — ABNORMAL LOW (ref 1.15–1.40)
HCT: 53 % — ABNORMAL HIGH (ref 39.0–52.0)
HEMATOCRIT: 52 % (ref 39.0–52.0)
Hemoglobin: 17.7 g/dL — ABNORMAL HIGH (ref 13.0–17.0)
Hemoglobin: 18 g/dL — ABNORMAL HIGH (ref 13.0–17.0)
O2 Saturation: 89 %
O2 Saturation: 92 %
PO2 ART: 60 mmHg — AB (ref 83.0–108.0)
POTASSIUM: 3.7 mmol/L (ref 3.5–5.1)
Patient temperature: 35.7
Potassium: 6.6 mmol/L (ref 3.5–5.1)
SODIUM: 149 mmol/L — AB (ref 135–145)
Sodium: 146 mmol/L — ABNORMAL HIGH (ref 135–145)
TCO2: 25 mmol/L (ref 22–32)
TCO2: 23 mmol/L (ref 22–32)
pCO2 arterial: 49.9 mmHg — ABNORMAL HIGH (ref 32.0–48.0)
pCO2 arterial: 50.1 mmHg — ABNORMAL HIGH (ref 32.0–48.0)
pH, Arterial: 7.24 — ABNORMAL LOW (ref 7.350–7.450)
pH, Arterial: 7.281 — ABNORMAL LOW (ref 7.350–7.450)
pO2, Arterial: 74 mmHg — ABNORMAL LOW (ref 83.0–108.0)

## 2017-03-09 LAB — GLUCOSE, CAPILLARY
GLUCOSE-CAPILLARY: 233 mg/dL — AB (ref 65–99)
GLUCOSE-CAPILLARY: 191 mg/dL — AB (ref 65–99)
GLUCOSE-CAPILLARY: 203 mg/dL — AB (ref 65–99)
Glucose-Capillary: 180 mg/dL — ABNORMAL HIGH (ref 65–99)
Glucose-Capillary: 188 mg/dL — ABNORMAL HIGH (ref 65–99)
Glucose-Capillary: 206 mg/dL — ABNORMAL HIGH (ref 65–99)
Glucose-Capillary: 232 mg/dL — ABNORMAL HIGH (ref 65–99)
Glucose-Capillary: 241 mg/dL — ABNORMAL HIGH (ref 65–99)

## 2017-03-09 LAB — BASIC METABOLIC PANEL
ANION GAP: 9 (ref 5–15)
BUN: 31 mg/dL — ABNORMAL HIGH (ref 6–20)
CALCIUM: 7.8 mg/dL — AB (ref 8.9–10.3)
CHLORIDE: 112 mmol/L — AB (ref 101–111)
CO2: 24 mmol/L (ref 22–32)
Creatinine, Ser: 2.89 mg/dL — ABNORMAL HIGH (ref 0.61–1.24)
GFR calc non Af Amer: 22 mL/min — ABNORMAL LOW (ref >60)
GFR, EST AFRICAN AMERICAN: 26 mL/min — AB (ref >60)
Glucose, Bld: 198 mg/dL — ABNORMAL HIGH (ref 65–99)
Potassium: 3.4 mmol/L — ABNORMAL LOW (ref 3.5–5.1)
Sodium: 145 mmol/L (ref 135–145)

## 2017-03-09 LAB — CBC
HEMATOCRIT: 51.9 % (ref 39.0–52.0)
HEMOGLOBIN: 16.7 g/dL (ref 13.0–17.0)
MCH: 29.4 pg (ref 26.0–34.0)
MCHC: 32.2 g/dL (ref 30.0–36.0)
MCV: 91.4 fL (ref 78.0–100.0)
Platelets: 180 10*3/uL (ref 150–400)
RBC: 5.68 MIL/uL (ref 4.22–5.81)
RDW: 16.6 % — ABNORMAL HIGH (ref 11.5–15.5)
WBC: 11.1 10*3/uL — AB (ref 4.0–10.5)

## 2017-03-09 LAB — PROCALCITONIN: PROCALCITONIN: 1.89 ng/mL

## 2017-03-09 LAB — CULTURE, RESPIRATORY W GRAM STAIN: Culture: NORMAL

## 2017-03-09 LAB — CULTURE, RESPIRATORY: SPECIAL REQUESTS: NORMAL

## 2017-03-09 MED ORDER — SODIUM CHLORIDE 0.9 % IV SOLN: INTRAVENOUS | Status: DC

## 2017-03-09 MED ORDER — FUROSEMIDE 10 MG/ML IJ SOLN
20.0000 mg | Freq: Once | INTRAMUSCULAR | Status: AC
  Administered 2017-03-09: 20 mg via INTRAVENOUS
  Filled 2017-03-09: qty 2

## 2017-03-09 MED ORDER — BISACODYL 10 MG RE SUPP
10.0000 mg | Freq: Every day | RECTAL | Status: DC | PRN
  Administered 2017-03-15 – 2017-03-18 (×2): 10 mg via RECTAL
  Filled 2017-03-09 (×2): qty 1

## 2017-03-09 MED ORDER — ENOXAPARIN SODIUM 30 MG/0.3ML ~~LOC~~ SOLN
30.0000 mg | SUBCUTANEOUS | Status: DC
  Administered 2017-03-09 – 2017-03-11 (×3): 30 mg via SUBCUTANEOUS
  Filled 2017-03-09 (×3): qty 0.3

## 2017-03-09 MED ORDER — POTASSIUM CHLORIDE 20 MEQ/15ML (10%) PO SOLN
40.0000 meq | Freq: Two times a day (BID) | ORAL | Status: AC
  Administered 2017-03-09 (×2): 40 meq
  Filled 2017-03-09 (×2): qty 30

## 2017-03-09 MED ORDER — SENNOSIDES-DOCUSATE SODIUM 8.6-50 MG PO TABS
1.0000 | ORAL_TABLET | Freq: Two times a day (BID) | ORAL | Status: AC
  Administered 2017-03-09 – 2017-03-10 (×4): 1 via ORAL
  Filled 2017-03-09 (×4): qty 1

## 2017-03-09 MED ORDER — METOPROLOL TARTRATE 5 MG/5ML IV SOLN: 5.0000 mg | Freq: Four times a day (QID) | INTRAVENOUS | Status: DC | PRN

## 2017-03-09 MED ORDER — BISACODYL 10 MG RE SUPP
10.0000 mg | Freq: Once | RECTAL | Status: AC
  Administered 2017-03-09: 10 mg via RECTAL
  Filled 2017-03-09: qty 1

## 2017-03-09 MED ORDER — CHLORHEXIDINE GLUCONATE 0.12 % MT SOLN
OROMUCOSAL | Status: AC
  Filled 2017-03-09: qty 15

## 2017-03-09 NOTE — Progress Notes (Signed)
Patient ID: Timothy Jordan, male   DOB: 1955-10-30, 61 y.o.   MRN: [id number] Subjective:  The patient is a bit more alert today.  Objective: Vital signs in last 24 hours: Temp:  [97.7 F (36.5 C)-98.9 F (37.2 C)] 98.4 F (36.9 C) (10/30 0400) Pulse Rate:  [70-96] 84 (10/30 0700) Resp:  [12-25] 16 (10/30 0700) BP: (96-153)/(65-118) 123/71 (10/30 0700) SpO2:  [93 %-100 %] 97 % (10/30 0700) Arterial Line BP: (78-153)/(46-68) 121/62 (10/30 0700) FiO2 (%):  [40 %-70 %] 60 % (10/30 0700) Weight:  [112 kg (246 lb 14.6 oz)] 112 kg (246 lb 14.6 oz) (10/30 0456)  Intake/Output from previous day: 10/29 0701 - 10/30 0700 In: 4229.2 [I.V.:4229.2] Out: 1970 [Urine:1240; Emesis/NG output:700; Blood:30] Intake/Output this shift: No intake/output data recorded.  Physical exam Glasgow Coma Scale of 9 intubated, [id number]. He is purposeful on the left. He is right hemiplegic. His pupils are small but equal.  Lab Results:  Recent Labs  03/08/17 0319 03/09/17 0421  WBC 11.5* 11.1*  HGB 17.5* 16.7  HCT 53.9* 51.9  PLT 192 180   BMET  Recent Labs  03/08/17 0319 03/09/17 0421  NA 142 145  K 3.7 3.4*  CL 114* 112*  CO2 20* 24  GLUCOSE 186* 198*  BUN 30* 31*  CREATININE 3.19* 2.89*  CALCIUM 7.9* 7.8*    Studies/Results: Dg Chest 1 View  Result Date: 03/07/2017 CLINICAL DATA:  S/p intubation EXAM: CHEST  1 VIEW COMPARISON:  Earlier exam of same day FINDINGS: Endotracheal tube is been placed. Tip approximately 6 cm above carina. Low lung volumes. Perihilar interstitial opacities persist. Some increase in infrahilar airspace opacities since earlier study. Heart size and mediastinal contours are within normal limits. Blunting of lateral costophrenic angles possible small effusions. No pneumothorax. Visualized bones unremarkable. IMPRESSION: 1. Endotracheal tube tip 6 cm above the above carina. 2. Slight progression of perihilar and infrahilar atelectasis/edema/infiltrates. Electronically  Signed   By: Corlis Leak M.D.   On: 03/07/2017 10:21   Ct Head Wo Contrast  Result Date: 03/07/2017 CLINICAL DATA:  Brain abscesses.  Mental status change. EXAM: CT HEAD WITHOUT CONTRAST TECHNIQUE: Contiguous axial images were obtained from the base of the skull through the vertex without intravenous contrast. COMPARISON:  Head CT and MRI 03/05/2017 FINDINGS: Brain: Low-density masses in the left basal ganglia region appears stable to slightly larger compared to the prior CT, measuring 2.2 x 1.8 cm and 2.5 x 1.6 cm. Surrounding edema has increased, and there is now 5 mm of rightward midline shift with progressive effacement of the left lateral ventricle. There is no evidence of acute large territory infarct, intracranial hemorrhage, or extra-axial fluid collection. Cerebral volume is within normal limits for age. Confluent hypodensity elsewhere in the cerebral white matter bilaterally is unchanged and nonspecific but may reflect chronic small vessel ischemic disease, greatly advanced for age. Vascular: No hyperdense vessel. Skull: No fracture or focal osseous lesion. Sinuses/Orbits: Right maxillary sinus mucous retention cyst. Bilateral proptosis. Other: Partially visualized endotracheal tube. IMPRESSION: Stable to slight enlargement of left basal ganglia masses compatible with abscesses on MRI. Worsening surrounding edema with 5 mm of rightward midline shift. Electronically Signed   By: Sebastian Ache M.D.   On: 03/07/2017 12:25   US Renal  Result Date: 03/08/2017 CLINICAL DATA:  Acute renal failure x1 day.  Patient is ventilated. EXAM: RENAL / URINARY TRACT ULTRASOUND COMPLETE COMPARISON:  None. FINDINGS: Right Kidney: Length: 13.4 cm. Echogenicity within normal limits. No mass or  hydronephrosis visualized. No obstructive uropathy. Trace perinephric fluid. Left Kidney: Length: 13 cm. Echogenicity within normal limits. Hypoechoic interpolar 2.2 x 1.3 by 1.9 cm and lower pole 2.9 x 2.1 x 2.3 cm cyst likely  complex with internal echoes are noted. No obstructive uropathy. Trace perinephric fluid. Bladder: Decompressed by Foley. IMPRESSION: 1. Complex left-sided renal cysts. 2. No obstructive uropathy or loss of cortical-medullary distinction to explain the patient's acute renal failure. 3. Trace perinephric fluid is seen about both kidneys. Electronically Signed   By: Tollie Ethavid  Kwon M.D.   On: 03/08/2017 22:51   Dg Chest Port 1 View  Result Date: 03/08/2017 CLINICAL DATA:  Hypoxia EXAM: PORTABLE CHEST 1 VIEW COMPARISON:  March 07, 2017 FINDINGS: Endotracheal tube tip is 4.6 cm above the carina. Nasogastric tube tip and side port are in stomach. There is bibasilar atelectasis, more on the right than the left a small right pleural effusion. There is cardiomegaly with pulmonary vascularity within normal limits. No adenopathy. No bone lesions. IMPRESSION: Tube positions as described without pneumothorax. Stable cardiomegaly. Small right pleural effusion with bibasilar atelectasis. No frank consolidation noted. Electronically Signed   By: Bretta BangWilliam  Woodruff III M.D.   On: 03/08/2017 09:16   Dg Chest Port 1 View  Result Date: 03/07/2017 CLINICAL DATA:  Dyspnea. EXAM: PORTABLE CHEST 1 VIEW COMPARISON:  None. FINDINGS: The cardiac silhouette is enlarged. Lung volumes are diminished with fine diffuse interstitial density bilaterally. No large pleural effusion or pneumothorax is identified. No acute osseous abnormality is seen. IMPRESSION: Cardiomegaly and low lung volumes with fine interstitial density bilaterally which may reflect viral/atypical infection or edema. Electronically Signed   By: Sebastian AcheAllen  Grady M.D.   On: 03/07/2017 09:10   Dg Abd Portable 1v  Result Date: 03/07/2017 CLINICAL DATA:  Encounter for orogastric (OG) tube placement EXAM: PORTABLE ABDOMEN - 1 VIEW COMPARISON:  None. FINDINGS: OG tube appears adequately positioned in the stomach with tip directed towards the stomach antrum/pylorus. Visualized  bowel gas pattern is nonobstructive. IMPRESSION: OG tube appears adequately positioned in the stomach with tip directed towards the stomach antrum/pylorus region. Electronically Signed   By: Bary RichardStan  Maynard M.D.   On: 03/07/2017 12:40    Assessment/Plan: Postop day #1: We will await the results of the culture. The patient is slightly improved neurologically.  LOS: 4 days     Daneil Beem D 03/09/2017, 7:38 AM

## 2017-03-09 NOTE — Progress Notes (Signed)
[facility]  [facility] will follow pt with ID team to support IV ABX at DC as/if ordered.  If patient discharges after hours, please call [phone].   Sedalia Mutaamela S Chandler 03/09/2017, 8:35 AM

## 2017-03-09 NOTE — Progress Notes (Signed)
PULMONARY / CRITICAL CARE MEDICINE   Name: Timothy Jordan MRN: [id number] DOB: 1955/10/28    ADMISSION DATE:  03/05/2017 CONSULTATION DATE:  03/05/17  REFERRING MD:  Dr. Isidoro Donning   CHIEF COMPLAINT:  Respiratory Failure  HISTORY OF PRESENT ILLNESS:   61 y.o. B M admitted 03/05/2017 after presenting with altered MS. The patient had apparently been developing progressive confusion and disorientation with ataxia and hallucinations, along with dysarthria and RUE weakness. There were no associated fevers or rigors. Upon presenting to the ED at [facility] a head CT was performed indicating two cystic masses LEFT basal ganglia with surrounding vasogenic edema, suggestive of a brain abscess, which was confirmed by MRI. He was then Tx to [facility] for further management. Neurosurgery recommended conservative management with Abx, considering the risks of stereotactic aspiration. Current ABxs are Rocephin + Flagyl per ID. Of note is that the patient had some teeth extraction ~ 1 month ago and did receive prophylactic Abxs.  Intubated on 10/28 for apneic episodes and concern not protecting airway. Overnight developed T:102.2, Low UOP, and Shock. He was given 2L IVF bolus and started on phenylephrine gtt.  SUBJECTIVE:  RN reports pt remains on bicarb gtt, fentanyl.  Periods of intermittent agitation.  No BM.  Fever to 101.7 / WBC 11.1.  5.9L positive since admit.   VITAL SIGNS: BP (!) 147/98 (BP Location: Left Leg)   Pulse 97   Temp (!) 101.7 F (38.7 C) (Rectal) Comment: tylenol given  Resp 18   Ht 5\' 6"  (1.676 m)   Wt 246 lb 14.6 oz (112 kg)   SpO2 97%   BMI 39.85 kg/m   HEMODYNAMICS:    VENTILATOR SETTINGS: Vent Mode: PRVC FiO2 (%):  [40 %-70 %] 50 % Set Rate:  [16 bmp] 16 bmp Vt Set:  [550 mL-650 mL] 650 mL PEEP:  [5 cmH20] 5 cmH20 Pressure Support:  [5 cmH20] 5 cmH20 Plateau Pressure:  [15 cmH20-21 cmH20] 15 cmH20  INTAKE / OUTPUT: I/O last 3 completed shifts: In: 6968 [I.V.:5718; IV  Piggyback:1250] Out: 1980 [Urine:1240; Emesis/NG output:710; Blood:30]  PHYSICAL EXAMINATION: General:  Obese adult male in NAD on vent HEENT: MM pink/moist, ETT Neuro: sedate, opens eyes to voice, no follow commands, pupils 3mm, movement noted on L, none on RLE, flicker on RUE CV: s1s2 rrr, no m/r/g PULM: even/non-labored, lungs bilaterally clear  WU:JWJX, non-tender, bsx4 active  Extremities: warm/dry, trace generalized edema  Skin: no rashes or lesions.  Mild erythema noted to RUE, RLE.  RLE dressing with large abrasion on anterior lower leg / wound bed clean  LABS:  BMET  Recent Labs Lab 03/07/17 0643 03/08/17 0319 03/09/17 0421  NA 141 142 145  K 4.5 3.7 3.4*  CL 110 114* 112*  CO2 18* 20* 24  BUN 14 30* 31*  CREATININE 1.33* 3.19* 2.89*  GLUCOSE 144* 186* 198*    Electrolytes  Recent Labs Lab 03/07/17 0643 03/08/17 0319 03/08/17 0818 03/09/17 0421  CALCIUM 8.5* 7.9*  --  7.8*  MG  --   --  2.0  --   PHOS  --   --  4.0  --     CBC  Recent Labs Lab 03/07/17 0453 03/08/17 0319 03/09/17 0421  WBC 13.0* 11.5* 11.1*  HGB 19.1* 17.5* 16.7  HCT 56.8* 53.9* 51.9  PLT 236 192 180    Coag's  Recent Labs Lab 03/05/17 0815  APTT <20*  INR 0.99    Sepsis Markers  Recent Labs Lab  03/07/17 1256 03/08/17 0818 03/08/17 1131 03/09/17 0421  LATICACIDVEN  --  1.5 1.4  --   PROCALCITON 1.02 3.36  --  1.89    ABG  Recent Labs Lab 03/07/17 1102 03/08/17 0848  PHART 7.295* 7.260*  PCO2ART 41.6 37.6  PO2ART 128.0* 82.0*    Liver Enzymes  Recent Labs Lab 03/05/17 0620  AST 30  ALT 38  ALKPHOS 154*  BILITOT 1.0  ALBUMIN 3.1*    Cardiac Enzymes  Recent Labs Lab 03/06/17 1203 03/06/17 1645 03/06/17 2230  TROPONINI 0.46* 0.50* 0.51*    Glucose  Recent Labs Lab 03/08/17 0808 03/08/17 1159 03/08/17 1622 03/08/17 1930 03/08/17 2326 03/09/17 0417  GLUCAP 197* 225* 173* 214* 188* 191*    Imaging [place] Renal  Result Date:  03/08/2017 CLINICAL DATA:  Acute renal failure x1 day.  Patient is ventilated. EXAM: RENAL / URINARY TRACT ULTRASOUND COMPLETE COMPARISON:  None. FINDINGS: Right Kidney: Length: 13.4 cm. Echogenicity within normal limits. No mass or hydronephrosis visualized. No obstructive uropathy. Trace perinephric fluid. Left Kidney: Length: 13 cm. Echogenicity within normal limits. Hypoechoic interpolar 2.2 x 1.3 by 1.9 cm and lower pole 2.9 x 2.1 x 2.3 cm cyst likely complex with internal echoes are noted. No obstructive uropathy. Trace perinephric fluid. Bladder: Decompressed by Foley. IMPRESSION: 1. Complex left-sided renal cysts. 2. No obstructive uropathy or loss of cortical-medullary distinction to explain the patient's acute renal failure. 3. Trace perinephric fluid is seen about both kidneys. Electronically Signed   By: Tollie Ethavid  Kwon M.D.   On: 03/08/2017 22:51   Dg Chest Port 1 View  Result Date: 03/08/2017 CLINICAL DATA:  Hypoxia EXAM: PORTABLE CHEST 1 VIEW COMPARISON:  March 07, 2017 FINDINGS: Endotracheal tube tip is 4.6 cm above the carina. Nasogastric tube tip and side port are in stomach. There is bibasilar atelectasis, more on the right than the left a small right pleural effusion. There is cardiomegaly with pulmonary vascularity within normal limits. No adenopathy. No bone lesions. IMPRESSION: Tube positions as described without pneumothorax. Stable cardiomegaly. Small right pleural effusion with bibasilar atelectasis. No frank consolidation noted. Electronically Signed   By: Bretta BangWilliam  Woodruff III M.D.   On: 03/08/2017 09:16     STUDIES:  ECHO 10/27 >> study stopped prematurely due to pt movement, mild LVH, LVEF 55-60% CT Head 10/28 >> Stable to slight enlargement of left basal ganglia masses compatible with abscesses on MRI. Worsening surrounding edema with 5 mm of rightward midline shift Renal US 10/29 >> complex left-sided renal cysts, no obstructive uropathy, trace perinephric fluid is seen  about both kidneys  CULTURES: BCx2 10/26 >>  Sputum 10/26 >>  BCx2 10/28 >>  UA 10/26 >> negative HIV 10/26 >> negative  Brain Abscess 10/29 >>   ANTIBIOTICS: Ceftriaxone 10/26 >> Metronidazole 10/26 >> Vancomycin 10/26 >> 10/27  SIGNIFICANT EVENTS: 10/28 Progressive decline in MS requiring intubation 10/29 Fever and shock overnight, requiring addition of phenylephrine gtt.  Brain abscess aspiration  LINES/TUBES: ETT 10/28 >>   DISCUSSION: 61 y/o M with L basal ganglia brain abscess. Per my assessment it was uncertain that the patient is able to maintain a patent airway, and therefore electively intubated. The patient was assessed by Neurology who requested a repeat head CT. Will accept in tx to CCM service for airway management in the ICU.  ASSESSMENT / PLAN:  PULMONARY A: Acute Hypoxic Respiratory Failure  P:   PRVC 8 cc/kg  Wean PEEP / FiO2 for sats > 92% Intermittent CXR  Daily SBT / WUA as tolerated  Lasix 20 mg IV x1  CARDIOVASCULAR A:  Septic Shock - in setting of brain abscess, resolved.  Off pressors 10/29.  HTN P:  ICU monitoring  Discontinue Neo from [facility]  D/c aline  Change lopressor 5mg  IV Q8 to PRN given hypotension / neo requirement in last 24 hours  RENAL A:   AKI - suspect in setting of shock/hypotension, sepsis.  Renal US negative for obstructive uropathy but showed complex L renal cyst.  Metabolic Acidosis  Hypokalemia  P:   Discontinue bicarbonate gtt  IVF to NS @ 50  Trend BMP / urinary output Replace electrolytes as indicated Avoid nephrotoxic agents, ensure adequate renal perfusion  GASTROINTESTINAL A:   Obesity  Constipation  P:   NPO  OGT  TF per Nutrition  Senokot-S BID x4 doses Dulcolax suppository now, then PRN  Continue PRN colace  HEMATOLOGIC A:   Leukocytosis  P:  Lovenox for DVT prophylaxis  Trend CBC   Monitor for bleeding  INFECTIOUS A:   Brain Abscess - presumed odontogenic source  Fever - suspect  secondary to above P:   ID following, appreciate input  ABX as above Follow cultures Discontinue foley > apply condom cath Place PICC as will likely need long term abx  ENDOCRINE A:   DM II    P:   SSI with resistant scale Lantus 10 units QD  NEUROLOGIC A:   Acute Encephalopathy in setting of Brain Abscess  Left Basal Ganglia Lesion s/p Stereotactic Aspiration (10/29) Cerebral Edema  P:   RASS goal: 0 to -1  Neurosurgery following, appreciate input    FAMILY  - Updates:  No family at bedside on AM NP rounds.  Will update on arrival.    CC Time: 30 minutes   Canary Brim, NP-C [facility] Pgr: [phone] or if no answer [phone] 03/09/2017, 9:20 AM

## 2017-03-09 NOTE — Progress Notes (Signed)
[facility] for Infectious Disease  Date of Admission:  03/05/2017     Total days of antibiotics  5  Ceftriaxone 10/26 >>  Metronidazole 10/26 >>  Vancomycin 10/26 >> 10/28               Principal Problem:   Brain abscess Active Problems:   Acute metabolic encephalopathy   Essential hypertension   Diabetes mellitus (HCC)   Elevated troponin   Multiple brain abscesses   . aspirin EC  81 mg Oral Daily  . chlorhexidine gluconate (MEDLINE KIT)  15 mL Mouth Rinse BID  . enoxaparin (LOVENOX) injection  30 mg Subcutaneous Q24H  . feeding supplement (PRO-STAT SUGAR FREE 64)  30 mL Per Tube Daily  . insulin aspart  0-20 Units Subcutaneous Q4H  . insulin glargine  10 Units Subcutaneous Daily  . mouth rinse  15 mL Mouth Rinse 10 times per day  . mupirocin cream   Topical BID  . pantoprazole (PROTONIX) IV  40 mg Intravenous QHS  . potassium chloride  40 mEq Per Tube BID  . senna-docusate  1 tablet Oral BID    SUBJECTIVE: Interval Hx: Was taken for aspiration of brain abscess yesterday PM. Hash ad some interval improvement in his mental status. Still with fevers up to ~102 last PM responsive to tylenol/cooling blanket.    Review of Systems: Review of Systems  Unable to perform ROS: Critical illness    No Known Allergies  OBJECTIVE: Vitals:   03/09/17 1101 03/09/17 1200 03/09/17 1300 03/09/17 1400  BP:  139/88 130/69 126/73  Pulse:  94 94 93  Resp:  '19 19 17  '$ Temp:  99.5 F (37.5 C)    TempSrc:  Rectal    SpO2: 93% 94% 94% 94%  Weight:      Height:       Body mass index is 39.85 kg/m.  Physical Exam  Constitutional: He is well-developed, well-nourished, and in no distress.  HENT:  Mouth/Throat: No oral lesions. Normal dentition. No dental caries.  Eyes: Pupils are equal, round, and reactive to light. No scleral icterus.  Opens eyes to voice/noise now  Cardiovascular: Normal rate, regular rhythm and normal heart sounds.   Pulmonary/Chest: Effort  normal and breath sounds normal.  Abdominal: Soft. He exhibits no distension. There is no tenderness.  Musculoskeletal:  Moves only LUE/LLE  Lymphadenopathy:    He has no cervical adenopathy.  Neurological:  Responds to vocal stimuli with eye opening and movement. Does not track/localize or follow commands.    Skin: Skin is warm and dry. No rash noted.  Psychiatric: Mood and affect normal.    Microbiology: BCx 10/26 >> NG x 3d MRSA Nasal PCR >> negative  Sputum Cx 10/28 >> pending, polymicrobial on GS Brain Abscess Cx 10/29 > GPCs in pairs on GS  Lab Results Lab Results  Component Value Date   WBC 11.1 (H) 03/09/2017   HGB 16.7 03/09/2017   HCT 51.9 03/09/2017   MCV 91.4 03/09/2017   PLT 180 03/09/2017    Lab Results  Component Value Date   CREATININE 2.89 (H) 03/09/2017   BUN 31 (H) 03/09/2017   NA 145 03/09/2017   K 3.4 (L) 03/09/2017   CL 112 (H) 03/09/2017   CO2 24 03/09/2017    Lab Results  Component Value Date   ALT 38 03/05/2017   AST 30 03/05/2017   ALKPHOS 154 (H) 03/05/2017   BILITOT 1.0 03/05/2017  ASSESSMENT: 61 yo AA male with newly discovered left basal ganglia abscess in setting of AMS. Now S/P surgical aspiration of at least one abscess. Still with fevers which is to be expected for now.  - HIV negative 03/05/17 - Recent dental extraction   Ventilatory Failure - intubated; does not meet weaning criteria yet.   AKI  - Creatinine 1.33 --> 3.11--> 2.89  PLAN: 1. Continue Ceftriaxone + Flagyl (presumed oral source) and follow cultures for now 2. Continue to monitor kidney function and optimize hemodynamics/IVF status in hopes for recovery   Janene Madeira, MSN, NP-C [facility] for Infectious [place] Pager: [phone]  03/09/2017  2:56 PM

## 2017-03-10 ENCOUNTER — Inpatient Hospital Stay (HOSPITAL_COMMUNITY)

## 2017-03-10 ENCOUNTER — Encounter (HOSPITAL_COMMUNITY): Payer: Self-pay | Admitting: Pulmonary Disease

## 2017-03-10 DIAGNOSIS — N179 Acute kidney failure, unspecified: Secondary | ICD-10-CM

## 2017-03-10 LAB — CULTURE, BLOOD (ROUTINE X 2)
CULTURE: NO GROWTH
Culture: NO GROWTH

## 2017-03-10 LAB — POCT I-STAT 3, ART BLOOD GAS (G3+)
ACID-BASE DEFICIT: 1 mmol/L (ref 0.0–2.0)
BICARBONATE: 25.4 mmol/L (ref 20.0–28.0)
O2 Saturation: 89 %
PO2 ART: 58 mmHg — AB (ref 83.0–108.0)
TCO2: 27 mmol/L (ref 22–32)
pCO2 arterial: 45.3 mmHg (ref 32.0–48.0)
pH, Arterial: 7.355 (ref 7.350–7.450)

## 2017-03-10 LAB — CBC
HCT: 51.2 % (ref 39.0–52.0)
Hemoglobin: 16.2 g/dL (ref 13.0–17.0)
MCH: 29.3 pg (ref 26.0–34.0)
MCHC: 31.6 g/dL (ref 30.0–36.0)
MCV: 92.6 fL (ref 78.0–100.0)
PLATELETS: 198 10*3/uL (ref 150–400)
RBC: 5.53 MIL/uL (ref 4.22–5.81)
RDW: 16.3 % — AB (ref 11.5–15.5)
WBC: 11.2 10*3/uL — AB (ref 4.0–10.5)

## 2017-03-10 LAB — GLUCOSE, CAPILLARY
GLUCOSE-CAPILLARY: 220 mg/dL — AB (ref 65–99)
GLUCOSE-CAPILLARY: 256 mg/dL — AB (ref 65–99)
Glucose-Capillary: 258 mg/dL — ABNORMAL HIGH (ref 65–99)
Glucose-Capillary: 222 mg/dL — ABNORMAL HIGH (ref 65–99)
Glucose-Capillary: 240 mg/dL — ABNORMAL HIGH (ref 65–99)
Glucose-Capillary: 206 mg/dL — ABNORMAL HIGH (ref 65–99)

## 2017-03-10 LAB — BASIC METABOLIC PANEL
ANION GAP: 8 (ref 5–15)
BUN: 39 mg/dL — AB (ref 6–20)
CHLORIDE: 115 mmol/L — AB (ref 101–111)
CO2: 25 mmol/L (ref 22–32)
CREATININE: 3.05 mg/dL — AB (ref 0.61–1.24)
Calcium: 8.2 mg/dL — ABNORMAL LOW (ref 8.9–10.3)
GFR calc Af Amer: 24 mL/min — ABNORMAL LOW (ref >60)
GFR, EST NON AFRICAN AMERICAN: 21 mL/min — AB (ref >60)
Glucose, Bld: 233 mg/dL — ABNORMAL HIGH (ref 65–99)
POTASSIUM: 4 mmol/L (ref 3.5–5.1)
SODIUM: 148 mmol/L — AB (ref 135–145)

## 2017-03-10 LAB — TRIGLYCERIDES: TRIGLYCERIDES: 103 mg/dL (ref <150)

## 2017-03-10 LAB — PROCALCITONIN: PROCALCITONIN: 1.64 ng/mL

## 2017-03-10 LAB — MAGNESIUM: MAGNESIUM: 2.2 mg/dL (ref 1.7–2.4)

## 2017-03-10 MED ORDER — INSULIN GLARGINE 100 UNIT/ML ~~LOC~~ SOLN
15.0000 [IU] | Freq: Every day | SUBCUTANEOUS | Status: DC
  Administered 2017-03-10 – 2017-03-11 (×2): 15 [IU] via SUBCUTANEOUS
  Filled 2017-03-10 (×2): qty 0.15

## 2017-03-10 MED ORDER — SODIUM CHLORIDE 0.45 % IV SOLN
INTRAVENOUS | Status: DC
  Administered 2017-03-10: 12:00:00 via INTRAVENOUS

## 2017-03-10 NOTE — Progress Notes (Signed)
Patient ID: Timothy Jordan, male   DOB: [birth year], 61 y.o.   MRN: [id number] Subjective:  The patient is a bit more alert today. He is in no apparent distress.  Objective: Vital signs in last 24 hours: Temp:  [98.5 F (36.9 C)-99.5 F (37.5 C)] 99.2 F (37.3 C) (10/31 0400) Pulse Rate:  [86-108] 90 (10/31 0700) Resp:  [16-29] 17 (10/31 0700) BP: (106-186)/(65-98) 127/65 (10/31 0700) SpO2:  [92 %-97 %] 95 % (10/31 0700) Arterial Line BP: (91-182)/(53-76) 103/61 (10/30 1400) FiO2 (%):  [40 %-50 %] 40 % (10/31 0700) Weight:  [113.7 kg (250 lb 10.6 oz)] 113.7 kg (250 lb 10.6 oz) (10/31 0440)  Intake/Output from previous day: 10/30 0701 - 10/31 0700 In: 2626.5 [I.V.:1047.7; NG/GT:1278.8; IV Piggyback:300] Out: 1225 [Urine:975; Emesis/NG output:250] Intake/Output this shift: No intake/output data recorded.  Physical exam risk of coma scale of 10 intubated, [id number]. His pupils are equal. He is attentive. He localizes on the left. His right hemiplegic. His dressing is clean and dry.  The patient's brain abscess cultures have shown gram-positive cocci in pairs. We are waiting final identification and sensitivities.  Lab Results:  Recent Labs  03/09/17 0421 03/10/17 0242  WBC 11.1* 11.2*  HGB 16.7 16.2  HCT 51.9 51.2  PLT 180 198   BMET  Recent Labs  03/09/17 0421 03/10/17 0242  NA 145 148*  K 3.4* 4.0  CL 112* 115*  CO2 24 25  GLUCOSE 198* 233*  BUN 31* 39*  CREATININE 2.89* 3.05*  CALCIUM 7.8* 8.2*    Studies/Results: [place] Renal  Result Date: 03/08/2017 CLINICAL DATA:  Acute renal failure x1 day.  Patient is ventilated. EXAM: RENAL / URINARY TRACT ULTRASOUND COMPLETE COMPARISON:  None. FINDINGS: Right Kidney: Length: 13.4 cm. Echogenicity within normal limits. No mass or hydronephrosis visualized. No obstructive uropathy. Trace perinephric fluid. Left Kidney: Length: 13 cm. Echogenicity within normal limits. Hypoechoic interpolar 2.2 x 1.3 by 1.9 cm and lower pole 2.9  x 2.1 x 2.3 cm cyst likely complex with internal echoes are noted. No obstructive uropathy. Trace perinephric fluid. Bladder: Decompressed by Foley. IMPRESSION: 1. Complex left-sided renal cysts. 2. No obstructive uropathy or loss of cortical-medullary distinction to explain the patient's acute renal failure. 3. Trace perinephric fluid is seen about both kidneys. Electronically Signed   By: Tollie Ethavid  Kwon M.D.   On: 03/08/2017 22:51   Dg Chest Port 1 View  Result Date: 03/08/2017 CLINICAL DATA:  Hypoxia EXAM: PORTABLE CHEST 1 VIEW COMPARISON:  March 07, 2017 FINDINGS: Endotracheal tube tip is 4.6 cm above the carina. Nasogastric tube tip and side port are in stomach. There is bibasilar atelectasis, more on the right than the left a small right pleural effusion. There is cardiomegaly with pulmonary vascularity within normal limits. No adenopathy. No bone lesions. IMPRESSION: Tube positions as described without pneumothorax. Stable cardiomegaly. Small right pleural effusion with bibasilar atelectasis. No frank consolidation noted. Electronically Signed   By: Bretta BangWilliam  Woodruff III M.D.   On: 03/08/2017 09:16    Assessment/Plan: Postop day #2: The patient is slightly better neurologically. We are continuing antibiotics at the directions of ID. We are awaiting final cultures and sensitivities.  LOS: 5 days     Xavi Tomasik D 03/10/2017, 8:21 AM

## 2017-03-10 NOTE — Progress Notes (Signed)
PULMONARY / CRITICAL CARE MEDICINE   Name: Timothy NuttingKelvin M Jordan MRN: [id number] DOB: 07-02-1955    ADMISSION DATE:  03/05/2017 CONSULTATION DATE:  03/05/17  REFERRING MD:  Dr. Isidoro Donningai   CHIEF COMPLAINT:  Respiratory Failure  HISTORY OF PRESENT ILLNESS:   61 y.o. B M admitted 03/05/2017 after presenting with altered MS. The patient had apparently been developing progressive confusion and disorientation with ataxia and hallucinations, along with dysarthria and RUE weakness. There were no associated fevers or rigors. Upon presenting to the ED at [facility] a head CT was performed indicating two cystic masses LEFT basal ganglia with surrounding vasogenic edema, suggestive of a brain abscess, which was confirmed by MRI. He [facility]. Neurosurgery recommended conservative management with Abx, considering the risks of stereotactic aspiration. Current ABxs are Rocephin + Flagyl per ID. Of note is that the patient had some teeth extraction ~ 1 month ago and did receive prophylactic Abxs.  Intubated on 10/28 for apneic episodes and concern not protecting airway. Overnight developed T:102.2, Low UOP, and Shock. He was given 2L IVF bolus and started on phenylephrine gtt.  SUBJECTIVE:  7.2L positive for admit (put out 1.2L with 20mg  lasix yesterday).  Tmax 101.7.  RN reports no acute events.    VITAL SIGNS: BP 123/65   Pulse 92   Temp 99.2 F (37.3 C) (Rectal)   Resp 18   Ht 5\' 6"  (1.676 m)   Wt 250 lb 10.6 oz (113.7 kg)   SpO2 95%   BMI 40.46 kg/m   HEMODYNAMICS:    VENTILATOR SETTINGS: Vent Mode: PRVC FiO2 (%):  [40 %-50 %] 40 % Set Rate:  [16 bmp] 16 bmp Vt Set:  [650 mL] 650 mL PEEP:  [5 cmH20] 5 cmH20 Plateau Pressure:  [16 cmH20-20 cmH20] 20 cmH20  INTAKE / OUTPUT: I/O last 3 completed shifts: In: 4417.1 [I.V.:2838.3; NG/GT:1278.8; IV Piggyback:300] Out: 2375 [Urine:1575; Emesis/NG output:800]  PHYSICAL EXAMINATION: General: obese adult male on  vent in NAD HEENT: MM pink/moist, ETT Neuro: opens eyes to voice, no follow commands, scratches groin when asked to move left arm, pupils 23mm/R, no movement noted on R side CV: s1s2 rrr, no m/r/g PULM: even/non-labored, lungs bilaterally clear FA:OZHYGI:soft, non-tender, bsx4 active  Extremities: warm/dry, trace generalized edema  Skin: mild erythema R>L skin, RLE abrasion dressed, C/D/I  LABS:  BMET  Recent Labs Lab 03/08/17 0319  03/08/17 1553 03/09/17 0421 03/10/17 0242  NA 142  < > 149* 145 148*  K 3.7  < > 3.7 3.4* 4.0  CL 114*  --   --  112* 115*  CO2 20*  --   --  24 25  BUN 30*  --   --  31* 39*  CREATININE 3.19*  --   --  2.89* 3.05*  GLUCOSE 186*  --   --  198* 233*  < > = values in this interval not displayed.  Electrolytes  Recent Labs Lab 03/08/17 0319 03/08/17 0818 03/09/17 0421 03/10/17 0242  CALCIUM 7.9*  --  7.8* 8.2*  MG  --  2.0  --  2.2  PHOS  --  4.0  --   --     CBC  Recent Labs Lab 03/08/17 0319  03/08/17 1553 03/09/17 0421 03/10/17 0242  WBC 11.5*  --   --  11.1* 11.2*  HGB 17.5*  < > 17.7* 16.7 16.2  HCT 53.9*  < > 52.0 51.9 51.2  PLT 192  --   --  180 198  < > = values in this interval not displayed.  Coag's  Recent Labs Lab 03/05/17 0815  APTT <20*  INR 0.99    Sepsis Markers  Recent Labs Lab 03/08/17 0818 03/08/17 1131 03/09/17 0421 03/10/17 0242  LATICACIDVEN 1.5 1.4  --   --   PROCALCITON 3.36  --  1.89 1.64    ABG  Recent Labs Lab 03/08/17 0848 03/08/17 1542 03/08/17 1553  PHART 7.260* 7.281* 7.240*  PCO2ART 37.6 49.9* 50.1*  PO2ART 82.0* 60.0* 74.0*    Liver Enzymes  Recent Labs Lab 03/05/17 0620  AST 30  ALT 38  ALKPHOS 154*  BILITOT 1.0  ALBUMIN 3.1*    Cardiac Enzymes  Recent Labs Lab 03/06/17 1203 03/06/17 1645 03/06/17 2230  TROPONINI 0.46* 0.50* 0.51*    Glucose  Recent Labs Lab 03/09/17 1129 03/09/17 1552 03/09/17 1956 03/09/17 2340 03/10/17 0325 03/10/17 0806   GLUCAP 241* 188* 180* 206* 220* 258*    Imaging No results found.   STUDIES:  ECHO 10/27 >> study stopped prematurely due to pt movement, mild LVH, LVEF 55-60% CT Head 10/28 >> Stable to slight enlargement of left basal ganglia masses compatible with abscesses on MRI. Worsening surrounding edema with 5 mm of rightward midline shift Renal US 10/29 >> complex left-sided renal cysts, no obstructive uropathy, trace perinephric fluid is seen about both kidneys  CULTURES: BCx2 10/26 >> negative Sputum 10/28 >> normal flora   UA 10/26 >> negative HIV 10/26 >> negative  BCx2 10/28 >> Brain Abscess 10/29 >>   ANTIBIOTICS: Ceftriaxone 10/26 >> Metronidazole 10/26 >> Vancomycin 10/26 >> 10/27  SIGNIFICANT EVENTS: 10/28 Progressive decline in MS requiring intubation 10/29 Fever and shock overnight, requiring addition of phenylephrine gtt.  Brain abscess aspiration 10/30  Off vasopressors 10/31 No weaning, no change in neuro exam  LINES/TUBES: ETT 10/28 >>   DISCUSSION: 61 y/o M with L basal ganglia brain abscess. Per my assessment it was uncertain that the patient is able to maintain a patent airway, and therefore electively intubated. The patient was assessed by Neurology who requested a repeat head CT. Will accept in tx to CCM service for airway management in the ICU.  ASSESSMENT / PLAN:  PULMONARY A: Acute Hypoxic Respiratory Failure  P:   PRVC 8 cc/kg  Wean PEEP / FiO2 for sats > 92% Intermittent CXR  Daily SBT / WUA   CARDIOVASCULAR A:  Septic Shock - in setting of brain abscess, resolved.  Off pressors 10/29.  HTN P:  ICU monitoring  PRN lopressor for SBP > 170  RENAL A:   AKI - suspect in setting of shock/hypotension, sepsis.  Renal US negative for obstructive uropathy but showed complex L renal cyst.  Metabolic Acidosis  Hypokalemia  P:   NS @ 50 ml/hr  Trend BMP / urinary output Replace electrolytes as indicated Avoid nephrotoxic agents, ensure adequate  renal perfusion Hold further lasix 10/31   GASTROINTESTINAL A:   Obesity  Constipation  P:  NPO / OGT TF per Nutrition  Senokot-S BID x4 doses  PRN dulcolax suppository  PRN colace  PPI for SUP   HEMATOLOGIC A:   Leukocytosis  P:  Lovenox for DVT prophylaxis  Trend CBC Monitor for bleeding   INFECTIOUS A:   Brain Abscess - presumed odontogenic source  Fever - suspect secondary to above P:   ID following, appreciate in put  ABX as above Follow cultures > await brain culture Continue condom cath  Insert PICC  for to prolonged IV therapy needs  ENDOCRINE A:   DM II    P:   SSI with resistant scale  Increase lantus to 15 units QD  NEUROLOGIC A:   Acute Encephalopathy in setting of Brain Abscess  Left Basal Ganglia Lesion s/p Stereotactic Aspiration (10/29) Cerebral Edema  P:   RASS goal: 0 to -1  Neurosurgery following, appreciate assistance   FAMILY  - Updates:   No family at bedside 10/31 on NP rounding.   CC Time: 30 minutes.    Canary Brim, NP-C [facility] Pgr: [phone] or if no answer [phone] 03/10/2017, 8:22 AM  --------------------------------------------------------------- I have seen and examined the patient. Agree with APP's Assessment and plan. Please see my comments as follows.   61 y/o male with DM, s/p dental procedure, admitted with Altered mental status, Left cerebral abscesses,  s/p stereotactic aspiration, on  Rocephin and Flagyl.  Intubated 10/28 for airway protection. Remains on vent.  Noted to be weak on Right side. Low grade fever; Non oliguric. On PRVC mode; not ready for SBT yet due to mental status. O/e: Pupils equal; edematous arms, I>O, breath sounds equal, coarse, no murmur, soft abdomen, non distended; on enteral feeds.no clonus noted; tone normal; Does not follow commands; on Fentanyl  Cxr> bilateral basal atelectases; ETT 6 cm above carina; pushed in 3 cm, OGT >good position.    Meds reviewed;    Vent parameters noted; PRVC- Rate 16, Peep 5, Rate 16, Vt , no autoPeep, PIP < 30.    CBC Latest Ref Rng & Units 03/10/2017 03/09/2017 03/08/2017  WBC 4.0 - 10.5 K/uL 11.2(H) 11.1(H) -  Hemoglobin 13.0 - 17.0 g/dL 45.4 09.8 17.7(H)  Hematocrit 39.0 - 52.0 % 51.2 51.9 52.0  Platelets 150 - 400 K/uL 198 180 -   BMP Latest Ref Rng & Units 03/10/2017 03/09/2017 03/08/2017  Glucose 65 - 99 mg/dL 119(J) 478(G) -  BUN 6 - 20 mg/dL 95(A) 21(H) -  Creatinine 0.61 - 1.24 mg/dL 0.86(V) 7.84(O) -  Sodium 135 - 145 mmol/L 148(H) 145 149(H)  Potassium 3.5 - 5.1 mmol/L 4.0 3.4(L) 3.7  Chloride 101 - 111 mmol/L 115(H) 112(H) -  CO2 22 - 32 mmol/L 25 24 -  Calcium 8.9 - 10.3 mg/dL 8.2(L) 7.8(L) -     ABG    Component Value Date/Time   PHART 7.240 (L) 03/08/2017 1553   PCO2ART 50.1 (H) 03/08/2017 1553   PO2ART 74.0 (L) 03/08/2017 1553   HCO3 21.5 03/08/2017 1553   TCO2 23 03/08/2017 1553   ACIDBASEDEF 6.0 (H) 03/08/2017 1553   O2SAT 92.0 03/08/2017 1553          Specimen Description ABSCESS   Special Requests BRAIN   Gram Stain ABUNDANT WBC PRESENT,BOTH PMN AND MONONUCLEAR  MODERATE GRAM POSITIVE COCCI IN PAIRS     Neurology-> Encephalopathic due to sepsis, Brain abscess, and metabolic derangement. Titrate Fentanyl to RASS -1 to  -2  Cardiology > HD stable; Echo did not report Vegetation  Pulmonary > Continue PRVC mode; Not ready to wean off vent till neuro status improves Low VT 80ml/Kg; F/u CXR, ABG  GI /nutrition > continue tube feeds Protonix for PUD prophylaxis  Renal, fluids, electrolytes> change IVF to 1/2 NS 50ml /hr; If Na remains high, add free water. F/u electrolytes Monitor AKI- Cr-3- likely sec to sepsis, ATN, HD instability earlier in the course of illness. Son- no obstruction  Hematology> Monitor blood counts;  On Lovenox for DVT prophylaxis.  Anemia due to critical illness  Endocrinology> on Lantus & insulin sliding scale coverage for  DM  Infectious diseases>F/u c/s;  On Rocephin, Flagyl.    ID f/u noted.  Lines and devices 10/28> ETT,OGT Awaiting PICC line    CODE STATUS > Full  Disposition > ICU    Thank you for letting me participate in the care of your patient.   I Have personally spent 45 Minutes in addition to APP's time In the care of this Patient providing Critical care Services; Time includes review of chart, labs, imaging, coordinating care with other physicians and healthcare team members. Excludes time spent for Procedure and Teaching.  Note subject to typographical and grammatical errors; Any formal questions or concerns about the content, text, or information contained within the body of this dictation should be directly addressed to the physician for clarification.

## 2017-03-10 NOTE — Progress Notes (Signed)
RT note: ETT advanced 3cm per MD order. Also bite block placed due to patient continuing to bite on ETT.

## 2017-03-10 NOTE — Progress Notes (Signed)
  Mr. Timothy SchroederStokes is a 61yo M admitted for AMS found to have cerebral abscess s/p radiotactic aspiration with cx showing GPC in pairs. Currently on ceftriaxone plus metronidazole but having AKI, worsening, and ventilation dependent  -physical exam has worsening edema to upper extremities. Appears higher vent settings  - continue on broad spectrum abtx - consider bladder scan to see if possible cause of worsenig urinary output. Consider getting renal consult if he continues to worsen

## 2017-03-11 ENCOUNTER — Inpatient Hospital Stay (HOSPITAL_COMMUNITY)

## 2017-03-11 ENCOUNTER — Encounter (HOSPITAL_COMMUNITY): Payer: Self-pay | Admitting: Interventional Radiology

## 2017-03-11 DIAGNOSIS — R918 Other nonspecific abnormal finding of lung field: Secondary | ICD-10-CM

## 2017-03-11 DIAGNOSIS — J9601 Acute respiratory failure with hypoxia: Secondary | ICD-10-CM

## 2017-03-11 HISTORY — PX: IR FLUORO GUIDE CV LINE RIGHT: IMG2283

## 2017-03-11 HISTORY — PX: IR US GUIDE VASC ACCESS RIGHT: IMG2390

## 2017-03-11 LAB — BLOOD GAS, ARTERIAL
ACID-BASE EXCESS: 2.2 mmol/L — AB (ref 0.0–2.0)
Bicarbonate: 26.9 mmol/L (ref 20.0–28.0)
DRAWN BY: 34696
FIO2: 60
MECHVT: 440 mL
O2 SAT: 90.4 %
PEEP/CPAP: 5 cmH2O
PH ART: 7.375 (ref 7.350–7.450)
PO2 ART: 61.7 mmHg — AB (ref 83.0–108.0)
Patient temperature: 98.6
RATE: 22 resp/min
pCO2 arterial: 47.1 mmHg (ref 32.0–48.0)

## 2017-03-11 LAB — HEPATIC FUNCTION PANEL
ALT: 16 U/L — ABNORMAL LOW (ref 17–63)
AST: 33 U/L (ref 15–41)
Albumin: 2.1 g/dL — ABNORMAL LOW (ref 3.5–5.0)
Alkaline Phosphatase: 82 U/L (ref 38–126)
BILIRUBIN INDIRECT: 0.3 mg/dL (ref 0.3–0.9)
Bilirubin, Direct: 0.1 mg/dL (ref 0.1–0.5)
TOTAL PROTEIN: 6.2 g/dL — AB (ref 6.5–8.1)
Total Bilirubin: 0.4 mg/dL (ref 0.3–1.2)

## 2017-03-11 LAB — CBC
HEMATOCRIT: 53.9 % — AB (ref 39.0–52.0)
Hemoglobin: 17.2 g/dL — ABNORMAL HIGH (ref 13.0–17.0)
MCH: 30.2 pg (ref 26.0–34.0)
MCHC: 31.9 g/dL (ref 30.0–36.0)
MCV: 94.7 fL (ref 78.0–100.0)
Platelets: 189 10*3/uL (ref 150–400)
RBC: 5.69 MIL/uL (ref 4.22–5.81)
RDW: 16.8 % — AB (ref 11.5–15.5)
WBC: 12.3 10*3/uL — AB (ref 4.0–10.5)

## 2017-03-11 LAB — BASIC METABOLIC PANEL
Anion gap: 9 (ref 5–15)
BUN: 45 mg/dL — ABNORMAL HIGH (ref 6–20)
CHLORIDE: 119 mmol/L — AB (ref 101–111)
CO2: 24 mmol/L (ref 22–32)
CREATININE: 2.79 mg/dL — AB (ref 0.61–1.24)
Calcium: 8.6 mg/dL — ABNORMAL LOW (ref 8.9–10.3)
GFR calc non Af Amer: 23 mL/min — ABNORMAL LOW (ref >60)
GFR, EST AFRICAN AMERICAN: 27 mL/min — AB (ref >60)
GLUCOSE: 233 mg/dL — AB (ref 65–99)
Potassium: 4.2 mmol/L (ref 3.5–5.1)
Sodium: 152 mmol/L — ABNORMAL HIGH (ref 135–145)

## 2017-03-11 LAB — GLUCOSE, CAPILLARY
GLUCOSE-CAPILLARY: 253 mg/dL — AB (ref 65–99)
Glucose-Capillary: 221 mg/dL — ABNORMAL HIGH (ref 65–99)
Glucose-Capillary: 176 mg/dL — ABNORMAL HIGH (ref 65–99)
Glucose-Capillary: 211 mg/dL — ABNORMAL HIGH (ref 65–99)
Glucose-Capillary: 211 mg/dL — ABNORMAL HIGH (ref 65–99)
Glucose-Capillary: 233 mg/dL — ABNORMAL HIGH (ref 65–99)

## 2017-03-11 LAB — PHOSPHORUS: PHOSPHORUS: 3.7 mg/dL (ref 2.5–4.6)

## 2017-03-11 LAB — MAGNESIUM: Magnesium: 2.5 mg/dL — ABNORMAL HIGH (ref 1.7–2.4)

## 2017-03-11 MED ORDER — ALBUTEROL SULFATE (2.5 MG/3ML) 0.083% IN NEBU
2.5000 mg | INHALATION_SOLUTION | Freq: Four times a day (QID) | RESPIRATORY_TRACT | Status: DC
  Administered 2017-03-11 – 2017-03-13 (×7): 2.5 mg via RESPIRATORY_TRACT
  Filled 2017-03-11 (×7): qty 3

## 2017-03-11 MED ORDER — INSULIN GLARGINE 100 UNIT/ML ~~LOC~~ SOLN
20.0000 [IU] | Freq: Every day | SUBCUTANEOUS | Status: DC
  Filled 2017-03-11: qty 0.2

## 2017-03-11 MED ORDER — VECURONIUM BROMIDE 10 MG IV SOLR
10.0000 mg | Freq: Once | INTRAVENOUS | Status: AC
  Administered 2017-03-11: 10 mg via INTRAVENOUS

## 2017-03-11 MED ORDER — FREE WATER
200.0000 mL | Freq: Three times a day (TID) | Status: DC
  Administered 2017-03-11 – 2017-03-12 (×4): 200 mL

## 2017-03-11 MED ORDER — VECURONIUM BROMIDE 10 MG IV SOLR
INTRAVENOUS | Status: AC
  Administered 2017-03-11: 10 mg via INTRAVENOUS
  Filled 2017-03-11: qty 10

## 2017-03-11 MED ORDER — LORAZEPAM 2 MG/ML IJ SOLN
INTRAMUSCULAR | Status: AC
  Filled 2017-03-11: qty 1

## 2017-03-11 MED ORDER — ETOMIDATE 2 MG/ML IV SOLN
20.0000 mg | Freq: Once | INTRAVENOUS | Status: AC
  Administered 2017-03-11: 20 mg via INTRAVENOUS

## 2017-03-11 MED ORDER — INSULIN ASPART 100 UNIT/ML ~~LOC~~ SOLN
4.0000 [IU] | SUBCUTANEOUS | Status: DC
  Administered 2017-03-11 – 2017-03-12 (×4): 4 [IU] via SUBCUTANEOUS

## 2017-03-11 MED ORDER — FAMOTIDINE 40 MG/5ML PO SUSR
20.0000 mg | Freq: Two times a day (BID) | ORAL | Status: DC
  Administered 2017-03-11 (×2): 20 mg
  Filled 2017-03-11 (×3): qty 2.5

## 2017-03-11 MED ORDER — INSULIN ASPART 100 UNIT/ML ~~LOC~~ SOLN
0.0000 [IU] | SUBCUTANEOUS | Status: DC
  Administered 2017-03-11: 5 [IU] via SUBCUTANEOUS
  Administered 2017-03-12: 11 [IU] via SUBCUTANEOUS
  Administered 2017-03-12: 8 [IU] via SUBCUTANEOUS

## 2017-03-11 MED ORDER — LIDOCAINE HCL (PF) 1 % IJ SOLN
INTRAMUSCULAR | Status: DC | PRN
  Administered 2017-03-11: 2 mL

## 2017-03-11 MED ORDER — ASPIRIN 81 MG PO CHEW
81.0000 mg | CHEWABLE_TABLET | Freq: Every day | ORAL | Status: DC
  Administered 2017-03-11 – 2017-03-21 (×10): 81 mg via ORAL
  Filled 2017-03-11 (×11): qty 1

## 2017-03-11 MED ORDER — DEXTROSE-NACL 5-0.2 % IV SOLN
INTRAVENOUS | Status: DC
  Administered 2017-03-11 – 2017-03-13 (×3): via INTRAVENOUS
  Filled 2017-03-11 (×2): qty 1000

## 2017-03-11 MED ORDER — LIDOCAINE HCL 1 % IJ SOLN
INTRAMUSCULAR | Status: AC
  Filled 2017-03-11: qty 20

## 2017-03-11 NOTE — Progress Notes (Signed)
[facility]   Reason for visit: Follow up on brain abscess Day 7 total antibiotics Day 7 ceftriaxone Day 7 metronidazole  Interval History: no response, no acute events, culture with no growth to date, WBC 12.3, afebrile 2 days.  Physical Exam: Constitutional:  Vitals:   03/11/17 1000 03/11/17 1100  BP: (!) 160/75 135/73  Pulse: (!) 101 (!) 101  Resp: (!) 27 (!) 22  Temp:    SpO2: 94% 95%  ddoes not respond Eyes: anicteric  Respiratory: Normal respiratory effort; CTA B Cardiovascular: RRR GI: soft, nt, nd Neuro: eyes open but does not respond to commands  Review of Systems: Unable to be assessed due to mental status  Lab Results  Component Value Date   WBC 12.3 (H) 03/11/2017   HGB 17.2 (H) 03/11/2017   HCT 53.9 (H) 03/11/2017   MCV 94.7 03/11/2017   PLT 189 03/11/2017    Lab Results  Component Value Date   CREATININE 2.79 (H) 03/11/2017   BUN 45 (H) 03/11/2017   NA 152 (H) 03/11/2017   K 4.2 03/11/2017   CL 119 (H) 03/11/2017   CO2 24 03/11/2017    Lab Results  Component Value Date   ALT 16 (L) 03/11/2017   AST 33 03/11/2017   ALKPHOS 82 03/11/2017     Microbiology: Recent Results (from the past 240 hour(s))  Culture, blood (Routine X 2) w Reflex to ID Panel     Status: None   Collection Time: 03/05/17 11:15 AM  Result Value Ref Range Status   Specimen Description BLOOD LEFT ARM  Final   Special Requests   Final    BOTTLES DRAWN AEROBIC AND ANAEROBIC Blood Culture results may not be optimal due to an inadequate volume of blood received in culture bottles   Culture NO GROWTH 5 DAYS  Final   Report Status 03/10/2017 FINAL  Final  Culture, blood (Routine X 2) w Reflex to ID Panel     Status: None   Collection Time: 03/05/17 11:20 AM  Result Value Ref Range Status   Specimen Description BLOOD RIGHT HAND  Final   Special Requests   Final    BOTTLES DRAWN AEROBIC AND ANAEROBIC Blood Culture results may not be optimal due to  an inadequate volume of blood received in culture bottles   Culture NO GROWTH 5 DAYS  Final   Report Status 03/10/2017 FINAL  Final  MRSA PCR Screening     Status: None   Collection Time: 03/07/17 10:45 AM  Result Value Ref Range Status   MRSA by PCR NEGATIVE NEGATIVE Final    Comment:        The GeneXpert MRSA Assay (FDA approved for NASAL specimens only), is one component of a comprehensive MRSA colonization surveillance program. It is not intended to diagnose MRSA infection nor to guide or monitor treatment for MRSA infections.   Culture, respiratory (tracheal aspirate)     Status: None   Collection Time: 03/07/17 10:54 AM  Result Value Ref Range Status   Specimen Description TRACHEAL ASPIRATE  Final   Special Requests Normal  Final   Gram Stain   Final    RARE WBC PRESENT, PREDOMINANTLY PMN RARE GRAM POSITIVE COCCI IN PAIRS RARE GRAM NEGATIVE RODS    Culture Consistent with normal respiratory flora.  Final   Report Status 03/09/2017 FINAL  Final  Culture, blood (routine x 2)     Status: None (Preliminary result)   Collection Time: 03/07/17 12:52  PM  Result Value Ref Range Status   Specimen Description BLOOD RIGHT ANTECUBITAL  Final   Special Requests IN PEDIATRIC BOTTLE Blood Culture adequate volume  Final   Culture NO GROWTH 3 DAYS  Final   Report Status PENDING  Incomplete  Culture, blood (routine x 2)     Status: None (Preliminary result)   Collection Time: 03/07/17 12:58 PM  Result Value Ref Range Status   Specimen Description BLOOD BLOOD RIGHT FOREARM  Final   Special Requests IN PEDIATRIC BOTTLE Blood Culture adequate volume  Final   Culture NO GROWTH 3 DAYS  Final   Report Status PENDING  Incomplete  Surgical pcr screen     Status: None   Collection Time: 03/08/17 11:05 AM  Result Value Ref Range Status   MRSA, PCR NEGATIVE NEGATIVE Final   Staphylococcus aureus NEGATIVE NEGATIVE Final    Comment: (NOTE) The Xpert SA Assay (FDA approved for NASAL specimens  in patients 59 years of age and older), is one component of a comprehensive surveillance program. It is not intended to diagnose infection nor to guide or monitor treatment.   Aerobic/Anaerobic Culture (surgical/deep wound)     Status: None (Preliminary result)   Collection Time: 03/08/17  3:49 PM  Result Value Ref Range Status   Specimen Description ABSCESS  Final   Special Requests BRAIN  Final   Gram Stain   Final    ABUNDANT WBC PRESENT,BOTH PMN AND MONONUCLEAR MODERATE GRAM POSITIVE COCCI IN PAIRS    Culture CULTURE REINCUBATED FOR BETTER GROWTH  Final   Report Status PENDING  Incomplete    Impression/Plan:  1. Brain abscess - continue ceftriaxone and metronidazole.  Watching culture.    2.  Medication monitoring - creat a bit better, has remained off of vancomycin.  Not likely related to antibiotics.  WBC ok.    3.  Renal insufficiency - some improvement in creat.  Increased free water, avoiding nephrotoxic drugs.

## 2017-03-11 NOTE — Progress Notes (Signed)
eLink Physician-Brief Progress Note Patient Name: Timothy NuttingKelvin M Bartelt DOB: Sep 03, 1955 MRN: [id number]   Date of Service  03/11/2017  HPI/Events of Note  On lantus and Tf cov  Add ssi mod  eICU Interventions       Intervention Category Major Interventions: Hyperglycemia - active titration of insulin therapy  Nelda BucksFEINSTEIN,Eastyn Dattilo J. 03/11/2017, 11:43 PM

## 2017-03-11 NOTE — Progress Notes (Addendum)
Inpatient Diabetes Program Recommendations  AACE/ADA: New Consensus Statement on Inpatient Glycemic Control (2015)  Target Ranges:  Prepandial:   less than 140 mg/dL      Peak postprandial:   less than 180 mg/dL (1-2 hours)      Critically ill patients:  140 - 180 mg/dL   Results for Timothy Jordan, [name] (MRN [id number]) as of 03/11/2017 10:57  Ref. Range 03/09/2017 23:40 03/10/2017 03:25 03/10/2017 08:06 03/10/2017 11:38 03/10/2017 16:18 03/10/2017 19:42 03/10/2017 23:37 03/11/2017 03:47 03/11/2017 07:48  Glucose-Capillary Latest Ref Range: 65 - 99 mg/dL 409206 (H) 811220 (H) 914258 (H) 222 (H) 240 (H) 206 (H) 256 (H) 221 (H) 253 (H)    Home DM Meds: Lantus 70 units daily       Metformin 1000 mg BID  Current Insulin Orders: Lantus 15 units daily      Novolog Resistant Correction Scale/ SSI (0-20 units) Q4 hours       MD- Please consider the following in-hospital insulin adjustments:  1. Increase Lantus to 20 units daily (30% total home dose Lantus)  2. Start Novolog Tube Feed Coverage: Novolog 4 units Q4 hours (hold if tube feed held for any reason)       --Will follow patient during hospitalization--  Ambrose FinlandJeannine Johnston Arien Morine RN, MSN, CDE Diabetes Coordinator Inpatient Glycemic Control Team Team Pager: [phone] (8a-5p)

## 2017-03-11 NOTE — Progress Notes (Signed)
Patient ID: Timothy Jordan, male   DOB: [birth year], 61 y.o.   MRN: [id number] Subjective:  the patient is a bit more sleepy today. He is in no apparent distress.  Objective: Vital signs in last 24 hours: Temp:  [97.3 F (36.3 C)-100.2 F (37.9 C)] 100.2 F (37.9 C) (11/01 1141) Pulse Rate:  [84-112] 101 (11/01 1700) Resp:  [20-36] 22 (11/01 1700) BP: (126-160)/(70-83) 151/77 (11/01 1700) SpO2:  [90 %-100 %] 96 % (11/01 1700) FiO2 (%):  [60 %-100 %] 100 % (11/01 1615) Weight:  [113.2 kg (249 lb 9 oz)] 113.2 kg (249 lb 9 oz) (11/01 0453)  Intake/Output from previous day: 10/31 0701 - 11/01 0700 In: 3213.7 [I.V.:1443.7; NG/GT:1320; IV Piggyback:450] Out: 1625 [Urine:1625] Intake/Output this shift: Total I/O In: 1052.4 [I.V.:452.4; NG/GT:550; IV Piggyback:50] Out: 550 [Urine:550]  Physical exam skin coma scale 8 intubated, [id number]. His pupils were equal. He flexes or localizes on the left. His right hemiplegic.  Lab Results:  Recent Labs  03/10/17 0242 03/11/17 0803  WBC 11.2* 12.3*  HGB 16.2 17.2*  HCT 51.2 53.9*  PLT 198 189   BMET  Recent Labs  03/10/17 0242 03/11/17 0803  NA 148* 152*  K 4.0 4.2  CL 115* 119*  CO2 25 24  GLUCOSE 233* 233*  BUN 39* 45*  CREATININE 3.05* 2.79*  CALCIUM 8.2* 8.6*    Studies/Results: Ir Fluoro Guide Cv Line Right  Result Date: 03/11/2017 INDICATION: Stroke EXAM: RIGHT JUGULAR TUNNELED PICC LINE PLACEMENT WITH ULTRASOUND AND FLUOROSCOPIC GUIDANCE MEDICATIONS: None ANESTHESIA/SEDATION: None FLUOROSCOPY TIME:  Fluoroscopy Time:  minutes 12 seconds (2 mGy). COMPLICATIONS: None immediate. PROCEDURE: The patient was advised of the possible risks and complications and agreed to undergo the procedure. The patient was then brought to the angiographic suite for the procedure. The right neck was prepped with chlorhexidine, draped in the usual sterile fashion using maximum barrier technique (cap and mask, sterile gown, sterile gloves, large  sterile sheet, hand hygiene and cutaneous antiseptic). Local anesthesia was attained by infiltration with 1% lidocaine. Ultrasound demonstrated patency of the right jugular vein, and this was documented with an image. Under real-time ultrasound guidance, this vein was accessed with a 21 gauge micropuncture needle and image documentation was performed. The needle was exchanged over a guidewire for a peel-away sheath through which a 24 cm 5 [place] double lumen power injectable PICC was advanced, and positioned with its tip at the lower SVC/right atrial junction. The cuff was positioned in the subcutaneous tract. Fluoroscopy during the procedure and fluoro spot radiograph confirms appropriate catheter position. The catheter was flushed, secured to the skin with Prolene sutures, and covered with a sterile dressing. IMPRESSION: Successful placement of a tunneled right jugular PICC with sonographic and fluoroscopic guidance. The catheter is ready for use. Electronically Signed   By: Jolaine ClickArthur  Hoss M.D.   On: 03/11/2017 15:11   Ir Koreas Guide Vasc Access Right  Result Date: 03/11/2017 INDICATION: Stroke EXAM: RIGHT JUGULAR TUNNELED PICC LINE PLACEMENT WITH ULTRASOUND AND FLUOROSCOPIC GUIDANCE MEDICATIONS: None ANESTHESIA/SEDATION: None FLUOROSCOPY TIME:  Fluoroscopy Time:  minutes 12 seconds (2 mGy). COMPLICATIONS: None immediate. PROCEDURE: The patient was advised of the possible risks and complications and agreed to undergo the procedure. The patient was then brought to the angiographic suite for the procedure. The right neck was prepped with chlorhexidine, draped in the usual sterile fashion using maximum barrier technique (cap and mask, sterile gown, sterile gloves, large sterile sheet, hand hygiene and cutaneous antiseptic). Local anesthesia was  attained by infiltration with 1% lidocaine. Ultrasound demonstrated patency of the right jugular vein, and this was documented with an image. Under real-time ultrasound  guidance, this vein was accessed with a 21 gauge micropuncture needle and image documentation was performed. The needle was exchanged over a guidewire for a peel-away sheath through which a 24 cm 5 [place] double lumen power injectable PICC was advanced, and positioned with its tip at the lower SVC/right atrial junction. The cuff was positioned in the subcutaneous tract. Fluoroscopy during the procedure and fluoro spot radiograph confirms appropriate catheter position. The catheter was flushed, secured to the skin with Prolene sutures, and covered with a sterile dressing. IMPRESSION: Successful placement of a tunneled right jugular PICC with sonographic and fluoroscopic guidance. The catheter is ready for use. Electronically Signed   By: Jolaine Click M.D.   On: 03/11/2017 15:11   Dg Chest Port 1 View  Result Date: 03/11/2017 CLINICAL DATA:  Check endotracheal tube placement EXAM: PORTABLE CHEST 1 VIEW COMPARISON:  03/10/2017 FINDINGS: Endotracheal tube and nasogastric catheter are again noted and stable. Cardiac shadow is enlarged but stable. Bibasilar infiltrates with associated effusions are again seen and mildly increased when compare with the prior exam. No bony abnormality is noted. IMPRESSION: Tubes and lines as described. Increasing bibasilar changes right greater than left. Electronically Signed   By: Alcide Clever M.D.   On: 03/11/2017 09:02   Dg Chest Port 1 View  Result Date: 03/10/2017 CLINICAL DATA:  Acute respiratory failure, hypoxia EXAM: PORTABLE CHEST 1 VIEW COMPARISON:  03/08/2017 FINDINGS: Endotracheal tube and NG tube are unchanged. Cardiomegaly with vascular congestion. Worsening aeration and lung volumes with increasing bibasilar opacities. Question small layering effusions. IMPRESSION: Worsening aeration with decreasing lung volumes and increasing bibasilar atelectasis or infiltrates. Question small effusions. Electronically Signed   By: Charlett Nose M.D.   On: 03/10/2017 08:27     Assessment/Plan: Brain abscess: We are awaiting the final cultures. He is on empiric antibiotics.  LOS: 6 days     Doyle Kunath D 03/11/2017, 5:53 PM

## 2017-03-11 NOTE — Progress Notes (Addendum)
PULMONARY / CRITICAL CARE MEDICINE   Name: Timothy Jordan MRN:   [id number] DOB:   1956/03/28           ADMISSION DATE:  03/05/2017 CONSULTATION DATE:  03/05/17  REFERRING MD:  Dr. Isidoro Donning  CHIEF COMPLAINT:  Respiratory Failure   HISTORY OF PRESENT ILLNESS:   61 y.o. B M admitted 03/05/2017 after presenting with altered MS. The patient had apparently been developing progressive confusion and disorientation with ataxia and hallucinations, along with dysarthria and RUE weakness. There were no associated fevers or rigors. Upon presenting to the ED at [facility] a head CT was performed indicating two cystic masses LEFT basal ganglia with surrounding vasogenic edema, suggestive of a brain abscess, which was confirmed by MRI. He was then Tx to [facility] for further management. Neurosurgery recommended conservative management with Abx, considering the risks of stereotactic aspiration. Current ABxs are Rocephin + Flagyl per ID. Of note is that the patient had some teeth extraction ~ 1 month ago and did receive prophylactic Abxs.  Intubated on 10/28 for apneic episodes and concern not protecting airway.   SUBJECTIVE:   Non oliguric, No fever.  Minimal ET secretions. Fi02 requirement increased to 70%  Meds list reviewed  VENTILATOR SETTINGS: Vent Mode: PRVC FiO2 (%):  [40 %-50 %] 70 % Set Rate:  [16 bmp] 16 bmp Vt Set:  [650 mL]     PEEP:  [5 cmH20] 5 cmH20 Plateau Pressure:  [16 cmH20-20 cmH20] 25 cmH20   I/O last 3 completed shifts: In: 4643.1 [I.V.:2163.1; NG/GT:1980; IV Piggyback:500] Out: 2200 [Urine:2200] Total I/O In: 233.2 [I.V.:123.2; NG/GT:110] Out: 150 [Urine:150]   PHYSICAL EXAMINATION: General: obese adult male on vent in NAD HEENT: no HVD, ETT in place Neuro: opens eyes to voice, minimally  follows commands,  Equal  pupils , no movement noted on R side CV: s1s2 rrr, no m/r/g PULM: even/non-labored, lungs bilaterally coarse; diminished in bases WU:JWJX,  non-tender, BS+ hypoactive  Extremities: warm/dry,   generalized edema  Skin:  no erythema, RLE abrasion dressed, C/D/I   BMP Latest Ref Rng & Units 03/11/2017 03/10/2017 03/09/2017  Glucose 65 - 99 mg/dL 914(N) 829(F) 621(H)  BUN 6 - 20 mg/dL 08(M) 57(Q) 46(N)  Creatinine 0.61 - 1.24 mg/dL 6.29(B) 2.84(X) 3.24(M)  Sodium 135 - 145 mmol/L 152(H) 148(H) 145  Potassium 3.5 - 5.1 mmol/L 4.2 4.0 3.4(L)  Chloride 101 - 111 mmol/L 119(H) 115(H) 112(H)  CO2 22 - 32 mmol/L 24 25 24   Calcium 8.9 - 10.3 mg/dL 0.1(U) 2.7(O) 7.8(L)    CBC Latest Ref Rng & Units 03/11/2017 03/10/2017 03/09/2017  WBC 4.0 - 10.5 K/uL 12.3(H) 11.2(H) 11.1(H)  Hemoglobin 13.0 - 17.0 g/dL 17.2(H) 16.2 16.7  Hematocrit 39.0 - 52.0 % 53.9(H) 51.2 51.9  Platelets 150 - 400 K/uL 189 198 180    arterial blood gases Results for Timothy, Jordan (MRN [id number]) as of 03/11/2017 09:49  Ref. Range 03/11/2017 04:20  pH, Arterial Latest Ref Range: 7.350 - 7.450  7.375  pCO2 arterial Latest Ref Range: 32.0 - 48.0 mmHg 47.1  pO2, Arterial Latest Ref Range: 83.0 - 108.0 mmHg 61.7 (L)  Acid-Base Excess Latest Ref Range: 0.0 - 2.0 mmol/L 2.2 (H)  Bicarbonate Latest Ref Range: 20.0 - 28.0 mmol/L 26.9    STUDIES:  ECHO 10/27 >> study stopped prematurely due to pt movement, mild LVH, LVEF 55-60%  CT Head 10/28 >> Stable to slight enlargement of left basal ganglia masses compatible with abscesses on  MRI. Worsening surrounding edema with 5 mm of rightward midline shift  Renal US 10/29 >> complex left-sided renal cysts, no obstructive uropathy, trace perinephric fluid is seen about both kidneys  CULTURES:    Results for orders placed or performed during the hospital encounter of 03/05/17  Culture, blood (Routine X 2) w Reflex to ID Panel     Status: None   Collection Time: 03/05/17 11:15 AM  Result Value Ref Range Status   Specimen Description BLOOD LEFT ARM  Final   Special Requests   Final    BOTTLES DRAWN AEROBIC AND  ANAEROBIC Blood Culture results may not be optimal due to an inadequate volume of blood received in culture bottles   Culture NO GROWTH 5 DAYS  Final   Report Status 03/10/2017 FINAL  Final  Culture, blood (Routine X 2) w Reflex to ID Panel     Status: None   Collection Time: 03/05/17 11:20 AM  Result Value Ref Range Status   Specimen Description BLOOD RIGHT HAND  Final   Special Requests   Final    BOTTLES DRAWN AEROBIC AND ANAEROBIC Blood Culture results may not be optimal due to an inadequate volume of blood received in culture bottles   Culture NO GROWTH 5 DAYS  Final   Report Status 03/10/2017 FINAL  Final  MRSA PCR Screening     Status: None   Collection Time: 03/07/17 10:45 AM  Result Value Ref Range Status   MRSA by PCR NEGATIVE NEGATIVE Final    Comment:        The GeneXpert MRSA Assay (FDA approved for NASAL specimens only), is one component of a comprehensive MRSA colonization surveillance program. It is not intended to diagnose MRSA infection nor to guide or monitor treatment for MRSA infections.   Culture, respiratory (tracheal aspirate)     Status: None   Collection Time: 03/07/17 10:54 AM  Result Value Ref Range Status   Specimen Description TRACHEAL ASPIRATE  Final   Special Requests Normal  Final   Gram Stain   Final    RARE WBC PRESENT, PREDOMINANTLY PMN RARE GRAM POSITIVE COCCI IN PAIRS RARE GRAM NEGATIVE RODS    Culture Consistent with normal respiratory flora.  Final   Report Status 03/09/2017 FINAL  Final  Culture, blood (routine x 2)     Status: None (Preliminary result)   Collection Time: 03/07/17 12:52 PM  Result Value Ref Range Status   Specimen Description BLOOD RIGHT ANTECUBITAL  Final   Special Requests IN PEDIATRIC BOTTLE Blood Culture adequate volume  Final   Culture NO GROWTH 3 DAYS  Final   Report Status PENDING  Incomplete  Culture, blood (routine x 2)     Status: None (Preliminary result)   Collection Time: 03/07/17 12:58 PM  Result  Value Ref Range Status   Specimen Description BLOOD BLOOD RIGHT FOREARM  Final   Special Requests IN PEDIATRIC BOTTLE Blood Culture adequate volume  Final   Culture NO GROWTH 3 DAYS  Final   Report Status PENDING  Incomplete  Surgical pcr screen     Status: None   Collection Time: 03/08/17 11:05 AM  Result Value Ref Range Status   MRSA, PCR NEGATIVE NEGATIVE Final   Staphylococcus aureus NEGATIVE NEGATIVE Final    Comment: (NOTE) The Xpert SA Assay (FDA approved for NASAL specimens in patients 64 years of age and older), is one component of a comprehensive surveillance program. It is not intended to diagnose infection nor to guide or  monitor treatment.   Aerobic/Anaerobic Culture (surgical/deep wound)     Status: None (Preliminary result)   Collection Time: 03/08/17  3:49 PM  Result Value Ref Range Status   Specimen Description ABSCESS  Final   Special Requests BRAIN  Final   Gram Stain   Final    ABUNDANT WBC PRESENT,BOTH PMN AND MONONUCLEAR MODERATE GRAM POSITIVE COCCI IN PAIRS    Culture CULTURE REINCUBATED FOR BETTER GROWTH  Final   Report Status PENDING  Incomplete     CXR-03/11/17  R> L consolidation/atelectases/Pleural effusion, ETT, OGT- good position  Left US performed along with right chest with no appreciable effusion amenable to thoracentesis.  ANTIBIOTICS: Ceftriaxone 10/26 >> Metronidazole 10/26 >> Vancomycin 10/26 >> 10/27  SIGNIFICANT EVENTS: 10/28 Progressive decline in MS requiring intubation 10/29 Fever and shock overnight, requiring addition of phenylephrine gtt.  Brain abscess aspiration 10/30  Off vasopressors 10/31 No weaning, no change in neuro exam 11/1- worsening cxr and FI02 need  LINES/TUBES: ETT, OGT 10/28     DISCUSSION:  61 y/o male with DM, s/p dental procedure, admitted with Altered mental status, Left basal ganglia brain abscess. Left cerebral abscesses,  s/p stereotactic aspiration, on  Rocephin and Flagyl.  ASSESSMENT /  PLAN:  PULMONARY  Acute Hypoxic Respiratory Failure     PRVC  VT 136ml/kg  Wean PEEP / FiO2 for sats > 92% Daily  CXR  Sono chest to look for effusion/atelectasis/consolidation Pulm toileting measures added Likely needs Thoracentesis /Bronchoscopy depending on further imaging info.   CARDIOVASCULAR  Septic Shock - in setting of brain abscess, resolved.  Off pressors 10/29.  HTN  ICU monitoring  PRN lopressor for SBP > 170  RENAL AKI - suspect in setting of shock/hypotension, sepsis.  Renal US negative for obstructive uropathy but showed complex L renal cyst.  Metabolic Acidosis   AKI improving. Hypernatremia-Adding free water, changing to hypotonic fluids.d5.Marland Kitchen.Marland Kitchen.1/4NS  @ 50 ml/hr  Trend BMP / urinary output Replace electrolytes as indicated Avoid nephrotoxic agents, ensure adequate renal perfusion Hold further lasix    GASTROINTESTINAL  TF per Nutrition  Senokot-S BID x4 doses  PRN dulcolax suppository  PRN colace  Pepcid instead of PPI  for SUP   HEMATOLOGIC Lovenox for DVT prophylaxis  Trend CBC Monitor for bleeding   INFECTIOUS Brain Abscess - presumed odontogenic source  ID following, appreciate input  ABX-Rocephin,Flgyl Follow cultures > await brain culture Continue condom cath  Insert PICC for prolonged IV therapy needs Rpt sputum c/s  ENDOCRINE   DM II    SSI with resistant scale   lantus  15 units QD  NEUROLOGIC  Acute Encephalopathy in setting of Brain Abscess  Left Basal Ganglia Lesion s/p Stereotactic Aspiration (10/29) Cerebral Edema  RASS goal: 0 to -1 on Fentanyl Neurosurgery following, appreciate assistance   FAMILY  - Updates:   No family at bedside 11/1 on  rounding.       Thank you for letting me participate in the care of your patient. The patient is critically ill with multiple organ systems failure and requires high complexity decision making for assessment and support, frequent evaluation and titration of therapies,  application of advanced monitoring technologies and extensive interpretation of multiple databases like review of chart, labs, imaging, coordinating care with other physicians and healthcare team members.. ? Critical Care Time devoted to patient care services described in this note is 45 Minutes.?This time reflects time of care of this signee. This critical care time does not reflect procedure time,  or teaching time or supervisory time of NP, but could involve care discussion time. Note subject to typographical and grammatical errors; Any formal questions or concerns about the content, text, or information contained within the body of this dictation should be directly addressed to the physician for clarification.     Caryl Comes, MD  Pulmonary and Critical Care Medicine  [facility] Pager: [phone]

## 2017-03-11 NOTE — Procedures (Signed)
Bedside Bronchoscopy Procedure Note [name] [id number] [birth year]  Procedure: Bronchoscopy Indications: Diagnostic evaluation of the airways and Obtain specimens for culture and/or other diagnostic studies  Procedure Details: ET Tube Size: 7.5 ET Tube secured at lip (cm): 25 at lip Bite block in place: No In preparation for procedure, Patient hyper-oxygenated with 100 % FiO2 Airway entered and the following bronchi were examined: Bronchi.   Bronchoscope removed.  , Patient placed back on 100% FiO2 at conclusion of procedure.    Evaluation BP (!) 159/77   Pulse 100   Temp 100.2 F (37.9 C) (Core (Comment))   Resp (!) 26   Ht 5\' 10"  (1.778 m) Comment: per family  Wt 249 lb 9 oz (113.2 kg)   SpO2 90%   BMI 35.81 kg/m  Breath Sounds:Clear and Diminished O2 sats: stable throughout Patient's Current Condition: stable Specimens:  Sent purulent fluid Complications: No apparent complications Patient did tolerate procedure well. Pt required sedation/paralytics for bronch d/t agitation- per MD  Jennette KettleBrowning, Haziel Molner Joy 03/11/2017, 4:37 PM

## 2017-03-11 NOTE — Progress Notes (Signed)
Attempted to collect sputum for sputum culture.  Currently not enough sample to collect, RN in room and aware.

## 2017-03-11 NOTE — Procedures (Signed)
RIJV tunneled PICC SVC RA 24 cm EBL 0 Comp 0

## 2017-03-11 NOTE — Procedures (Signed)
Bronchoscopy Procedure Note LINDEN TAGLIAFERRO [id number] 02/06/1956  Procedure: Bronchoscopy Indications: Diagnostic evaluation of the airways, Obtain specimens for culture and/or other diagnostic studies and Remove secretions  Procedure Details Consent: Risks of procedure as well as the alternatives and risks of each were explained to the (patient/caregiver).  Consent for procedure obtained. Time Out: Verified patient identification, verified procedure, site/side was marked, verified correct patient position, special equipment/implants available, medications/allergies/relevent history reviewed, required imaging and test results available.  Performed  In preparation for procedure, patient was given 100% FiO2 and bronchoscope lubricated. Sedation: Etomidate and Fentanyl  Airway entered and the following bronchi were examined: RUL, RML, RLL, LUL, LLL and Bronchi.   Airway is completely clean BAL from RLL Bronchoscope removed.  , Patient placed back on 100% FiO2 at conclusion of procedure.    Evaluation Hemodynamic Status: BP stable throughout; O2 sats: stable throughout Patient's Current Condition: stable Specimens:  Sent purulent fluid Complications: No apparent complications Patient did tolerate procedure well.   YACOUB,WESAM 03/11/2017

## 2017-03-12 ENCOUNTER — Inpatient Hospital Stay (HOSPITAL_COMMUNITY)

## 2017-03-12 LAB — CBC WITH DIFFERENTIAL/PLATELET
BASOS PCT: 0 %
Basophils Absolute: 0 10*3/uL (ref 0.0–0.1)
EOS PCT: 3 %
Eosinophils Absolute: 0.4 10*3/uL (ref 0.0–0.7)
HEMATOCRIT: 47.5 % (ref 39.0–52.0)
HEMOGLOBIN: 14.8 g/dL (ref 13.0–17.0)
LYMPHS PCT: 14 %
Lymphs Abs: 2 10*3/uL (ref 0.7–4.0)
MCH: 29.8 pg (ref 26.0–34.0)
MCHC: 31.2 g/dL (ref 30.0–36.0)
MCV: 95.8 fL (ref 78.0–100.0)
MONOS PCT: 5 %
Monocytes Absolute: 0.7 10*3/uL (ref 0.1–1.0)
Neutro Abs: 10.9 10*3/uL — ABNORMAL HIGH (ref 1.7–7.7)
Neutrophils Relative %: 78 %
Platelets: 178 10*3/uL (ref 150–400)
RBC: 4.96 MIL/uL (ref 4.22–5.81)
RDW: 16.7 % — ABNORMAL HIGH (ref 11.5–15.5)
WBC: 14 10*3/uL — ABNORMAL HIGH (ref 4.0–10.5)

## 2017-03-12 LAB — GLUCOSE, CAPILLARY
GLUCOSE-CAPILLARY: 189 mg/dL — AB (ref 65–99)
GLUCOSE-CAPILLARY: 222 mg/dL — AB (ref 65–99)
GLUCOSE-CAPILLARY: 241 mg/dL — AB (ref 65–99)
GLUCOSE-CAPILLARY: 249 mg/dL — AB (ref 65–99)
GLUCOSE-CAPILLARY: 255 mg/dL — AB (ref 65–99)
GLUCOSE-CAPILLARY: 332 mg/dL — AB (ref 65–99)

## 2017-03-12 LAB — POCT I-STAT 3, ART BLOOD GAS (G3+)
Acid-Base Excess: 2 mmol/L (ref 0.0–2.0)
Bicarbonate: 28.2 mmol/L — ABNORMAL HIGH (ref 20.0–28.0)
O2 Saturation: 90 %
PCO2 ART: 54.8 mmHg — AB (ref 32.0–48.0)
Patient temperature: 101.6
TCO2: 30 mmol/L (ref 22–32)
pH, Arterial: 7.327 — ABNORMAL LOW (ref 7.350–7.450)
pO2, Arterial: 70 mmHg — ABNORMAL LOW (ref 83.0–108.0)

## 2017-03-12 LAB — BASIC METABOLIC PANEL
Anion gap: 5 (ref 5–15)
BUN: 45 mg/dL — AB (ref 6–20)
CHLORIDE: 118 mmol/L — AB (ref 101–111)
CO2: 28 mmol/L (ref 22–32)
CREATININE: 2.69 mg/dL — AB (ref 0.61–1.24)
Calcium: 8.4 mg/dL — ABNORMAL LOW (ref 8.9–10.3)
GFR calc non Af Amer: 24 mL/min — ABNORMAL LOW (ref >60)
GFR, EST AFRICAN AMERICAN: 28 mL/min — AB (ref >60)
Glucose, Bld: 339 mg/dL — ABNORMAL HIGH (ref 65–99)
Potassium: 4.2 mmol/L (ref 3.5–5.1)
Sodium: 151 mmol/L — ABNORMAL HIGH (ref 135–145)

## 2017-03-12 LAB — CULTURE, BLOOD (ROUTINE X 2)
CULTURE: NO GROWTH
Culture: NO GROWTH
SPECIAL REQUESTS: ADEQUATE
Special Requests: ADEQUATE

## 2017-03-12 LAB — HEMOGLOBIN A1C
Hgb A1c MFr Bld: 9.1 % — ABNORMAL HIGH (ref 4.8–5.6)
MEAN PLASMA GLUCOSE: 214.47 mg/dL

## 2017-03-12 MED ORDER — FREE WATER
300.0000 mL | Freq: Three times a day (TID) | Status: DC
  Administered 2017-03-12 – 2017-03-13 (×3): 300 mL

## 2017-03-12 MED ORDER — SODIUM CHLORIDE 0.9% FLUSH
10.0000 mL | INTRAVENOUS | Status: DC | PRN
Start: 2017-03-12 — End: 2017-03-25

## 2017-03-12 MED ORDER — INSULIN ASPART 100 UNIT/ML ~~LOC~~ SOLN
10.0000 [IU] | Freq: Once | SUBCUTANEOUS | Status: AC
  Administered 2017-03-12: 10 [IU] via INTRAVENOUS

## 2017-03-12 MED ORDER — INSULIN ASPART 100 UNIT/ML ~~LOC~~ SOLN
4.0000 [IU] | SUBCUTANEOUS | Status: DC
  Administered 2017-03-12 – 2017-03-17 (×31): 4 [IU] via SUBCUTANEOUS

## 2017-03-12 MED ORDER — INSULIN ASPART 100 UNIT/ML ~~LOC~~ SOLN
0.0000 [IU] | SUBCUTANEOUS | Status: DC
  Administered 2017-03-12: 8 [IU] via SUBCUTANEOUS
  Administered 2017-03-12: 7 [IU] via SUBCUTANEOUS
  Administered 2017-03-12: 11 [IU] via SUBCUTANEOUS
  Administered 2017-03-13: 4 [IU] via SUBCUTANEOUS
  Administered 2017-03-13: 11 [IU] via SUBCUTANEOUS
  Administered 2017-03-13 – 2017-03-14 (×5): 7 [IU] via SUBCUTANEOUS
  Administered 2017-03-14: 4 [IU] via SUBCUTANEOUS
  Administered 2017-03-14 (×2): 7 [IU] via SUBCUTANEOUS
  Administered 2017-03-14 (×2): 4 [IU] via SUBCUTANEOUS
  Administered 2017-03-15: 3 [IU] via SUBCUTANEOUS
  Administered 2017-03-15: 4 [IU] via SUBCUTANEOUS
  Administered 2017-03-15: 7 [IU] via SUBCUTANEOUS
  Administered 2017-03-15: 3 [IU] via SUBCUTANEOUS
  Administered 2017-03-15 (×2): 4 [IU] via SUBCUTANEOUS
  Administered 2017-03-16 (×2): 11 [IU] via SUBCUTANEOUS
  Administered 2017-03-16: 20 [IU] via SUBCUTANEOUS
  Administered 2017-03-16 (×2): 11 [IU] via SUBCUTANEOUS
  Administered 2017-03-16 – 2017-03-17 (×3): 15 [IU] via SUBCUTANEOUS
  Administered 2017-03-17: 20 [IU] via SUBCUTANEOUS
  Administered 2017-03-17: 11 [IU] via SUBCUTANEOUS

## 2017-03-12 MED ORDER — SENNOSIDES 8.8 MG/5ML PO SYRP
5.0000 mL | ORAL_SOLUTION | Freq: Every day | ORAL | Status: DC
  Administered 2017-03-12 – 2017-03-24 (×12): 5 mL
  Filled 2017-03-12 (×12): qty 5

## 2017-03-12 MED ORDER — INSULIN GLARGINE 100 UNIT/ML ~~LOC~~ SOLN
22.0000 [IU] | Freq: Every day | SUBCUTANEOUS | Status: DC
  Administered 2017-03-12 – 2017-03-17 (×5): 22 [IU] via SUBCUTANEOUS
  Filled 2017-03-12 (×6): qty 0.22

## 2017-03-12 MED ORDER — SODIUM CHLORIDE 0.9% FLUSH
10.0000 mL | Freq: Two times a day (BID) | INTRAVENOUS | Status: DC
  Administered 2017-03-12 – 2017-03-13 (×3): 10 mL
  Administered 2017-03-14: 20 mL
  Administered 2017-03-15 – 2017-03-22 (×10): 10 mL
  Administered 2017-03-23: 20 mL
  Administered 2017-03-23 – 2017-03-24 (×2): 10 mL

## 2017-03-12 MED ORDER — CHLORHEXIDINE GLUCONATE CLOTH 2 % EX PADS
6.0000 | MEDICATED_PAD | Freq: Every day | CUTANEOUS | Status: DC
  Administered 2017-03-13 – 2017-03-24 (×12): 6 via TOPICAL

## 2017-03-12 MED ORDER — FAMOTIDINE 40 MG/5ML PO SUSR
20.0000 mg | Freq: Every day | ORAL | Status: DC
  Administered 2017-03-12 – 2017-03-18 (×6): 20 mg
  Filled 2017-03-12 (×8): qty 2.5

## 2017-03-12 MED ORDER — ENOXAPARIN SODIUM 40 MG/0.4ML ~~LOC~~ SOLN
40.0000 mg | SUBCUTANEOUS | Status: DC
  Administered 2017-03-12 – 2017-03-24 (×12): 40 mg via SUBCUTANEOUS
  Filled 2017-03-12 (×12): qty 0.4

## 2017-03-12 NOTE — Progress Notes (Signed)
eLink Physician-Brief Progress Note Patient Name: Timothy NuttingKelvin M Seever DOB: 02/27/1956 MRN: [id number]   Date of Service  03/12/2017  HPI/Events of Note  Glu 332 lantus has not been given yet He is on d5 solution for na  Provide insuin IV 10 , repeat glu in 1 hour Likely then safe for lantus and may need escalation TF coverage amount  eICU Interventions       Intervention Category Intermediate Interventions: Hyperglycemia - evaluation and treatment  FEINSTEIN,DANIEL J. 03/12/2017, 6:02 AM

## 2017-03-12 NOTE — Progress Notes (Signed)
eLink Physician-Brief Progress Note Patient Name: Timothy Jordan DOB: 1956-01-11 MRN: [id number]   Date of Service  03/12/2017  HPI/Events of Note  On 100% pao2 60's Peep to 10  eICU Interventions       Intervention Category Major Interventions: Acid-Base disturbance - evaluation and management  Nelda BucksFEINSTEIN,Ava Tangney J. 03/12/2017, 3:54 AM

## 2017-03-12 NOTE — Progress Notes (Signed)
PULMONARY / CRITICAL CARE MEDICINE   Name: Timothy Jordan MRN: [id number] DOB: 06/09/1955    ADMISSION DATE:  03/05/2017 CONSULTATION DATE:  03/05/17  REFERRING MD:  Dr. Tana Coast   CHIEF COMPLAINT:  Respiratory Failure  HISTORY OF PRESENT ILLNESS:   61 y.o. B M admitted 03/05/2017 after presenting with altered MS. The patient had apparently been developing progressive confusion and disorientation with ataxia and hallucinations, along with dysarthria and RUE weakness. There were no associated fevers or rigors. Upon presenting to the ED at [facility] a head CT was performed indicating two cystic masses LEFT basal ganglia with surrounding vasogenic edema, suggestive of a brain abscess, which was confirmed by MRI. He was then Tx to [facility] for further management. Neurosurgery recommended conservative management with Abx, considering the risks of stereotactic aspiration. Current ABxs are Rocephin + Flagyl per ID. Of note is that the patient had some teeth extraction ~ 1 month ago and did receive prophylactic Abxs.  Intubated on 10/28 for apneic episodes and concern not protecting airway. Overnight developed T:102.2, Low UOP, and Shock. He was given 2L IVF bolus and started on phenylephrine gtt.  SUBJECTIVE:   Increased O2 needs overnight (PEEP 10, FiO2 100%), increased glucose with SSI change from [facility].  Tmax 100.9 / WBC 14.    VITAL SIGNS: BP 125/63   Pulse 86   Temp 99 F (37.2 C) (Axillary)   Resp 18   Ht _0  (1.778 m) Comment: per family  Wt 250 lb 14.1 oz (113.8 kg)   SpO2 98%   BMI 36.00 kg/m   HEMODYNAMICS:    VENTILATOR SETTINGS: Vent Mode: PRVC FiO2 (%):  [60 %-100 %] 80 % Set Rate:  [22 bmp] 22 bmp Vt Set:  [440 mL] 440 mL PEEP:  [5 cmH20-10 cmH20] 10 cmH20 Plateau Pressure:  [15 cmH20-20 cmH20] 18 cmH20  INTAKE / OUTPUT: I/O last 3 completed shifts: In: 4559 [I.V.:2029; NG/GT:1980; IV Piggyback:550] Out: 2950 [Urine:2950]  PHYSICAL EXAMINATION: General: adult  male in NAD on vent  HEENT: MM pink/moist, ETT, pupils 26m reactive.  Left scalp staples c/d/i Neuro: opens eyes to voice, ? Squeeze on RUE to command, no movement noted on L CV: s1s2 rrr, no m/r/g PULM: even/non-labored, lungs bilaterally coarse  GTA:VWPV non-tender, bsx4 active  Extremities: warm/dry, generalized edema  Skin: no rashes or lesions  LABS:  BMET  Recent Labs Lab 03/10/17 0242 03/11/17 0803 03/12/17 0458  NA 148* 152* 151*  K 4.0 4.2 4.2  CL 115* 119* 118*  CO2 _1 BUN 39* 45* 45*  CREATININE 3.05* 2.79* 2.69*  GLUCOSE 233* 233* 339*    Electrolytes  Recent Labs Lab 03/08/17 0818  03/10/17 0242 03/11/17 0803 03/12/17 0458  CALCIUM  --   < > 8.2* 8.6* 8.4*  MG 2.0  --  2.2 2.5*  --   PHOS 4.0  --   --  3.7  --   < > = values in this interval not displayed.  CBC  Recent Labs Lab 03/10/17 0242 03/11/17 0803 03/12/17 0458  WBC 11.2* 12.3* 14.0*  HGB 16.2 17.2* 14.8  HCT 51.2 53.9* 47.5  PLT 198 189 178    Coag's No results for input(s): APTT, INR in the last 168 hours.  Sepsis Markers  Recent Labs Lab 03/08/17 0818 03/08/17 1131 03/09/17 0421 03/10/17 0242  LATICACIDVEN 1.5 1.4  --   --   PROCALCITON 3.36  --  1.89 1.64    ABG  Recent Labs Lab 03/10/17 1437 03/11/17 0420 03/12/17 0321  PHART 7.355 7.375 7.327*  PCO2ART 45.3 47.1 54.8*  PO2ART 58.0* 61.7* 70.0*    Liver Enzymes  Recent Labs Lab 03/11/17 0803  AST 33  ALT 16*  ALKPHOS 82  BILITOT 0.4  ALBUMIN 2.1*    Cardiac Enzymes  Recent Labs Lab 03/06/17 1203 03/06/17 1645 03/06/17 2230  TROPONINI 0.46* 0.50* 0.51*    Glucose  Recent Labs Lab 03/11/17 1140 03/11/17 1633 03/11/17 1936 03/11/17 2327 03/12/17 0337 03/12/17 0727  GLUCAP 176* 211* 211* 233* 332* 249*    Imaging Ir Fluoro Guide Cv Line Right  Result Date: 03/11/2017 INDICATION: Stroke EXAM: RIGHT JUGULAR TUNNELED PICC LINE PLACEMENT WITH ULTRASOUND AND FLUOROSCOPIC  GUIDANCE MEDICATIONS: None ANESTHESIA/SEDATION: None FLUOROSCOPY TIME:  Fluoroscopy Time:  minutes 12 seconds (2 mGy). COMPLICATIONS: None immediate. PROCEDURE: The patient was advised of the possible risks and complications and agreed to undergo the procedure. The patient was then brought to the angiographic suite for the procedure. The right neck was prepped with chlorhexidine, draped in the usual sterile fashion using maximum barrier technique (cap and mask, sterile gown, sterile gloves, large sterile sheet, hand hygiene and cutaneous antiseptic). Local anesthesia was attained by infiltration with 1% lidocaine. Ultrasound demonstrated patency of the right jugular vein, and this was documented with an image. Under real-time ultrasound guidance, this vein was accessed with a 21 gauge micropuncture needle and image documentation was performed. The needle was exchanged over a guidewire for a peel-away sheath through which a 24 cm 5 [place] double lumen power injectable PICC was advanced, and positioned with its tip at the lower SVC/right atrial junction. The cuff was positioned in the subcutaneous tract. Fluoroscopy during the procedure and fluoro spot radiograph confirms appropriate catheter position. The catheter was flushed, secured to the skin with Prolene sutures, and covered with a sterile dressing. IMPRESSION: Successful placement of a tunneled right jugular PICC with sonographic and fluoroscopic guidance. The catheter is ready for use. Electronically Signed   By: Marybelle Killings M.D.   On: 03/11/2017 15:11   Ir US Guide Vasc Access Right  Result Date: 03/11/2017 INDICATION: Stroke EXAM: RIGHT JUGULAR TUNNELED PICC LINE PLACEMENT WITH ULTRASOUND AND FLUOROSCOPIC GUIDANCE MEDICATIONS: None ANESTHESIA/SEDATION: None FLUOROSCOPY TIME:  Fluoroscopy Time:  minutes 12 seconds (2 mGy). COMPLICATIONS: None immediate. PROCEDURE: The patient was advised of the possible risks and complications and agreed to undergo the  procedure. The patient was then brought to the angiographic suite for the procedure. The right neck was prepped with chlorhexidine, draped in the usual sterile fashion using maximum barrier technique (cap and mask, sterile gown, sterile gloves, large sterile sheet, hand hygiene and cutaneous antiseptic). Local anesthesia was attained by infiltration with 1% lidocaine. Ultrasound demonstrated patency of the right jugular vein, and this was documented with an image. Under real-time ultrasound guidance, this vein was accessed with a 21 gauge micropuncture needle and image documentation was performed. The needle was exchanged over a guidewire for a peel-away sheath through which a 24 cm 5 [place] double lumen power injectable PICC was advanced, and positioned with its tip at the lower SVC/right atrial junction. The cuff was positioned in the subcutaneous tract. Fluoroscopy during the procedure and fluoro spot radiograph confirms appropriate catheter position. The catheter was flushed, secured to the skin with Prolene sutures, and covered with a sterile dressing. IMPRESSION: Successful placement of a tunneled right jugular PICC with sonographic and fluoroscopic guidance. The catheter is ready for use. Electronically Signed  By: Marybelle Killings M.D.   On: 03/11/2017 15:11   Dg Chest Port 1 View  Result Date: 03/12/2017 CLINICAL DATA:  Pneumonia. EXAM: PORTABLE CHEST 1 VIEW COMPARISON:  Eleven scratched it 03/11/2017. FINDINGS: Interim placement of right IJ line, its tip is in the right atrium. Endotracheal tube the tube in stable position . Stable cardiomegaly. Pulmonary vascular prominence interstitial prominence, improved from prior exam. No pneumothorax . IMPRESSION: 1. Interim placement right IJ line, its tip is in the right atrium . Endotracheal tube and NG tube in stable position. 2. Cardiomegaly. Interim improvement pulmonary venous congestion and interstitial prominence suggesting improving congestive heart  failure . Electronically Signed   By: Marcello Moores  Register   On: 03/12/2017 07:15     STUDIES:  ECHO 10/27 >> study stopped prematurely due to pt movement, mild LVH, LVEF 55-60% CT Head 10/28 >> Stable to slight enlargement of left basal ganglia masses compatible with abscesses on MRI. Worsening surrounding edema with 5 mm of rightward midline shift Renal US 10/29 >> complex left-sided renal cysts, no obstructive uropathy, trace perinephric fluid is seen about both kidneys Venous Duplex UE's 11/2 >>  Venous Duplex LE's 11/2 >>   CULTURES: BCx2 10/26 >> negative Sputum 10/28 >> normal flora   UA 10/26 >> negative HIV 10/26 >> negative  BCx2 10/28 >> Brain Abscess 10/29 >>  BAL 11/1 >>   ANTIBIOTICS: Ceftriaxone 10/26 >> Metronidazole 10/26 >> Vancomycin 10/26 >> 10/27  SIGNIFICANT EVENTS: 10/28  Progressive decline in MS requiring intubation 10/29  Fever and shock overnight, requiring addition of phenylephrine gtt.  Brain abscess aspiration 10/30  Off vasopressors 10/31  No weaning, no change in neuro exam 11/02  Increased O2 needs overnight, resolved with increased PEEP  LINES/TUBES: ETT 10/28 >>  PICC 11/1 (IR) >>   DISCUSSION: 61 y/o M with L basal ganglia brain abscess. Per my assessment it was uncertain that the patient is able to maintain a patent airway, and therefore electively intubated. The patient was assessed by Neurology who requested a repeat head CT. Will accept in tx to CCM service for airway management in the ICU.  ASSESSMENT / PLAN:  PULMONARY A: Acute Hypoxic Respiratory Failure  Bilateral Lower Airspace Disease - improved with increased PEEP, suspect  He had atelectasis/de-recruitment  P:   PRVC 6 cc/kg > changed to 6cc 11/1, increase rate to 26 for rest mode Wean PEEP / FiO2 for sats > 92% Follow CXR  Daily SBT / WUA as tolerated Chest PT via bed  CARDIOVASCULAR A:  Septic Shock - in setting of brain abscess, resolved.  Off pressors 10/29.   HTN P:  ICU monitoring  PRN lopressor for SBP >170  RENAL A:   AKI - suspect in setting of shock/hypotension, sepsis.  Renal US negative for obstructive uropathy but showed complex L renal cyst.  Metabolic Acidosis - resolved  Hypernatremia Hypokalemia  P:   D5 1/4 NS @ 50 ml/hr  Increase free water 300 ml Q8  Trend BMP / urinary output Replace electrolytes as indicated Avoid nephrotoxic agents, ensure adequate renal perfusion  GASTROINTESTINAL A:   Obesity  Constipation  P:  NPO / OGT  TF per Nutrition  Add Senokot-S QHS PRN dulcolax suppository  Pepcid for SUP   HEMATOLOGIC A:   Leukocytosis  P:  Lovenox for DVT prophylaxis  Trend CBC  Monitor for bleeding  Assess extremities x4 given swelling, r/o DVT   INFECTIOUS A:   Brain Abscess - presumed odontogenic source  Fever - suspect secondary to above P:   ID following, appreciate input  ABX as above Follow cultures   ENDOCRINE A:   DM II    P:   SSI with resistant scale Novolog 4 units "meal coverage" Increase lantus to 22 units QD  NEUROLOGIC A:   Acute Encephalopathy in setting of Brain Abscess  Left Basal Ganglia Lesion s/p Stereotactic Aspiration (10/29) Cerebral Edema  P:   RASS goal: 0 to 01   NSGY following, appreciate input  Consider repeat CT imaging of head?  Will defer to Neurosurgery.   FAMILY  - Updates: No family available am 11/2 on NP rounding.   CC Time: 30 minutes   Noe Gens, NP-C [facility] Pgr: [phone] or if no answer [phone] 03/12/2017, 8:25 AM  ---------------------------------------  STAFF NOTE ?I, Evans Lance, MD have personally reviewed patient's chart data, including medical history, events of note, physical examination and test results as part of my evaluation. I have discussed with NP and other care providers involved this patient's care. I personally evaluated patient. Please see my comments as follows.  Tolerating cpap 10,  PS12, Fi02 60%,  Awakens when sedation lightened. Had BAL 11/1- airway looked clean. Tmax 100F, HD stable. Breath sounds clear abd-soft Weak on right, edematous UE CXR_ clearing lung bases Had PICC line 11/1 BAL- normal flora ECHO 10/27 >> study stopped prematurely due to pt movement, mild LVH, LVEF 55-60% CT Head 10/28 >> Stable to slight enlargement of left basal ganglia masses compatible with abscesses on MRI. Worsening surrounding edema with 5 mm of rightward midline shift Renal US 10/29 >> complex left-sided renal cysts, no obstructive uropathy, trace perinephric fluid is seen about both kidneys  61 y/o male with DM, s/p dental procedure, admitted with Altered mental status, Left basal ganglia brain abscess. Left cerebral abscesses,  s/p stereotactic aspiration, on Rocephin and Flagyl. Acute hypoxemic respiratory failure  ASSESSMENT / PLAN: PULMONARY  Acute Hypoxic Respiratory Failure   PRVC  VT 49m/kg  Wean PEEP / FiO2 for sats >92%  Extubate when mentation improved and oxygen requirement improved. Unable to do CTPA for PE. May do venous duplex of 4 limbs. Need to clarify with NSx if full anticoagulation is permissible now.  CARDIOVASCULAR  Septic Shock - in setting of brain abscess, resolved. Off pressors 10/29.  HTN  ICU monitoring  PRN lopressor for SBP > 170  RENAL AKI - suspect in setting of shock/hypotension, sepsis.  Renal [place] for obstructive uropathy but showed complex L renal cyst.  Metabolic Acidosis   AKI improving. Hypernatremia- Increased free water [id number], hypotonic fluids.d5...Marland KitchenMarland Kitchen4NS  @ 75 ml/hr  Trend BMP / urinary output Replace electrolytes as indicated Avoid nephrotoxic agents, ensure adequate renal perfusion   GASTROINTESTINAL  TF per Nutrition    HEMATOLOGIC Lovenox for DVT prophylaxis  Trend CBC Monitor for bleeding   INFECTIOUS Brain Abscess - presumed odontogenic source  ID following, appreciate input   ABX-Rocephin,Flgyl Follow cultures > await brain culture Continue condom cath     ENDOCRINE  DM II  SSI with resistant scale   lantus  22 units QD  NEUROLOGIC  Acute Encephalopathy in setting of Brain Abscess  Left Basal Ganglia Lesion s/p Stereotactic Aspiration (10/29) Cerebral Edema  RASS goal: 0 to -1 on Fentanyl Neurosurgery following, appreciate assistance   FAMILY  updated 11/1 in room      Thank you for letting me participate in the care of your patient. The  patient is critically ill with multiple organ systems failure and requires high complexity decision making for assessment and support, frequent evaluation and titration of therapies, application of advanced monitoring technologies and extensive interpretation of multiple databases like review of chart, labs, imaging, coordinating care with other physicians and healthcare team members.. ? Critical Care Time devoted to patient care services described in this note is 22 Minutes.?This time reflects time of care of this signee. This critical care time does not reflect procedure time, or teaching time or supervisory time of NP, but could involve care discussion time. Note subject to typographical and grammatical errors; Any formal questions or concerns about the content, text, or information contained within the body of this dictation should be directly addressed to the physician for clarification.     Evans Lance, MD  Pulmonary and [place] Pager: [phone]

## 2017-03-12 NOTE — Progress Notes (Signed)
CCM NP called earlier re: vent changes.  Pt was placed on cpap/psv 12 ps, 10 peep, 60% foi2 by CCM.  Currently pt appears to be tol well, no distress noted and breathing appears comfortable.  Per CCM- okay to continue to wean on cpap/psv 12 ps, 10 peep, and 60%  today if pt tolerates.  VSS currently.

## 2017-03-12 NOTE — Progress Notes (Signed)
Patient ID: Ludger Nutting, male   DOB: [birth year], 61 y.o.   MRN: [id number] Subjective:  the patient is more alert today. He is intubated and in no apparent distress.  Objective: Vital signs in last 24 hours: Temp:  [98.6 F (37 C)-100.9 F (38.3 C)] 100.9 F (38.3 C) (11/02 0301) Pulse Rate:  [90-112] 92 (11/02 0700) Resp:  [20-36] 24 (11/02 0700) BP: (110-160)/(59-84) 111/65 (11/02 0700) SpO2:  [90 %-99 %] 97 % (11/02 0700) FiO2 (%):  [60 %-100 %] 80 % (11/02 0400) Weight:  [113.8 kg (250 lb 14.1 oz)] 113.8 kg (250 lb 14.1 oz) (11/02 0500)  Intake/Output from previous day: 11/01 0701 - 11/02 0700 In: 3012.4 [I.V.:1292.4; NG/GT:1320; IV Piggyback:400] Out: 1900 [Urine:1900] Intake/Output this shift: No intake/output data recorded.  Physical exam Glasgow Coma Scale 10 intubated, [id number]. He localizes briskly on the left. His pupils are equal.he is right hemiplegic. His dressing is clean and dry.  Lab Results:  Recent Labs  03/11/17 0803 03/12/17 0458  WBC 12.3* 14.0*  HGB 17.2* 14.8  HCT 53.9* 47.5  PLT 189 178   BMET  Recent Labs  03/11/17 0803 03/12/17 0458  NA 152* 151*  K 4.2 4.2  CL 119* 118*  CO2 24 28  GLUCOSE 233* 339*  BUN 45* 45*  CREATININE 2.79* 2.69*  CALCIUM 8.6* 8.4*    Studies/Results: Ir Fluoro Guide Cv Line Right  Result Date: 03/11/2017 INDICATION: Stroke EXAM: RIGHT JUGULAR TUNNELED PICC LINE PLACEMENT WITH ULTRASOUND AND FLUOROSCOPIC GUIDANCE MEDICATIONS: None ANESTHESIA/SEDATION: None FLUOROSCOPY TIME:  Fluoroscopy Time:  minutes 12 seconds (2 mGy). COMPLICATIONS: None immediate. PROCEDURE: The patient was advised of the possible risks and complications and agreed to undergo the procedure. The patient was then brought to the angiographic suite for the procedure. The right neck was prepped with chlorhexidine, draped in the usual sterile fashion using maximum barrier technique (cap and mask, sterile gown, sterile gloves, large sterile sheet,  hand hygiene and cutaneous antiseptic). Local anesthesia was attained by infiltration with 1% lidocaine. Ultrasound demonstrated patency of the right jugular vein, and this was documented with an image. Under real-time ultrasound guidance, this vein was accessed with a 21 gauge micropuncture needle and image documentation was performed. The needle was exchanged over a guidewire for a peel-away sheath through which a 24 cm 5 [place] double lumen power injectable PICC was advanced, and positioned with its tip at the lower SVC/right atrial junction. The cuff was positioned in the subcutaneous tract. Fluoroscopy during the procedure and fluoro spot radiograph confirms appropriate catheter position. The catheter was flushed, secured to the skin with Prolene sutures, and covered with a sterile dressing. IMPRESSION: Successful placement of a tunneled right jugular PICC with sonographic and fluoroscopic guidance. The catheter is ready for use. Electronically Signed   By: Jolaine Click M.D.   On: 03/11/2017 15:11   Ir US Guide Vasc Access Right  Result Date: 03/11/2017 INDICATION: Stroke EXAM: RIGHT JUGULAR TUNNELED PICC LINE PLACEMENT WITH ULTRASOUND AND FLUOROSCOPIC GUIDANCE MEDICATIONS: None ANESTHESIA/SEDATION: None FLUOROSCOPY TIME:  Fluoroscopy Time:  minutes 12 seconds (2 mGy). COMPLICATIONS: None immediate. PROCEDURE: The patient was advised of the possible risks and complications and agreed to undergo the procedure. The patient was then brought to the angiographic suite for the procedure. The right neck was prepped with chlorhexidine, draped in the usual sterile fashion using maximum barrier technique (cap and mask, sterile gown, sterile gloves, large sterile sheet, hand hygiene and cutaneous antiseptic). Local anesthesia was attained by  infiltration with 1% lidocaine. Ultrasound demonstrated patency of the right jugular vein, and this was documented with an image. Under real-time ultrasound guidance, this vein  was accessed with a 21 gauge micropuncture needle and image documentation was performed. The needle was exchanged over a guidewire for a peel-away sheath through which a 24 cm 5 [place] double lumen power injectable PICC was advanced, and positioned with its tip at the lower SVC/right atrial junction. The cuff was positioned in the subcutaneous tract. Fluoroscopy during the procedure and fluoro spot radiograph confirms appropriate catheter position. The catheter was flushed, secured to the skin with Prolene sutures, and covered with a sterile dressing. IMPRESSION: Successful placement of a tunneled right jugular PICC with sonographic and fluoroscopic guidance. The catheter is ready for use. Electronically Signed   By: Jolaine ClickArthur  Hoss M.D.   On: 03/11/2017 15:11   Dg Chest Port 1 View  Result Date: 03/12/2017 CLINICAL DATA:  Pneumonia. EXAM: PORTABLE CHEST 1 VIEW COMPARISON:  Eleven scratched it 03/11/2017. FINDINGS: Interim placement of right IJ line, its tip is in the right atrium. Endotracheal tube the tube in stable position . Stable cardiomegaly. Pulmonary vascular prominence interstitial prominence, improved from prior exam. No pneumothorax . IMPRESSION: 1. Interim placement right IJ line, its tip is in the right atrium . Endotracheal tube and NG tube in stable position. 2. Cardiomegaly. Interim improvement pulmonary venous congestion and interstitial prominence suggesting improving congestive heart failure . Electronically Signed   By: Maisie Fushomas  Register   On: 03/12/2017 07:15   Dg Chest Port 1 View  Result Date: 03/11/2017 CLINICAL DATA:  Check endotracheal tube placement EXAM: PORTABLE CHEST 1 VIEW COMPARISON:  03/10/2017 FINDINGS: Endotracheal tube and nasogastric catheter are again noted and stable. Cardiac shadow is enlarged but stable. Bibasilar infiltrates with associated effusions are again seen and mildly increased when compare with the prior exam. No bony abnormality is noted. IMPRESSION: Tubes and  lines as described. Increasing bibasilar changes right greater than left. Electronically Signed   By: Alcide CleverMark  Lukens M.D.   On: 03/11/2017 09:02    Assessment/Plan: Brain abscesses:He is on empiric antibiotics.  LOS: 7 days     Ayianna Darnold D 03/12/2017, 7:40 AM

## 2017-03-13 ENCOUNTER — Inpatient Hospital Stay (HOSPITAL_COMMUNITY)

## 2017-03-13 ENCOUNTER — Encounter (HOSPITAL_COMMUNITY): Payer: Self-pay

## 2017-03-13 DIAGNOSIS — G9341 Metabolic encephalopathy: Secondary | ICD-10-CM

## 2017-03-13 LAB — BLOOD GAS, ARTERIAL
Acid-Base Excess: 2.9 mmol/L — ABNORMAL HIGH (ref 0.0–2.0)
Bicarbonate: 27.4 mmol/L (ref 20.0–28.0)
DRAWN BY: 511911
FIO2: 50
MECHVT: 440 mL
O2 Saturation: 92.8 %
PEEP/CPAP: 10 cmH2O
PO2 ART: 68.6 mmHg — AB (ref 83.0–108.0)
Patient temperature: 99.3
RATE: 26 resp/min
pCO2 arterial: 46.8 mmHg (ref 32.0–48.0)
pH, Arterial: 7.387 (ref 7.350–7.450)

## 2017-03-13 LAB — BASIC METABOLIC PANEL
ANION GAP: 7 (ref 5–15)
BUN: 45 mg/dL — AB (ref 6–20)
CHLORIDE: 115 mmol/L — AB (ref 101–111)
CO2: 27 mmol/L (ref 22–32)
Calcium: 8.3 mg/dL — ABNORMAL LOW (ref 8.9–10.3)
Creatinine, Ser: 2.54 mg/dL — ABNORMAL HIGH (ref 0.61–1.24)
GFR calc Af Amer: 30 mL/min — ABNORMAL LOW (ref >60)
GFR calc non Af Amer: 26 mL/min — ABNORMAL LOW (ref >60)
Glucose, Bld: 262 mg/dL — ABNORMAL HIGH (ref 65–99)
POTASSIUM: 3.6 mmol/L (ref 3.5–5.1)
SODIUM: 149 mmol/L — AB (ref 135–145)

## 2017-03-13 LAB — CBC
HEMATOCRIT: 47.4 % (ref 39.0–52.0)
Hemoglobin: 14.6 g/dL (ref 13.0–17.0)
MCH: 29.4 pg (ref 26.0–34.0)
MCHC: 30.8 g/dL (ref 30.0–36.0)
MCV: 95.4 fL (ref 78.0–100.0)
Platelets: 200 10*3/uL (ref 150–400)
RBC: 4.97 MIL/uL (ref 4.22–5.81)
RDW: 16.6 % — AB (ref 11.5–15.5)
WBC: 12.6 10*3/uL — ABNORMAL HIGH (ref 4.0–10.5)

## 2017-03-13 LAB — MAGNESIUM: MAGNESIUM: 2.5 mg/dL — AB (ref 1.7–2.4)

## 2017-03-13 LAB — GLUCOSE, CAPILLARY
GLUCOSE-CAPILLARY: 202 mg/dL — AB (ref 65–99)
GLUCOSE-CAPILLARY: 203 mg/dL — AB (ref 65–99)
Glucose-Capillary: 258 mg/dL — ABNORMAL HIGH (ref 65–99)
Glucose-Capillary: 241 mg/dL — ABNORMAL HIGH (ref 65–99)
Glucose-Capillary: 219 mg/dL — ABNORMAL HIGH (ref 65–99)

## 2017-03-13 MED ORDER — FREE WATER
300.0000 mL | Freq: Four times a day (QID) | Status: DC
  Administered 2017-03-13 – 2017-03-14 (×4): 300 mL

## 2017-03-13 MED ORDER — POTASSIUM CHLORIDE 20 MEQ/15ML (10%) PO SOLN
40.0000 meq | Freq: Once | ORAL | Status: AC
  Administered 2017-03-13: 40 meq
  Filled 2017-03-13: qty 30

## 2017-03-13 MED ORDER — FUROSEMIDE 10 MG/ML IJ SOLN
40.0000 mg | Freq: Once | INTRAMUSCULAR | Status: AC
  Administered 2017-03-13: 40 mg via INTRAVENOUS
  Filled 2017-03-13: qty 4

## 2017-03-13 MED ORDER — ALBUTEROL SULFATE (2.5 MG/3ML) 0.083% IN NEBU
2.5000 mg | INHALATION_SOLUTION | Freq: Three times a day (TID) | RESPIRATORY_TRACT | Status: DC
  Administered 2017-03-13 – 2017-03-22 (×28): 2.5 mg via RESPIRATORY_TRACT
  Filled 2017-03-13 (×27): qty 3

## 2017-03-13 NOTE — Progress Notes (Signed)
No acute events PERRL Localizing on left, densely paretic on right Incision c/d/i Continue antibiotics

## 2017-03-13 NOTE — Clinical Social Work Note (Signed)
Clinical Social Worker met with patient wife at bedside to answer questions regarding power of attorney and guardianship.  CSW provided resources for patient wife to go to an attorney to obtain financial power of attorney and to the DA to petition for guardianship.  Patient wife verbalized understanding and appreciation for support.  No further social work needs at this time.  Barbette Or, [place]

## 2017-03-13 NOTE — Progress Notes (Signed)
PULMONARY / CRITICAL CARE MEDICINE   Name: Timothy Jordan MRN: [id number] DOB: [birth year]    ADMISSION DATE:  03/05/2017 CONSULTATION DATE: March 07, 2017  REFERRING MD: Dr. Cyndy Freeze  CHIEF COMPLAINT: Inability to protect airway, brain abscess  HISTORY OF PRESENT ILLNESS:   61 year old male admitted with a brain abscess, developed encephalopathy requiring intubation.  Pulmonary and critical care medicine consulted for ventilator management.   SUBJECTIVE:  Weaning on pressure support, oxygenation improved  VITAL SIGNS: BP 119/70   Pulse 95   Temp 99.2 F (37.3 C) (Axillary)   Resp (!) 22   Ht _0  (1.778 m) Comment: per family  Wt 113.6 kg (250 lb 7.1 oz)   SpO2 94%   BMI 35.93 kg/m   HEMODYNAMICS:    VENTILATOR SETTINGS: Vent Mode: PSV;CPAP FiO2 (%):  [40 %-60 %] 40 % Set Rate:  [26 bmp] 26 bmp Vt Set:  [440 mL] 440 mL PEEP:  [10 cmH20] 10 cmH20 Pressure Support:  [12 cmH20] 12 cmH20 Plateau Pressure:  [15 cmH20-18 cmH20] 18 cmH20  INTAKE / OUTPUT: I/O last 3 completed shifts: In: 6053.8 [I.V.:2633.8; NG/GT:2970; IV Piggyback:450] Out: 2263 [Urine:3275]  PHYSICAL EXAMINATION:  General:  In bed on vent HENT: scalp wound well dressed ETT in place PULM: CTA B, vent supported breathing CV: RRR, no mgr GI: BS+, soft, nontender MSK: normal bulk and tone Neuro: on vent, not following commands    LABS:  BMET  Recent Labs Lab 03/11/17 0803 03/12/17 0458 03/13/17 0422  NA 152* 151* 149*  K 4.2 4.2 3.6  CL 119* 118* 115*  CO2 _1 BUN 45* 45* 45*  CREATININE 2.79* 2.69* 2.54*  GLUCOSE 233* 339* 262*    Electrolytes  Recent Labs Lab 03/08/17 0818  03/10/17 0242 03/11/17 0803 03/12/17 0458 03/13/17 0422  CALCIUM  --   < > 8.2* 8.6* 8.4* 8.3*  MG 2.0  --  2.2 2.5*  --  2.5*  PHOS 4.0  --   --  3.7  --   --   < > = values in this interval not displayed.  CBC  Recent Labs Lab 03/11/17 0803 03/12/17 0458 03/13/17 0422  WBC 12.3*  14.0* 12.6*  HGB 17.2* 14.8 14.6  HCT 53.9* 47.5 47.4  PLT 189 178 200    Coag's No results for input(s): APTT, INR in the last 168 hours.  Sepsis Markers  Recent Labs Lab 03/08/17 0818 03/08/17 1131 03/09/17 0421 03/10/17 0242  LATICACIDVEN 1.5 1.4  --   --   PROCALCITON 3.36  --  1.89 1.64    ABG  Recent Labs Lab 03/11/17 0420 03/12/17 0321 03/13/17 0352  PHART 7.375 7.327* 7.387  PCO2ART 47.1 54.8* 46.8  PO2ART 61.7* 70.0* 68.6*    Liver Enzymes  Recent Labs Lab 03/11/17 0803  AST 33  ALT 16*  ALKPHOS 82  BILITOT 0.4  ALBUMIN 2.1*    Cardiac Enzymes  Recent Labs Lab 03/06/17 1203 03/06/17 1645 03/06/17 2230  TROPONINI 0.46* 0.50* 0.51*    Glucose  Recent Labs Lab 03/12/17 0727 03/12/17 1141 03/12/17 1605 03/12/17 1958 03/12/17 2352 03/13/17 0757  GLUCAP 249* 255* 189* 222* 241* 202*    Imaging No results found.   STUDIES:  ECHO 10/27 >> study stopped prematurely due to pt movement, mild LVH, LVEF 55-60% CT Head 10/28 >> Stable to slight enlargement of left basal ganglia masses compatible with abscesses on MRI. Worsening surrounding edema with 5 mm of rightward midline shift  Renal US 10/29 >> complex left-sided renal cysts, no obstructive uropathy, trace perinephric fluid is seen about both kidneys Venous Duplex UE's 11/2 >>  Venous Duplex LE's 11/2 >>   CULTURES: BCx2 10/26 >> negative Sputum 10/28 >> normal flora   UA 10/26 >> negative HIV 10/26 >> negative  BCx2 10/28 >> GPC pairs 1/2 Brain Abscess 10/29 >> few GPC BAL 11/1 >>   ANTIBIOTICS: Ceftriaxone 10/26 >> Metronidazole 10/26 >> Vancomycin 10/26 >> 10/27  SIGNIFICANT EVENTS: 10/28  Progressive decline in MS requiring intubation 10/29  Fever and shock overnight, requiring addition of phenylephrine gtt.  Brain abscess aspiration 10/30  Off vasopressors 10/31  No weaning, no change in neuro exam 11/02  Increased O2 needs overnight, resolved with increased  PEEP  LINES/TUBES: ETT 10/28 >>  PICC 11/1 (IR) >>   DISCUSSION: 61 year old male with diabetes admitted with a brain abscess.    ASSESSMENT / PLAN:  NEUROLOGIC A:   Brain abscess Acute encephalopathy Status post brain abscess drainage P:   RASS goal: 0 As needed sedation protocol Antibiotics per infectious disease Wound care, post neurosurgery care per neurosurgery team  PULMONARY A: Acute respiratory failure with hypoxemia Obesity, atelectasis P:   Continue full ventilatory support Wean PEEP Pressure support today as long as tolerated Hold off on extubation until mental status improves Daily wakeup assessment and spontaneous breathing trial Daily chest x-ray Ventilator associated pneumonia prevention protocol  CARDIOVASCULAR A:  Septic shock, resolved Hypertension P:  Telemetry monitoring Monitor blood pressure  RENAL A:   Acute kidney injury, mildly improved Hypernatremia Hypokalemia P:   Stop D5 1/4 NS Increase free water via tube Replace K Monitor BMET and UOP Replace electrolytes as needed   GASTROINTESTINAL A:   Constipation bowel regimen as needed  P:   Continue tube feedings Continue Pepcid for stress ulcer prophylaxis  HEMATOLOGIC A:   No acute issues P:  Continue Lovenox for DVT prophylaxis Trend CBC, monitor for bleeding  INFECTIOUS A:   Brain abscess P:   Antibiotics per infectious disease Follow-up cultures  ENDOCRINE A:   Diabetes type 2 P:   Stop D5 Sliding scale insulin    FAMILY  - Updates: none bedside 03/13/2017   - Inter-disciplinary family meet or Palliative Care meeting due by:  day 7  My cc time 35 minutes  Roselie Awkward, MD [facility] Pager: [phone] Cell: [phone] After 3pm or if no response, call [phone]   03/13/2017, 8:04 AM

## 2017-03-13 NOTE — Progress Notes (Addendum)
eLink Physician-Brief Progress Note Patient Name: Ludger NuttingKelvin M Ellzey DOB: [birth year] MRN: [id number]   Date of Service  03/13/2017  HPI/Events of Note  Hypoxia - Now on 100% and PEEP 8. Sat = 90% by oximetry. + Addendum - CXR reveals cardiomegaly, pulmonary edema and atelectasis.   eICU Interventions  Will order: 1. Increase PEEP to 10 cm H2O.  2. Lasix 40 mg IV now.  3. Portable CXR STAT.      Intervention Category Major Interventions: Hypoxemia - evaluation and management;Respiratory failure - evaluation and management  Renezmae Canlas Dennard Nipugene 03/13/2017, 9:07 PM

## 2017-03-14 ENCOUNTER — Inpatient Hospital Stay (HOSPITAL_COMMUNITY)

## 2017-03-14 ENCOUNTER — Other Ambulatory Visit (HOSPITAL_COMMUNITY): Payer: Self-pay

## 2017-03-14 DIAGNOSIS — M7989 Other specified soft tissue disorders: Secondary | ICD-10-CM

## 2017-03-14 DIAGNOSIS — J9622 Acute and chronic respiratory failure with hypercapnia: Secondary | ICD-10-CM

## 2017-03-14 LAB — CULTURE, BAL-QUANTITATIVE W GRAM STAIN: Culture: 40000 — AB

## 2017-03-14 LAB — BASIC METABOLIC PANEL
Anion gap: 7 (ref 5–15)
BUN: 47 mg/dL — AB (ref 6–20)
CHLORIDE: 117 mmol/L — AB (ref 101–111)
CO2: 28 mmol/L (ref 22–32)
CREATININE: 2.57 mg/dL — AB (ref 0.61–1.24)
Calcium: 8.6 mg/dL — ABNORMAL LOW (ref 8.9–10.3)
GFR calc Af Amer: 30 mL/min — ABNORMAL LOW (ref >60)
GFR calc non Af Amer: 26 mL/min — ABNORMAL LOW (ref >60)
Glucose, Bld: 236 mg/dL — ABNORMAL HIGH (ref 65–99)
Potassium: 4.2 mmol/L (ref 3.5–5.1)
Sodium: 152 mmol/L — ABNORMAL HIGH (ref 135–145)

## 2017-03-14 LAB — CBC WITH DIFFERENTIAL/PLATELET
BASOS ABS: 0 10*3/uL (ref 0.0–0.1)
Basophils Relative: 0 %
Eosinophils Absolute: 0.4 10*3/uL (ref 0.0–0.7)
Eosinophils Relative: 3 %
HEMATOCRIT: 46.4 % (ref 39.0–52.0)
HEMOGLOBIN: 14.2 g/dL (ref 13.0–17.0)
LYMPHS PCT: 12 %
Lymphs Abs: 1.5 10*3/uL (ref 0.7–4.0)
MCH: 29.2 pg (ref 26.0–34.0)
MCHC: 30.6 g/dL (ref 30.0–36.0)
MCV: 95.3 fL (ref 78.0–100.0)
MONOS PCT: 11 %
Monocytes Absolute: 1.4 10*3/uL — ABNORMAL HIGH (ref 0.1–1.0)
NEUTROS PCT: 74 %
Neutro Abs: 9.2 10*3/uL — ABNORMAL HIGH (ref 1.7–7.7)
Platelets: 215 10*3/uL (ref 150–400)
RBC: 4.87 MIL/uL (ref 4.22–5.81)
RDW: 16.5 % — ABNORMAL HIGH (ref 11.5–15.5)
WBC: 12.5 10*3/uL — ABNORMAL HIGH (ref 4.0–10.5)

## 2017-03-14 LAB — PROTIME-INR
INR: 1.38
PROTHROMBIN TIME: 16.8 s — AB (ref 11.4–15.2)

## 2017-03-14 LAB — CULTURE, RESPIRATORY W GRAM STAIN

## 2017-03-14 LAB — AEROBIC/ANAEROBIC CULTURE W GRAM STAIN (SURGICAL/DEEP WOUND)

## 2017-03-14 LAB — GLUCOSE, CAPILLARY
GLUCOSE-CAPILLARY: 166 mg/dL — AB (ref 65–99)
GLUCOSE-CAPILLARY: 192 mg/dL — AB (ref 65–99)
GLUCOSE-CAPILLARY: 235 mg/dL — AB (ref 65–99)
Glucose-Capillary: 238 mg/dL — ABNORMAL HIGH (ref 65–99)
Glucose-Capillary: 190 mg/dL — ABNORMAL HIGH (ref 65–99)

## 2017-03-14 LAB — AEROBIC/ANAEROBIC CULTURE (SURGICAL/DEEP WOUND)

## 2017-03-14 LAB — CULTURE, RESPIRATORY

## 2017-03-14 LAB — CULTURE, BAL-QUANTITATIVE

## 2017-03-14 MED ORDER — DEXAMETHASONE SODIUM PHOSPHATE 10 MG/ML IJ SOLN
10.0000 mg | Freq: Once | INTRAMUSCULAR | Status: AC
  Administered 2017-03-14: 10 mg via INTRAVENOUS
  Filled 2017-03-14: qty 1

## 2017-03-14 MED ORDER — SODIUM CHLORIDE 0.9 % IV SOLN
1000.0000 mg | Freq: Once | INTRAVENOUS | Status: AC
  Administered 2017-03-14: 1000 mg via INTRAVENOUS
  Filled 2017-03-14: qty 10

## 2017-03-14 MED ORDER — FENTANYL CITRATE (PF) 100 MCG/2ML IJ SOLN: 100.0000 ug | Freq: Once | INTRAMUSCULAR | Status: DC

## 2017-03-14 MED ORDER — ROCURONIUM BROMIDE 50 MG/5ML IV SOLN: 50.0000 mg | Freq: Once | INTRAVENOUS | Status: DC

## 2017-03-14 MED ORDER — PANTOPRAZOLE SODIUM 40 MG IV SOLR
40.0000 mg | INTRAVENOUS | Status: DC
  Administered 2017-03-14 – 2017-03-21 (×8): 40 mg via INTRAVENOUS
  Filled 2017-03-14 (×8): qty 40

## 2017-03-14 MED ORDER — PROPOFOL 1000 MG/100ML IV EMUL
INTRAVENOUS | Status: AC
  Filled 2017-03-14: qty 100

## 2017-03-14 MED ORDER — SODIUM CHLORIDE 0.9 % IV SOLN
750.0000 mg | Freq: Two times a day (BID) | INTRAVENOUS | Status: DC
  Administered 2017-03-14 – 2017-03-24 (×21): 750 mg via INTRAVENOUS
  Filled 2017-03-14 (×23): qty 7.5

## 2017-03-14 MED ORDER — ETOMIDATE 2 MG/ML IV SOLN
40.0000 mg | Freq: Once | INTRAVENOUS | Status: AC
  Administered 2017-03-14: 20 mg via INTRAVENOUS

## 2017-03-14 MED ORDER — PROPOFOL 500 MG/50ML IV EMUL
5.0000 ug/kg/min | INTRAVENOUS | Status: DC
  Administered 2017-03-14: 10 ug/kg/min via INTRAVENOUS

## 2017-03-14 MED ORDER — FENTANYL CITRATE (PF) 100 MCG/2ML IJ SOLN
200.0000 ug | Freq: Once | INTRAMUSCULAR | Status: DC
Start: 2017-03-14 — End: 2017-03-20

## 2017-03-14 MED ORDER — MIDAZOLAM HCL 2 MG/2ML IJ SOLN
2.0000 mg | Freq: Once | INTRAMUSCULAR | Status: DC
  Filled 2017-03-14: qty 2

## 2017-03-14 MED ORDER — DEXAMETHASONE SODIUM PHOSPHATE 4 MG/ML IJ SOLN
4.0000 mg | Freq: Four times a day (QID) | INTRAMUSCULAR | Status: DC
  Administered 2017-03-14 – 2017-03-21 (×28): 4 mg via INTRAVENOUS
  Filled 2017-03-14 (×29): qty 1

## 2017-03-14 MED ORDER — ETOMIDATE 2 MG/ML IV SOLN
20.0000 mg | Freq: Once | INTRAVENOUS | Status: DC
Start: 2017-03-14 — End: 2017-03-14

## 2017-03-14 MED ORDER — FREE WATER
300.0000 mL | Status: DC
  Administered 2017-03-15 – 2017-03-22 (×42): 300 mL

## 2017-03-14 MED ORDER — MIDAZOLAM HCL 2 MG/2ML IJ SOLN
4.0000 mg | Freq: Once | INTRAMUSCULAR | Status: AC
  Administered 2017-03-14: 2 mg via INTRAVENOUS
  Filled 2017-03-14: qty 4

## 2017-03-14 MED ORDER — VECURONIUM BROMIDE 10 MG IV SOLR
10.0000 mg | Freq: Once | INTRAVENOUS | Status: AC
  Administered 2017-03-14: 10 mg via INTRAVENOUS
  Filled 2017-03-14: qty 10

## 2017-03-14 NOTE — Progress Notes (Signed)
PULMONARY / CRITICAL CARE MEDICINE   Name: Timothy Jordan MRN: [id number] DOB: [birth year]    ADMISSION DATE:  03/05/2017 CONSULTATION DATE: March 07, 2017  REFERRING MD: Dr. Cyndy Freeze  CHIEF COMPLAINT: Inability to protect airway, brain abscess  HISTORY OF PRESENT ILLNESS:   61 year old male admitted with a brain abscess, developed encephalopathy requiring intubation.  Pulmonary and critical care medicine consulted for ventilator management.   SUBJECTIVE:  Worsening oxygenation overnight, given diuresis Does better with higher tidal volumes Not much improvement in mental status   VITAL SIGNS: BP 107/65   Pulse 89   Temp 98.2 F (36.8 C) (Axillary)   Resp (!) 21   Ht _0  (1.778 m) Comment: per family  Wt 114.5 kg (252 lb 6.8 oz)   SpO2 94%   BMI 36.22 kg/m   HEMODYNAMICS:    VENTILATOR SETTINGS: Vent Mode: PRVC FiO2 (%):  [40 %-50 %] 50 % Set Rate:  [26 bmp] 26 bmp Vt Set:  [440 mL] 440 mL PEEP:  [8 cmH20-10 cmH20] 10 cmH20 Pressure Support:  [12 cmH20] 12 cmH20 Plateau Pressure:  [20 ZTI45-80 cmH20] 24 cmH20  INTAKE / OUTPUT: I/O last 3 completed shifts: In: 4898.1 [I.V.:1323.1; NG/GT:3125; IV Piggyback:450] Out: 9983 [Urine:3990]  PHYSICAL EXAMINATION:  General:  In bed on vent HENT: scalp wound well healed, ETT in place PULM: CTA B, vent supported breathing CV: RRR, no mgr GI: BS+, soft, nontender MSK: normal bulk and tone Neuro: awake on vent but doesn't follow commands      LABS:  BMET Recent Labs  Lab 03/12/17 0458 03/13/17 0422 03/14/17 0449  NA 151* 149* 152*  K 4.2 3.6 4.2  CL 118* 115* 117*  CO2 _1 BUN 45* 45* 47*  CREATININE 2.69* 2.54* 2.57*  GLUCOSE 339* 262* 236*    Electrolytes Recent Labs  Lab 03/08/17 0818  03/10/17 0242 03/11/17 0803 03/12/17 0458 03/13/17 0422 03/14/17 0449  CALCIUM  --    < > 8.2* 8.6* 8.4* 8.3* 8.6*  MG 2.0  --  2.2 2.5*  --  2.5*  --   PHOS 4.0  --   --  3.7  --   --   --    <  > = values in this interval not displayed.    CBC Recent Labs  Lab 03/12/17 0458 03/13/17 0422 03/14/17 0449  WBC 14.0* 12.6* 12.5*  HGB 14.8 14.6 14.2  HCT 47.5 47.4 46.4  PLT 178 200 215    Coag's No results for input(s): APTT, INR in the last 168 hours.  Sepsis Markers Recent Labs  Lab 03/08/17 0818 03/08/17 1131 03/09/17 0421 03/10/17 0242  LATICACIDVEN 1.5 1.4  --   --   PROCALCITON 3.36  --  1.89 1.64    ABG Recent Labs  Lab 03/11/17 0420 03/12/17 0321 03/13/17 0352  PHART 7.375 7.327* 7.387  PCO2ART 47.1 54.8* 46.8  PO2ART 61.7* 70.0* 68.6*    Liver Enzymes Recent Labs  Lab 03/11/17 0803  AST 33  ALT 16*  ALKPHOS 82  BILITOT 0.4  ALBUMIN 2.1*    Cardiac Enzymes No results for input(s): TROPONINI, PROBNP in the last 168 hours.  Glucose Recent Labs  Lab 03/13/17 1128 03/13/17 1530 03/13/17 2006 03/13/17 2333 03/14/17 0401 03/14/17 0729  GLUCAP 258* 241* 219* 203* 238* 190*    Imaging Dg Chest Port 1 View  Result Date: 03/14/2017 CLINICAL DATA:  61 year old male with brain abscesses. Respiratory failure. EXAM: PORTABLE CHEST 1 VIEW COMPARISON:  03/13/2017 and earlier. FINDINGS: Portable AP semi upright view at 0616 hours. Stable endotracheal tube. Enteric tube is looped at the level of the stomach. Stable right IJ central line. Borderline to mild cardiomegaly. Other mediastinal contours are within normal limits. No pneumothorax. Continued diffuse bilateral pulmonary interstitial opacity with mildly confluent opacity at both lung bases. However, basilar ventilation appears improved since yesterday. Small if any pleural effusions. No areas of worsening ventilation. IMPRESSION: 1.  Stable lines and tubes. 2. Continued bilateral pulmonary interstitial opacity since 03/07/2017 which could be acute (atypical infection, interstitial edema) or chronic. 3. Interval improved lung base ventilation with patchy residual opacity. Small if any pleural  effusions. Electronically Signed   By: Genevie Ann M.D.   On: 03/14/2017 08:13   Dg Chest Port 1 View  Result Date: 03/13/2017 CLINICAL DATA:  Initial evaluation for hypoxia. EXAM: PORTABLE CHEST 1 VIEW COMPARISON:  Prior radiograph from earlier the same day. FINDINGS: Patient is intubated with the tip of the endotracheal tube positioned 5.3 cm above the carina. Enteric tube courses in the abdomen right-sided central venous catheter is grossly similar terminating at the right atrium. Cardiomegaly grossly stable. Mediastinal silhouette grossly within normal limits allowing for patient positioning. Lungs hypoinflated. Diffuse interstitial prominence, most likely reflecting pulmonary interstitial edema, similar to previous. Elevation of the right hemidiaphragm with increased right basilar opacity, which may reflect progressive atelectasis or consolidation. Mild left basilar opacity may reflect atelectasis or infiltrate as well. No pneumothorax. Suspected small right pleural effusion. IMPRESSION: 1. Support apparatus in stable position as above. 2. Cardiomegaly with diffuse pulmonary interstitial edema, similar to previous. 3. Hypoinflation with elevation of the right hemidiaphragm, with increased right basilar opacity, which may reflect progressive atelectasis or infiltrate. Electronically Signed   By: Jeannine Boga M.D.   On: 03/13/2017 21:45     STUDIES:  ECHO 10/27 >> study stopped prematurely due to pt movement, mild LVH, LVEF 55-60% CT Head 10/28 >> Stable to slight enlargement of left basal ganglia masses compatible with abscesses on MRI. Worsening surrounding edema with 5 mm of rightward midline shift Renal US 10/29 >> complex left-sided renal cysts, no obstructive uropathy, trace perinephric fluid is seen about both kidneys   CULTURES: BCx2 10/26 >> negative Sputum 10/28 >> normal flora   UA 10/26 >> negative HIV 10/26 >> negative  BCx2 10/28 >> GPC pairs 1/2 Brain Abscess 10/29 >> few  GPC BAL 11/1 >> rare yeast  ANTIBIOTICS: Ceftriaxone 10/26 >> Metronidazole 10/26 >> Vancomycin 10/26 >> 10/27  SIGNIFICANT EVENTS: 10/28  Progressive decline in MS requiring intubation 10/29  Fever and shock overnight, requiring addition of phenylephrine gtt.  Brain abscess aspiration 10/30  Off vasopressors 10/31  No weaning, no change in neuro exam 11/02  Increased O2 needs overnight, resolved with increased PEEP  LINES/TUBES: ETT 10/28 >>  PICC 11/1 (IR) >>   DISCUSSION: 61 year old male with diabetes admitted with a brain abscess.    ASSESSMENT / PLAN:  NEUROLOGIC A:   Brain abscess Acute encephalopathy Status post brain abscess drainage P:   RASS goal 0 As needed sedation protocol Antibiotics per infectious disease Wound care, and post neurosurgery care per neurosurgery team.   PULMONARY A: Acute respiratory failure with hypoxemia Obesity, atelectasis P:   Continue full ventilatory support Change full vent support to pressure control to allow for larger tidal volumes Pressure support during daytime Maintain high PEEP given obesity Wean off FiO2 Needs tracheostomy VAP prevention   CARDIOVASCULAR A:  Septic shock, resolved  Hypertension P:  Telemetry monitoring Monitor blood pressure   RENAL A:   Acute kidney injury, mildly improved Hypernatremia Hypokalemia P:   Increase free water via tube Monitor BMET and UOP Replace electrolytes as needed   GASTROINTESTINAL A:   Constipation bowel regimen as needed  P:   Continue tube feedings Continue Pepcid for stress ulcer prophylaxis   HEMATOLOGIC A:   No acute issues P:  Continue Lovenox for DVT prophylaxis Trend CBC, monitor for bleeding   INFECTIOUS A:   Brain abscess P:   Antibiotics per infectious disease Follow-up culture   ENDOCRINE A:   Diabetes type 2 P:   Sliding scale insulin    FAMILY  - Updates: none bedside 03/14/2017   - Inter-disciplinary family meet  or Palliative Care meeting due by:  day 7  My cc time 34 minutes  Roselie Awkward, MD [facility] Pager: [phone] Cell: [phone] After 3pm or if no response, call [phone]   03/14/2017, 8:47 AM

## 2017-03-14 NOTE — Progress Notes (Signed)
No acute changes PERRL Localizes left, hemiparetic on right Neuro exam much worse than expected given scan He has a fair amount of edema so I'm starting him on decadron Also, given temporal edema may have some component of subclinical seizure activity I'm starting him on Keppra Continue abx

## 2017-03-14 NOTE — Progress Notes (Signed)
Tube had to be advanced back to 25 cm from sliding out to 20 cm. NO further issues at this time. BBS even throughout.

## 2017-03-14 NOTE — Procedures (Signed)
PCCM Video Bronchoscopy Procedure Note  The patient was informed of the risks (including but not limited to bleeding, infection, respiratory failure, lung injury, tooth/oral injury) and benefits of the procedure and gave consent, see chart.  Indication: Acute respiratory failure with hypoxemia, brain abscess  Post Procedure Diagnosis: Acute respiratory failure with hypoxemia, brain abscess  Location: 78M ICU  Condition pre procedure: Critically ill, on vent  Medications for procedure: versed injection, fentanyl infusion, propofol infusion, etomidate 20mg , rocuronium IV  Procedure description: The bronchoscope was introduced through the endotracheal tube and passed to the bilateral lungs to the level of the subsegmental bronchi throughout the tracheobronchial tree.  Airway exam revealed no mucus plugging, normal airway mucosa, no mass or lesion.  The image from the bronchoscope was used to retract the endotracheal tube to guide the percutaneous tracheostomy.    Procedures performed: none  Specimens sent: none  Condition post procedure: critically ill, on vent  EBL: none from procedure  Complications: none from procedure  Heber CarolinaBrent Ashyia Schraeder, MD [facility] Pager: [phone] Cell: [phone] After 3pm or if no response, call [phone]

## 2017-03-14 NOTE — Procedures (Signed)
Percutaneous Tracheostomy Placement  Consent from family.  Patient sedated, paralyzed and position.  Placed on 100% FiO2 and RR matched.  Area cleaned and draped.  Lidocaine/epi injected.  Skin incision done followed by blunt dissection.  Trachea palpated then punctured, catheter passed and visualized bronchoscopically.  Wire placed and visualized.  Catheter removed.  Airway then entered and dilated.  Size 6 cuffed shiley trach placed and visualized bronchoscopically well above carina.  Good volume returns.  Patient tolerated the procedure well without complications.  Minimal blood loss.  CXR ordered and pending.  Wesam G. Yacoub, M.D. [facility]. Pager: [phone]. After hours pager: [phone].  

## 2017-03-14 NOTE — Progress Notes (Signed)
Bilateral upper extremity venous duplex has been completed. Negative for DVT. Positive for SVT in the right cephalic vein.  Bilateral lower extremity venous duplex has been completed. Negative for DVT.  03/14/17 11:15 AM Olen CordialGreg Carlicia Leavens RVT

## 2017-03-14 NOTE — Progress Notes (Signed)
[facility].  I spoke with the patient's wife today and advised that he needs to have a tracheostomy.  I explained that this would help us minimize sedation and facilitate ventilator weaning.  She voiced understanding.  She gave consent today.  Heber CarolinaBrent McQuaid, MD [facility] Pager: [phone] Cell: [phone] After 3pm or if no response, call [phone]

## 2017-03-15 ENCOUNTER — Inpatient Hospital Stay (HOSPITAL_COMMUNITY)

## 2017-03-15 LAB — CBC WITH DIFFERENTIAL/PLATELET
BAND NEUTROPHILS: 24 %
BLASTS: 0 %
Basophils Absolute: 0 10*3/uL (ref 0.0–0.1)
Basophils Relative: 0 %
EOS ABS: 0 10*3/uL (ref 0.0–0.7)
Eosinophils Relative: 0 %
HCT: 47.7 % (ref 39.0–52.0)
HEMOGLOBIN: 15 g/dL (ref 13.0–17.0)
LYMPHS PCT: 5 %
Lymphs Abs: 0.9 10*3/uL (ref 0.7–4.0)
MCH: 29.6 pg (ref 26.0–34.0)
MCHC: 31.4 g/dL (ref 30.0–36.0)
MCV: 94.3 fL (ref 78.0–100.0)
METAMYELOCYTES PCT: 0 %
Monocytes Absolute: 0.9 10*3/uL (ref 0.1–1.0)
Monocytes Relative: 5 %
Myelocytes: 0 %
Neutro Abs: 15.4 10*3/uL — ABNORMAL HIGH (ref 1.7–7.7)
Neutrophils Relative %: 66 %
Other: 0 %
PROMYELOCYTES ABS: 0 %
Platelets: 236 10*3/uL (ref 150–400)
RBC: 5.06 MIL/uL (ref 4.22–5.81)
RDW: 16.6 % — ABNORMAL HIGH (ref 11.5–15.5)
WBC MORPHOLOGY: INCREASED
WBC: 17.2 10*3/uL — ABNORMAL HIGH (ref 4.0–10.5)
nRBC: 0 /100 WBC

## 2017-03-15 LAB — BASIC METABOLIC PANEL
ANION GAP: 7 (ref 5–15)
BUN: 55 mg/dL — AB (ref 6–20)
CALCIUM: 8.9 mg/dL (ref 8.9–10.3)
CO2: 27 mmol/L (ref 22–32)
Chloride: 121 mmol/L — ABNORMAL HIGH (ref 101–111)
Creatinine, Ser: 2.54 mg/dL — ABNORMAL HIGH (ref 0.61–1.24)
GFR calc Af Amer: 30 mL/min — ABNORMAL LOW (ref >60)
GFR, EST NON AFRICAN AMERICAN: 26 mL/min — AB (ref >60)
GLUCOSE: 164 mg/dL — AB (ref 65–99)
Potassium: 4.3 mmol/L (ref 3.5–5.1)
SODIUM: 155 mmol/L — AB (ref 135–145)

## 2017-03-15 LAB — GLUCOSE, CAPILLARY
GLUCOSE-CAPILLARY: 146 mg/dL — AB (ref 65–99)
GLUCOSE-CAPILLARY: 165 mg/dL — AB (ref 65–99)
GLUCOSE-CAPILLARY: 214 mg/dL — AB (ref 65–99)
GLUCOSE-CAPILLARY: 249 mg/dL — AB (ref 65–99)
Glucose-Capillary: 188 mg/dL — ABNORMAL HIGH (ref 65–99)
Glucose-Capillary: 123 mg/dL — ABNORMAL HIGH (ref 65–99)
Glucose-Capillary: 178 mg/dL — ABNORMAL HIGH (ref 65–99)

## 2017-03-15 LAB — PATHOLOGIST SMEAR REVIEW

## 2017-03-15 NOTE — Progress Notes (Signed)
Nutrition Follow-up  DOCUMENTATION CODES:   Obesity unspecified  INTERVENTION:   Resume:  Vital High Protein @ 55 ml/hr (1320 ml/day) 30 ml Prostat daily Provides: 1420 kcal, 130 grams protein, and 1103 ml free water.  Total free water: 2903 ml  NUTRITION DIAGNOSIS:   Inadequate oral intake related to inability to eat as evidenced by NPO status. Ongoing.   GOAL:   Provide needs based on ASPEN/SCCM guidelines Progressing.   MONITOR:   Vent status, TF tolerance, Weight trends, Labs  ASSESSMENT:   61 y.o. male with medical history significant of diabetes mellitus type 2 and hypertension who presents with altered mentation. For last 48 hours patient has been developing progressive confusion and disorientation, to the point where he was noted to have dis-balance, falls and hallucinations. His confusion is moderate to severe in intensity, it was associated with slurred speech and right upper extremity weakness  11/4 trach placed, remains on vent support 11/5 Cortrak tube placed, will resume TF  Pt discussed during ICU rounds and with RN.  Medications reviewed and include: decadron, lantus 22 units daily, novolog, senokot Free water 300 ml every 4 hours  Labs reviewed: Na 155 (H) CBG's: [phone] Weight up from 238 lb to 250 lb, pt also positive 11 L since admission  Diet Order:  Diet NPO time specified  EDUCATION NEEDS:   Not appropriate for education at this time  Skin:  Skin Assessment: Skin Integrity Issues: Skin Integrity Issues:: Incisions, Other (Comment) Incisions: head Other: non-pressure wound to R leg, skin peeling  Last BM:  10/31  Height:   Ht Readings from Last 1 Encounters:  03/10/17 5\' 10"  (1.778 m)    Weight:   Wt Readings from Last 1 Encounters:  03/15/17 250 lb 3.6 oz (113.5 kg)    Ideal Body Weight:  64.54 kg  BMI:  Body mass index is 35.9 kg/m.  Estimated Nutritional Needs:   Kcal:  1211-1541 (11-14 kcal/kg)  Protein:  129  grams (2 grams/kg)  Fluid:  >/= 1.5 L/day    Kendell BaneHeather Nala Kachel RD, LDN, CNSC [phone] Pager [phone] After Hours Pager

## 2017-03-15 NOTE — Progress Notes (Signed)
Patient ID: Timothy Jordan, male   DOB: [birth year], 61 y.o.   MRN: [id number] Subjective:  The patient is without change.  Objective: Vital signs in last 24 hours: Temp:  [98 F (36.7 C)-100.5 F (38.1 C)] 98.4 F (36.9 C) (11/05 0800) Pulse Rate:  [73-99] 93 (11/05 0800) Resp:  [12-24] 16 (11/05 0800) BP: (96-188)/(56-97) 158/81 (11/05 0800) SpO2:  [91 %-99 %] 94 % (11/05 0800) FiO2 (%):  [40 %-100 %] 40 % (11/05 0720) Weight:  [113.5 kg (250 lb 3.6 oz)-114 kg (251 lb 5.2 oz)] 113.5 kg (250 lb 3.6 oz) (11/05 0500)  Intake/Output from previous day: 11/04 0701 - 11/05 0700 In: 1078 [I.V.:295.5; NG/GT:165; IV Piggyback:617.5] Out: 2175 [Urine:2175] Intake/Output this shift: Total I/O In: 120 [I.V.:12.5; IV Piggyback:107.5] Out: -   Physical exam Glasgow Coma Scale [id number]. He is right hemiplegic. He localizes on the left.  Lab Results: Recent Labs    03/14/17 0449 03/15/17 0441  WBC 12.5* 17.2*  HGB 14.2 15.0  HCT 46.4 47.7  PLT 215 236   BMET Recent Labs    03/14/17 0449 03/15/17 0441  NA 152* 155*  K 4.2 4.3  CL 117* 121*  CO2 28 27  GLUCOSE 236* 164*  BUN 47* 55*  CREATININE 2.57* 2.54*  CALCIUM 8.6* 8.9    Studies/Results: Dg Chest Port 1 View  Result Date: 03/15/2017 CLINICAL DATA:  Worsening breath sounds on the left EXAM: PORTABLE CHEST 1 VIEW COMPARISON:  Yesterday FINDINGS: Tracheostomy tube in place. Central line on the right with tip at the upper cavoatrial junction. Diffuse interstitial opacity without smooth thin Kerley lines. There is superimposed streaky density at the bases. Lung volumes are low. Aeration has actually improved over the left diaphragm where there is less dense opacity and better visualized diaphragmatic contour. No significant effusion. No pneumothorax. IMPRESSION: 1. Atelectasis or pneumonia at the bases. Mild improvement on the left when compared yesterday - the diaphragm is better visualized. 2. Diffuse interstitial opacity not  convincing for edema. 3. Apparent cardiomegaly accentuated by low volumes. Electronically Signed   By: Marnee Spring M.D.   On: 03/15/2017 08:17   Dg Chest Port 1 View  Result Date: 03/14/2017 CLINICAL DATA:  Tracheostomy tube placed. EXAM: PORTABLE CHEST 1 VIEW COMPARISON:  Earlier today. FINDINGS: The endotracheal tube has been replaced by a tracheostomy tube in satisfactory position. The right jugular catheter tip is in the upper right atrium near the superior cavoatrial junction. Stable enlarged cardiac silhouette and diffusely prominent interstitial markings. Increased density of both lung bases. Thoracic spine degenerative changes. IMPRESSION: 1. Tracheostomy tube in satisfactory position. 2. Increased bibasilar atelectasis and possible pneumonia. 3. Stable interstitial lung disease. Electronically Signed   By: Beckie Salts M.D.   On: 03/14/2017 13:35   Dg Chest Port 1 View  Result Date: 03/14/2017 CLINICAL DATA:  61 year old male with brain abscesses. Respiratory failure. EXAM: PORTABLE CHEST 1 VIEW COMPARISON:  03/13/2017 and earlier. FINDINGS: Portable AP semi upright view at 0616 hours. Stable endotracheal tube. Enteric tube is looped at the level of the stomach. Stable right IJ central line. Borderline to mild cardiomegaly. Other mediastinal contours are within normal limits. No pneumothorax. Continued diffuse bilateral pulmonary interstitial opacity with mildly confluent opacity at both lung bases. However, basilar ventilation appears improved since yesterday. Small if any pleural effusions. No areas of worsening ventilation. IMPRESSION: 1.  Stable lines and tubes. 2. Continued bilateral pulmonary interstitial opacity since 03/07/2017 which could be acute (atypical infection, interstitial edema)  or chronic. 3. Interval improved lung base ventilation with patchy residual opacity. Small if any pleural effusions. Electronically Signed   By: Odessa FlemingH  Hall M.D.   On: 03/14/2017 08:13   Dg Chest Port 1  View  Result Date: 03/13/2017 CLINICAL DATA:  Initial evaluation for hypoxia. EXAM: PORTABLE CHEST 1 VIEW COMPARISON:  Prior radiograph from earlier the same day. FINDINGS: Patient is intubated with the tip of the endotracheal tube positioned 5.3 cm above the carina. Enteric tube courses in the abdomen right-sided central venous catheter is grossly similar terminating at the right atrium. Cardiomegaly grossly stable. Mediastinal silhouette grossly within normal limits allowing for patient positioning. Lungs hypoinflated. Diffuse interstitial prominence, most likely reflecting pulmonary interstitial edema, similar to previous. Elevation of the right hemidiaphragm with increased right basilar opacity, which may reflect progressive atelectasis or consolidation. Mild left basilar opacity may reflect atelectasis or infiltrate as well. No pneumothorax. Suspected small right pleural effusion. IMPRESSION: 1. Support apparatus in stable position as above. 2. Cardiomegaly with diffuse pulmonary interstitial edema, similar to previous. 3. Hypoinflation with elevation of the right hemidiaphragm, with increased right basilar opacity, which may reflect progressive atelectasis or infiltrate. Electronically Signed   By: Rise MuBenjamin  McClintock M.D.   On: 03/13/2017 21:45    Assessment/Plan: Brain abscesses: The patient is stable clinically. I'll repeat his head CT to assess the response to antibiotics.  LOS: 10 days     Udell Blasingame D 03/15/2017, 8:44 AM

## 2017-03-15 NOTE — Progress Notes (Signed)
eLink Physician-Brief Progress Note Patient Name: Timothy NuttingKelvin M Jordan DOB: 1955-11-11 MRN: [id number]   Date of Service  03/15/2017  HPI/Events of Note  Agitation - Request to renew bilateral soft wrist restraints.   eICU Interventions  Will renew bilateral soft wrist restraints.      Intervention Category Minor Interventions: Agitation / anxiety - evaluation and management  Sommer,Steven Eugene 03/15/2017, 11:32 PM

## 2017-03-15 NOTE — Progress Notes (Signed)
Cortrak Tube Team Note:  Consult received to place a Cortrak feeding tube.   A 10 F Cortrak tube was placed in the left nare and secured with a nasal bridle at 74 cm. Per the Cortrak monitor reading the tube tip is in the pyloric region.   No x-ray is required. RN may begin using tube.   If the tube becomes dislodged please keep the tube and contact the Cortrak team at [email or web address] (password [email or web address]) for replacement.  If after hours and replacement cannot be delayed, place a NG tube and confirm placement with an abdominal x-ray.   Betsey Holidayasey Lynsee Wands MS, RD, LDN Pager #- [phone] After Hours Pager: [phone]

## 2017-03-15 NOTE — Progress Notes (Signed)
PULMONARY / CRITICAL CARE MEDICINE   Name: Timothy Jordan MRN: [id number] DOB: Sep 28, 1955    ADMISSION DATE:  03/05/2017 CONSULTATION DATE: March 07, 2017  REFERRING MD: Dr. Cyndy Freeze  CHIEF COMPLAINT: Inability to protect airway, brain abscess  HISTORY OF PRESENT ILLNESS:   61 year old male admitted with a brain abscess, developed encephalopathy requiring intubation.  Pulmonary and critical care medicine consulted for ventilator management.  He underwent percutaneous tracheostomy yesterday.  Failed SBT this morning.  He has spontaneous eye opening but is not interactive with me today.  Continues to be sedated with a fentanyl infusion.   SUBJECTIVE:     VITAL SIGNS: BP (!) 158/81   Pulse 93   Temp 98.4 F (36.9 C) (Axillary)   Resp 16   Ht _0  (1.778 m) Comment: per family  Wt 250 lb 3.6 oz (113.5 kg)   SpO2 95%   BMI 35.90 kg/m   HEMODYNAMICS:    VENTILATOR SETTINGS: Vent Mode: PCV FiO2 (%):  [40 %-100 %] 40 % Set Rate:  [15 bmp-20 bmp] 15 bmp Vt Set:  [550 mL] 550 mL PEEP:  [5 cmH20-10 cmH20] 10 cmH20 Plateau Pressure:  [18 cmH20-24 cmH20] 18 cmH20  INTAKE / OUTPUT: I/O last 3 completed shifts: In: 2574.8 [I.V.:382.3; NG/GT:1425; IV Piggyback:767.5] Out: 0932 [Urine:4015]  PHYSICAL EXAMINATION:  General: Obese middle-aged male mechanically ventilated via tracheostomy  HENT: scalp wound well healed, new tracheostomy site is dry.   PULM: Unlabored on a pressure support of 15.  There is symmetric air movement few scattered rhonchi and no wheezing. CV: RRR, no mgr GI: obese BS+, soft, nontender MSK: normal bulk and tone Neuro: Has spontaneous eye opening but he is not tracking for me nor is he responding to verbal instructions.  He spontaneously moves the left side but not the right.     LABS:  BMET Recent Labs  Lab 03/13/17 0422 03/14/17 0449 03/15/17 0441  NA 149* 152* 155*  K 3.6 4.2 4.3  CL 115* 117* 121*  CO2 _1 BUN 45* 47* 55*   CREATININE 2.54* 2.57* 2.54*  GLUCOSE 262* 236* 164*    Electrolytes Recent Labs  Lab 03/10/17 0242 03/11/17 0803  03/13/17 0422 03/14/17 0449 03/15/17 0441  CALCIUM 8.2* 8.6*   < > 8.3* 8.6* 8.9  MG 2.2 2.5*  --  2.5*  --   --   PHOS  --  3.7  --   --   --   --    < > = values in this interval not displayed.    CBC Recent Labs  Lab 03/13/17 0422 03/14/17 0449 03/15/17 0441  WBC 12.6* 12.5* 17.2*  HGB 14.6 14.2 15.0  HCT 47.4 46.4 47.7  PLT 200 215 236    Coag's Recent Labs  Lab 03/14/17 0923  INR 1.38    Sepsis Markers Recent Labs  Lab 03/09/17 0421 03/10/17 0242  PROCALCITON 1.89 1.64    ABG Recent Labs  Lab 03/11/17 0420 03/12/17 0321 03/13/17 0352  PHART 7.375 7.327* 7.387  PCO2ART 47.1 54.8* 46.8  PO2ART 61.7* 70.0* 68.6*    Liver Enzymes Recent Labs  Lab 03/11/17 0803  AST 33  ALT 16*  ALKPHOS 82  BILITOT 0.4  ALBUMIN 2.1*    Cardiac Enzymes No results for input(s): TROPONINI, PROBNP in the last 168 hours.  Glucose Recent Labs  Lab 03/14/17 1147 03/14/17 1534 03/14/17 2011 03/15/17 0046 03/15/17 0448 03/15/17 0807  GLUCAP 166* 192* 235* 249* 165* 123*  Imaging Dg Chest Port 1 View  Result Date: 03/15/2017 CLINICAL DATA:  Worsening breath sounds on the left EXAM: PORTABLE CHEST 1 VIEW COMPARISON:  Yesterday FINDINGS: Tracheostomy tube in place. Central line on the right with tip at the upper cavoatrial junction. Diffuse interstitial opacity without smooth thin Kerley lines. There is superimposed streaky density at the bases. Lung volumes are low. Aeration has actually improved over the left diaphragm where there is less dense opacity and better visualized diaphragmatic contour. No significant effusion. No pneumothorax. IMPRESSION: 1. Atelectasis or pneumonia at the bases. Mild improvement on the left when compared yesterday - the diaphragm is better visualized. 2. Diffuse interstitial opacity not convincing for edema. 3.  Apparent cardiomegaly accentuated by low volumes. Electronically Signed   By: Monte Fantasia M.D.   On: 03/15/2017 08:17   Dg Chest Port 1 View  Result Date: 03/14/2017 CLINICAL DATA:  Tracheostomy tube placed. EXAM: PORTABLE CHEST 1 VIEW COMPARISON:  Earlier today. FINDINGS: The endotracheal tube has been replaced by a tracheostomy tube in satisfactory position. The right jugular catheter tip is in the upper right atrium near the superior cavoatrial junction. Stable enlarged cardiac silhouette and diffusely prominent interstitial markings. Increased density of both lung bases. Thoracic spine degenerative changes. IMPRESSION: 1. Tracheostomy tube in satisfactory position. 2. Increased bibasilar atelectasis and possible pneumonia. 3. Stable interstitial lung disease. Electronically Signed   By: Claudie Revering M.D.   On: 03/14/2017 13:35     STUDIES:  ECHO 10/27 >> study stopped prematurely due to pt movement, mild LVH, LVEF 55-60% CT Head 10/28 >> Stable to slight enlargement of left basal ganglia masses compatible with abscesses on MRI. Worsening surrounding edema with 5 mm of rightward midline shift Renal US 10/29 >> complex left-sided renal cysts, no obstructive uropathy, trace perinephric fluid is seen about both kidneys   CULTURES: BCx2 10/26 >> negative Sputum 10/28 >> normal flora   UA 10/26 >> negative HIV 10/26 >> negative  BCx2 10/28 >> GPC pairs 1/2 Brain Abscess 10/29 >> few GPC BAL 11/1 >> rare yeast  ANTIBIOTICS: Ceftriaxone 10/26 >> Metronidazole 10/26 >> Vancomycin 10/26 >> 10/27  SIGNIFICANT EVENTS: 10/28  Progressive decline in MS requiring intubation 10/29  Fever and shock overnight, requiring addition of phenylephrine gtt.  Brain abscess aspiration 10/30  Off vasopressors 10/31  No weaning, no change in neuro exam 11/02  Increased O2 needs overnight, resolved with increased PEEP  LINES/TUBES: ETT 10/28 >>  PICC 11/1 (IR) >>   DISCUSSION: 61 year old male  with diabetes admitted with a brain abscess.    ASSESSMENT / PLAN:  NEUROLOGIC A:   Brain abscess Acute encephalopathy Status post brain abscess drainage P: On high-dose Rocephin and Flagyl for his brain abscess.  Strep Viridans was isolated from the brain abscess.  Follow-up CT will be obtained later today.  I'm going to convert his sedation from fentanyl to Precedex in the hopes of seeing some better neurologic function.    PULMONARY A: Acute respiratory failure with hypoxemia Obesity, atelectasis P:   I am altering his sedation regimen as noted hoping we can facilitate separation from mechanical ventilation.  Perhaps a subtle infiltrate on the chest x-ray however BAL results of 11 1 are showing only normal respiratory flora and I do not feel compelled to change his antibiotic coverage.  He likely has some atelectasis related to his body habitus and I am going to continue to ventilate him with an elevated PEEP.   HEMATOLOGIC A:   No  acute issues P:  Continue Lovenox for DVT prophylaxis Trend CBC, monitor for bleeding   INFECTIOUS A:   Brain abscess P:   Antibiotics per infectious disease Follow-up culture   ENDOCRINE A:   Diabetes type 2 P:   Sliding scale insulin    FAMILY  Situation explained to wiife this morning     My cc time 32 minutes  Lars Masson, MD [facility] Pager: [phone] Cell: [phone] After 3pm or if no response, call [phone]   03/15/2017, 10:50 AM

## 2017-03-15 NOTE — Progress Notes (Signed)
[facility]   Reason for visit: Follow up on brain abscess  Interval History: patient seen earlier today; had CT of head as follow up and improved mass effect but concern for perforator infarct.  WBC 17 on decadron; afebrile; culture with Strep viridans   Physical Exam: Constitutional:  Vitals:   03/15/17 1300 03/15/17 1400  BP: (!) 175/83 (!) 160/90  Pulse: 96 99  Resp: 18 17  Temp:    SpO2: 93% 94%   patient does not respond Eyes: anicteric HENT: +ET Respiratory: on vent Cardiovascular: RRR GI: soft, nt, nd  Review of Systems: Unable to be assessed due to mental status  Lab Results  Component Value Date   WBC 17.2 (H) 03/15/2017   HGB 15.0 03/15/2017   HCT 47.7 03/15/2017   MCV 94.3 03/15/2017   PLT 236 03/15/2017    Lab Results  Component Value Date   CREATININE 2.54 (H) 03/15/2017   BUN 55 (H) 03/15/2017   NA 155 (H) 03/15/2017   K 4.3 03/15/2017   CL 121 (H) 03/15/2017   CO2 27 03/15/2017    Lab Results  Component Value Date   ALT 16 (L) 03/11/2017   AST 33 03/11/2017   ALKPHOS 82 03/11/2017     Microbiology: Recent Results (from the past 240 hour(s))  MRSA PCR Screening     Status: None   Collection Time: 03/07/17 10:45 AM  Result Value Ref Range Status   MRSA by PCR NEGATIVE NEGATIVE Final    Comment:        The GeneXpert MRSA Assay (FDA approved for NASAL specimens only), is one component of a comprehensive MRSA colonization surveillance program. It is not intended to diagnose MRSA infection nor to guide or monitor treatment for MRSA infections.   Culture, respiratory (tracheal aspirate)     Status: None   Collection Time: 03/07/17 10:54 AM  Result Value Ref Range Status   Specimen Description TRACHEAL ASPIRATE  Final   Special Requests Normal  Final   Gram Stain   Final    RARE WBC PRESENT, PREDOMINANTLY PMN RARE GRAM POSITIVE COCCI IN PAIRS RARE GRAM NEGATIVE RODS    Culture Consistent with normal  respiratory flora.  Final   Report Status 03/09/2017 FINAL  Final  Culture, blood (routine x 2)     Status: None   Collection Time: 03/07/17 12:52 PM  Result Value Ref Range Status   Specimen Description BLOOD RIGHT ANTECUBITAL  Final   Special Requests IN PEDIATRIC BOTTLE Blood Culture adequate volume  Final   Culture NO GROWTH 5 DAYS  Final   Report Status 03/12/2017 FINAL  Final  Culture, blood (routine x 2)     Status: None   Collection Time: 03/07/17 12:58 PM  Result Value Ref Range Status   Specimen Description BLOOD BLOOD RIGHT FOREARM  Final   Special Requests IN PEDIATRIC BOTTLE Blood Culture adequate volume  Final   Culture NO GROWTH 5 DAYS  Final   Report Status 03/12/2017 FINAL  Final  Surgical pcr screen     Status: None   Collection Time: 03/08/17 11:05 AM  Result Value Ref Range Status   MRSA, PCR NEGATIVE NEGATIVE Final   Staphylococcus aureus NEGATIVE NEGATIVE Final    Comment: (NOTE) The Xpert SA Assay (FDA approved for NASAL specimens in patients 78 years of age and older), is one component of a comprehensive surveillance program. It is not intended to diagnose infection nor to guide or  monitor treatment.   Aerobic/Anaerobic Culture (surgical/deep wound)     Status: None   Collection Time: 03/08/17  3:49 PM  Result Value Ref Range Status   Specimen Description ABSCESS  Final   Special Requests BRAIN  Final   Gram Stain   Final    ABUNDANT WBC PRESENT,BOTH PMN AND MONONUCLEAR MODERATE GRAM POSITIVE COCCI IN PAIRS    Culture   Final    FEW VIRIDANS STREPTOCOCCUS NO ANAEROBES ISOLATED    Report Status 03/14/2017 FINAL  Final   Organism ID, Bacteria VIRIDANS STREPTOCOCCUS  Final      Susceptibility   Viridans streptococcus - MIC*    PENICILLIN INTERMEDIATE Intermediate     CEFTRIAXONE 1 SENSITIVE Sensitive     ERYTHROMYCIN <=0.12 SENSITIVE Sensitive     LEVOFLOXACIN <=0.25 SENSITIVE Sensitive     VANCOMYCIN 0.5 SENSITIVE Sensitive     * FEW VIRIDANS  STREPTOCOCCUS  Culture, respiratory (NON-Expectorated)     Status: None   Collection Time: 03/11/17  2:00 PM  Result Value Ref Range Status   Specimen Description TRACHEAL ASPIRATE  Final   Special Requests NONE  Final   Gram Stain   Final    FEW WBC PRESENT,BOTH PMN AND MONONUCLEAR RARE SQUAMOUS EPITHELIAL CELLS PRESENT RARE GRAM POSITIVE COCCI IN PAIRS    Culture RARE CANDIDA ALBICANS  Final   Report Status 03/14/2017 FINAL  Final  Culture, bal-quantitative     Status: Abnormal   Collection Time: 03/11/17  4:29 PM  Result Value Ref Range Status   Specimen Description BRONCHIAL ALVEOLAR LAVAGE  Final   Special Requests NONE  Final   Gram Stain   Final    ABUNDANT WBC PRESENT, PREDOMINANTLY MONONUCLEAR NO ORGANISMS SEEN    Culture (A)  Final    40,000 COLONIES/mL Consistent with normal respiratory flora.   Report Status 03/14/2017 FINAL  Final    Impression/Plan:  1. Brain abscess - culture noted and will stop flagyl.    2.  Edema - on decadron.  3.  Medication monitoring - checking CBC on ceftriaxone.

## 2017-03-15 NOTE — Progress Notes (Signed)
Pt transported to and from 4N28 to CT 2 on ventilator. Pt stable throughout with no complications. VS within normal limits. RT will continue to monitor.

## 2017-03-16 LAB — COMPREHENSIVE METABOLIC PANEL
ALT: 21 U/L (ref 17–63)
ANION GAP: 7 (ref 5–15)
AST: 30 U/L (ref 15–41)
Albumin: 1.8 g/dL — ABNORMAL LOW (ref 3.5–5.0)
Alkaline Phosphatase: 106 U/L (ref 38–126)
BILIRUBIN TOTAL: 0.3 mg/dL (ref 0.3–1.2)
BUN: 66 mg/dL — ABNORMAL HIGH (ref 6–20)
CALCIUM: 8.8 mg/dL — AB (ref 8.9–10.3)
CO2: 27 mmol/L (ref 22–32)
Chloride: 119 mmol/L — ABNORMAL HIGH (ref 101–111)
Creatinine, Ser: 2.57 mg/dL — ABNORMAL HIGH (ref 0.61–1.24)
GFR calc non Af Amer: 26 mL/min — ABNORMAL LOW (ref >60)
GFR, EST AFRICAN AMERICAN: 30 mL/min — AB (ref >60)
GLUCOSE: 322 mg/dL — AB (ref 65–99)
POTASSIUM: 4.4 mmol/L (ref 3.5–5.1)
Sodium: 153 mmol/L — ABNORMAL HIGH (ref 135–145)
TOTAL PROTEIN: 6.6 g/dL (ref 6.5–8.1)

## 2017-03-16 LAB — CBC WITH DIFFERENTIAL/PLATELET
BASOS ABS: 0 10*3/uL (ref 0.0–0.1)
Basophils Relative: 0 %
EOS ABS: 0 10*3/uL (ref 0.0–0.7)
Eosinophils Relative: 0 %
HEMATOCRIT: 48.1 % (ref 39.0–52.0)
HEMOGLOBIN: 15.1 g/dL (ref 13.0–17.0)
LYMPHS PCT: 11 %
Lymphs Abs: 2.3 10*3/uL (ref 0.7–4.0)
MCH: 29.4 pg (ref 26.0–34.0)
MCHC: 31.4 g/dL (ref 30.0–36.0)
MCV: 93.6 fL (ref 78.0–100.0)
MONOS PCT: 2 %
Monocytes Absolute: 0.4 10*3/uL (ref 0.1–1.0)
NEUTROS ABS: 18.2 10*3/uL — AB (ref 1.7–7.7)
NEUTROS PCT: 87 %
Platelets: 255 10*3/uL (ref 150–400)
RBC: 5.14 MIL/uL (ref 4.22–5.81)
RDW: 16.1 % — ABNORMAL HIGH (ref 11.5–15.5)
WBC: 20.9 10*3/uL — ABNORMAL HIGH (ref 4.0–10.5)

## 2017-03-16 LAB — GLUCOSE, CAPILLARY
GLUCOSE-CAPILLARY: 286 mg/dL — AB (ref 65–99)
GLUCOSE-CAPILLARY: 290 mg/dL — AB (ref 65–99)
GLUCOSE-CAPILLARY: 311 mg/dL — AB (ref 65–99)
GLUCOSE-CAPILLARY: 336 mg/dL — AB (ref 65–99)
Glucose-Capillary: 280 mg/dL — ABNORMAL HIGH (ref 65–99)
Glucose-Capillary: 298 mg/dL — ABNORMAL HIGH (ref 65–99)
Glucose-Capillary: 328 mg/dL — ABNORMAL HIGH (ref 65–99)

## 2017-03-16 LAB — TROPONIN I
TROPONIN I: 1.83 ng/mL — AB (ref <0.03)
TROPONIN I: 1.79 ng/mL — AB (ref <0.03)
TROPONIN I: 1.65 ng/mL — AB (ref <0.03)

## 2017-03-16 LAB — PHOSPHORUS: PHOSPHORUS: 3.2 mg/dL (ref 2.5–4.6)

## 2017-03-16 LAB — MAGNESIUM: MAGNESIUM: 2.8 mg/dL — AB (ref 1.7–2.4)

## 2017-03-16 MED ORDER — AMIODARONE HCL IN DEXTROSE 360-4.14 MG/200ML-% IV SOLN
60.0000 mg/h | INTRAVENOUS | Status: AC
  Administered 2017-03-16: 60 mg/h via INTRAVENOUS
  Filled 2017-03-16: qty 200

## 2017-03-16 MED ORDER — ATORVASTATIN CALCIUM 40 MG PO TABS
40.0000 mg | ORAL_TABLET | Freq: Every day | ORAL | Status: DC
  Administered 2017-03-16 – 2017-03-20 (×5): 40 mg via ORAL
  Filled 2017-03-16 (×5): qty 1

## 2017-03-16 MED ORDER — METOPROLOL TARTRATE 5 MG/5ML IV SOLN
2.5000 mg | Freq: Four times a day (QID) | INTRAVENOUS | Status: DC
  Administered 2017-03-16 – 2017-03-22 (×25): 2.5 mg via INTRAVENOUS
  Filled 2017-03-16 (×26): qty 5

## 2017-03-16 MED ORDER — AMIODARONE LOAD VIA INFUSION
150.0000 mg | Freq: Once | INTRAVENOUS | Status: AC
  Administered 2017-03-16: 150 mg via INTRAVENOUS
  Filled 2017-03-16: qty 83.34

## 2017-03-16 MED ORDER — AMIODARONE HCL IN DEXTROSE 360-4.14 MG/200ML-% IV SOLN
30.0000 mg/h | INTRAVENOUS | Status: DC
  Administered 2017-03-16 – 2017-03-18 (×4): 30 mg/h via INTRAVENOUS
  Filled 2017-03-16 (×5): qty 200

## 2017-03-16 NOTE — Progress Notes (Signed)
Patient ID: Timothy Jordan, male   DOB: 20-Jan-1956, 61 y.o.   MRN: [id number] Subjective:  The patient is alert and attentive. He is in no apparent distress.  Objective: Vital signs in last 24 hours: Temp:  [97.8 F (36.6 C)-98.6 F (37 C)] 98.3 F (36.8 C) (11/06 0400) Pulse Rate:  [84-103] 98 (11/06 0722) Resp:  [15-41] 41 (11/06 0722) BP: (105-175)/(56-133) 152/81 (11/06 0722) SpO2:  [93 %-100 %] 94 % (11/06 0722) FiO2 (%):  [40 %] 40 % (11/06 0722) Weight:  [113.2 kg (249 lb 9 oz)] 113.2 kg (249 lb 9 oz) (11/06 0500)  Intake/Output from previous day: 11/05 0701 - 11/06 0700 In: 2288.2 [I.V.:430.5; NG/GT:1500.2; IV Piggyback:357.5] Out: 2200 [Urine:2200] Intake/Output this shift: No intake/output data recorded.  Physical exam the patient localizes on the left. He tracks and is attentive. He doesn't quite follow commands. His right hemiplegic.  I reviewed the patient's follow-up head CT performed yesterday. It looks slightly better.  Lab Results: Recent Labs    03/15/17 0441 03/16/17 0334  WBC 17.2* 20.9*  HGB 15.0 15.1  HCT 47.7 48.1  PLT 236 255   BMET Recent Labs    03/15/17 0441 03/16/17 0334  NA 155* 153*  K 4.3 4.4  CL 121* 119*  CO2 27 27  GLUCOSE 164* 322*  BUN 55* 66*  CREATININE 2.54* 2.57*  CALCIUM 8.9 8.8*    Studies/Results: Ct Head Wo Contrast  Result Date: 03/15/2017 CLINICAL DATA:  Cerebral hemorrhage suspected. History per electronic medical record is brain abscess on antibiotics. Repeat head CT to assess response antibiotics. EXAM: CT HEAD WITHOUT CONTRAST TECHNIQUE: Contiguous axial images were obtained from the base of the skull through the vertex without intravenous contrast. COMPARISON:  03/07/2017 FINDINGS: Brain: Low-density spaning the posterior putamen, posterior corona radiata, and caudate body. The posterior and inferior aspect of this low-density is likely the abscess measured previously. Measurement today is not possible without  contrast. More superiorly could be vasogenic edema or a perforator infarct. There is still 3 mm of midline shift, but swelling is improved and there is less effacement of the left lateral ventricle. There is low-density throughout the bilateral cerebral white matter that is chronic based on MRI. No hemorrhage or hydrocephalus. No gross ventricular debris. Vascular: No hyperdense vessel. Skull: Burr hole for aspiration. Sinuses/Orbits: Patchy opacification of posterior sinuses in this intubated patient. IMPRESSION: Known cerebral abscesses that are not well-defined without contrast. Swelling/mass effect is improved but there is a more well-defined wedge of low-density extending superiorly from the level of abscess, suspect interval perforator infarct. No hydrocephalus. Electronically Signed   By: Marnee SpringJonathon  Watts M.D.   On: 03/15/2017 13:11   Dg Chest Port 1 View  Result Date: 03/15/2017 CLINICAL DATA:  Worsening breath sounds on the left EXAM: PORTABLE CHEST 1 VIEW COMPARISON:  Yesterday FINDINGS: Tracheostomy tube in place. Central line on the right with tip at the upper cavoatrial junction. Diffuse interstitial opacity without smooth thin Kerley lines. There is superimposed streaky density at the bases. Lung volumes are low. Aeration has actually improved over the left diaphragm where there is less dense opacity and better visualized diaphragmatic contour. No significant effusion. No pneumothorax. IMPRESSION: 1. Atelectasis or pneumonia at the bases. Mild improvement on the left when compared yesterday - the diaphragm is better visualized. 2. Diffuse interstitial opacity not convincing for edema. 3. Apparent cardiomegaly accentuated by low volumes. Electronically Signed   By: Marnee SpringJonathon  Watts M.D.   On: 03/15/2017 08:17  Dg Chest Port 1 View  Result Date: 03/14/2017 CLINICAL DATA:  Tracheostomy tube placed. EXAM: PORTABLE CHEST 1 VIEW COMPARISON:  Earlier today. FINDINGS: The endotracheal tube has been  replaced by a tracheostomy tube in satisfactory position. The right jugular catheter tip is in the upper right atrium near the superior cavoatrial junction. Stable enlarged cardiac silhouette and diffusely prominent interstitial markings. Increased density of both lung bases. Thoracic spine degenerative changes. IMPRESSION: 1. Tracheostomy tube in satisfactory position. 2. Increased bibasilar atelectasis and possible pneumonia. 3. Stable interstitial lung disease. Electronically Signed   By: Beckie SaltsSteven  Reid M.D.   On: 03/14/2017 13:35    Assessment/Plan: Brain abscesses: The patient scan is stable. He has improved slightly neurologically. We will continue antibiotics.  LOS: 11 days     Jerelle Virden D 03/16/2017, 7:35 AM

## 2017-03-16 NOTE — Progress Notes (Signed)
PULMONARY / CRITICAL CARE MEDICINE   Name: Timothy Jordan MRN: [id number] DOB: September 05, 1955    ADMISSION DATE:  03/05/2017 CONSULTATION DATE: March 07, 2017  REFERRING MD: Dr. Cyndy Freeze  CHIEF COMPLAINT: Inability to protect airway, brain abscess  HISTORY OF PRESENT ILLNESS:   61 year old male admitted with a brain abscess, developed encephalopathy requiring intubation.  Pulmonary and critical care medicine consulted for ventilator management.  He underwent percutaneous tracheostomy 11/5. Early this morning he had an episode of SVT. He was started on amiodarone. EKG does not show ischemic changes to me, but his first troponin is elevated at 1.8. He is still supported on mechanical ventilation. He has spontaneous eye opening, but does not interact with me at all.   SUBJECTIVE:   unobtainable  VITAL SIGNS: BP (!) 152/81   Pulse 98   Temp 98.3 F (36.8 C) (Axillary)   Resp (!) 41   Ht _0  (1.778 m) Comment: per family  Wt 249 lb 9 oz (113.2 kg)   SpO2 94%   BMI 35.81 kg/m   HEMODYNAMICS:    VENTILATOR SETTINGS: Vent Mode: PCV FiO2 (%):  [40 %] 40 % Set Rate:  [15 bmp] 15 bmp PEEP:  [10 cmH20] 10 cmH20 Plateau Pressure:  [18 cmH20-21 cmH20] 21 cmH20  INTAKE / OUTPUT: I/O last 3 completed shifts: In: 2888.2 [I.V.:573; NG/GT:1500.2; IV Piggyback:815] Out: 13 [Urine:3200]  PHYSICAL EXAMINATION:  General: Obese middle-aged male mechanically ventilated via tracheostomy  HENT: scalp wound well healed, new tracheostomy site is dry.   PULM: Unlabored on a pressure support of 10.  There is symmetric air movement few scattered rhonchi and no wheezing. CV: RRR, positive S4, no murmur or rub GI: obese BS+, soft, nontender MSK: normal bulk and tone Neuro: Has spontaneous eye opening but he is not tracking for me nor is he responding to verbal instructions.  He spontaneously moves the left side but not the right.     LABS:  BMET Recent Labs  Lab 03/14/17 0449  03/15/17 0441 03/16/17 0334  NA 152* 155* 153*  K 4.2 4.3 4.4  CL 117* 121* 119*  CO2 _1 BUN 47* 55* 66*  CREATININE 2.57* 2.54* 2.57*  GLUCOSE 236* 164* 322*    Electrolytes Recent Labs  Lab 03/11/17 0803  03/13/17 0422 03/14/17 0449 03/15/17 0441 03/16/17 0334  CALCIUM 8.6*   < > 8.3* 8.6* 8.9 8.8*  MG 2.5*  --  2.5*  --   --  2.8*  PHOS 3.7  --   --   --   --  3.2   < > = values in this interval not displayed.    CBC Recent Labs  Lab 03/14/17 0449 03/15/17 0441 03/16/17 0334  WBC 12.5* 17.2* 20.9*  HGB 14.2 15.0 15.1  HCT 46.4 47.7 48.1  PLT 215 236 255    Coag's Recent Labs  Lab 03/14/17 0923  INR 1.38    Sepsis Markers Recent Labs  Lab 03/10/17 0242  PROCALCITON 1.64    ABG Recent Labs  Lab 03/11/17 0420 03/12/17 0321 03/13/17 0352  PHART 7.375 7.327* 7.387  PCO2ART 47.1 54.8* 46.8  PO2ART 61.7* 70.0* 68.6*    Liver Enzymes Recent Labs  Lab 03/11/17 0803 03/16/17 0334  AST 33 30  ALT 16* 21  ALKPHOS 82 106  BILITOT 0.4 0.3  ALBUMIN 2.1* 1.8*    Cardiac Enzymes Recent Labs  Lab 03/16/17 0544  TROPONINI 1.83*    Glucose Recent Labs  Lab  03/15/17 0807 03/15/17 1217 03/15/17 1602 03/15/17 1952 03/15/17 2338 03/16/17 0352  GLUCAP 123* 146* 178* 214* 311* 286*    Imaging Ct Head Wo Contrast  Result Date: 03/15/2017 CLINICAL DATA:  Cerebral hemorrhage suspected. History per electronic medical record is brain abscess on antibiotics. Repeat head CT to assess response antibiotics. EXAM: CT HEAD WITHOUT CONTRAST TECHNIQUE: Contiguous axial images were obtained from the base of the skull through the vertex without intravenous contrast. COMPARISON:  03/07/2017 FINDINGS: Brain: Low-density spaning the posterior putamen, posterior corona radiata, and caudate body. The posterior and inferior aspect of this low-density is likely the abscess measured previously. Measurement today is not possible without contrast. More  superiorly could be vasogenic edema or a perforator infarct. There is still 3 mm of midline shift, but swelling is improved and there is less effacement of the left lateral ventricle. There is low-density throughout the bilateral cerebral white matter that is chronic based on MRI. No hemorrhage or hydrocephalus. No gross ventricular debris. Vascular: No hyperdense vessel. Skull: Burr hole for aspiration. Sinuses/Orbits: Patchy opacification of posterior sinuses in this intubated patient. IMPRESSION: Known cerebral abscesses that are not well-defined without contrast. Swelling/mass effect is improved but there is a more well-defined wedge of low-density extending superiorly from the level of abscess, suspect interval perforator infarct. No hydrocephalus. Electronically Signed   By: Monte Fantasia M.D.   On: 03/15/2017 13:11     STUDIES:  ECHO 10/27 >> study stopped prematurely due to pt movement, mild LVH, LVEF 55-60% CT Head 10/28 >> Stable to slight enlargement of left basal ganglia masses compatible with abscesses on MRI. Worsening surrounding edema with 5 mm of rightward midline shift Renal US 10/29 >> complex left-sided renal cysts, no obstructive uropathy, trace perinephric fluid is seen about both kidneys   CULTURES: BCx2 10/26 >> negative Sputum 10/28 >> normal flora   UA 10/26 >> negative HIV 10/26 >> negative  BCx2 10/28 >> GPC pairs 1/2 Brain Abscess 10/29 >> few GPC BAL 11/1 >> rare yeast  ANTIBIOTICS: Ceftriaxone 10/26 >> Metronidazole 10/26 >> Vancomycin 10/26 >> 10/27  SIGNIFICANT EVENTS: 10/28  Progressive decline in MS requiring intubation 10/29  Fever and shock overnight, requiring addition of phenylephrine gtt.  Brain abscess aspiration 10/30  Off vasopressors 10/31  No weaning, no change in neuro exam 11/02  Increased O2 needs overnight, resolved with increased PEEP  LINES/TUBES: ETT 10/28 >>  PICC 11/1 (IR) >>   DISCUSSION: 61 year old male with diabetes  admitted with a brain abscess. Persistent respiratory failure and new SVT with elevated troponin this morning.   ASSESSMENT / PLAN:  NEUROLOGIC A:   Brain abscess Acute encephalopathy Status post brain abscess drainage P: On high-dose Rocephin and Flagyl for his brain abscess.  Strep Viridans was isolated from the brain abscess.  Follow-up CT will be obtained yesterday, shows perhaps a small area of infarct and smaller abscess.    PULMONARY A: Acute respiratory failure with hypoxemia Obesity, atelectasis P:   I am abandoning attempts to wean today until it is clear his cardiac status is stable.  Cardiac: Concerning for non-STEMI, certainly he has risk factors. I spoke with Dr. Arnoldo Morale of neurosurgery, who thinks anticoagulation and antiplatelet therapy are acceptable. Add scheduled beta blocker, continue aspirin, add statin, consult cardiology RE: full dose heparin or intervention if troponin rises.    HEMATOLOGIC A:   Why does a man who has been acutely ill for several weeks have a Hgb of 15? Is he truly Pickwickian  or do we need to reimage his kidney? Follow up once cardiac status is stabilized  P:  Continue Lovenox for DVT prophylaxis Trend CBC, monitor for bleeding   INFECTIOUS A:   Brain abscess P:   Antibiotics per infectious disease Follow-up culture   ENDOCRINE A:   Diabetes type 2 P:   Sliding scale insulin      My cc time 32 minutes  Lars Masson, MD [facility] Pager: [phone] Cell: [phone] After 3pm or if no response, call [phone]   03/16/2017, 7:58 AM

## 2017-03-16 NOTE — Progress Notes (Signed)
Inpatient Diabetes Program Recommendations  AACE/ADA: New Consensus Statement on Inpatient Glycemic Control (2015)  Target Ranges:  Prepandial:   less than 140 mg/dL      Peak postprandial:   less than 180 mg/dL (1-2 hours)      Critically ill patients:  140 - 180 mg/dL   Results for Timothy Jordan, Timothy Jordan (MRN [id number]) as of 03/16/2017 09:42  Ref. Range 03/15/2017 08:07 03/15/2017 12:17 03/15/2017 16:02 03/15/2017 19:52 03/15/2017 23:38 03/16/2017 03:52 03/16/2017 08:14  Glucose-Capillary Latest Ref Range: 65 - 99 mg/dL 409123 (H) 811146 (H) 914178 (H) 214 (H) 311 (H) 286 (H) 298 (H)   Results for Timothy Jordan, Timothy Jordan (MRN [id number]) as of 03/16/2017 09:42  Ref. Range 03/16/2017 03:34  Glucose Latest Ref Range: 65 - 99 mg/dL 086322 (H)    Review of Glycemic Control  Current orders for Inpatient glycemic control: Lantus 22 units daily, Novolog 0-20 units Q4H, Novolog 4 units Q4H for tube feeding coverage; Decadron 4 mg Q6H  Inpatient Diabetes Program Recommendations: Insulin - Basal: In reveiwing chart, noted Lantus was NOT GIVEN on 03/15/17 (RN charted as Held b/c patient was NPO). As a result, glucose up to 298 mg/dl this morning. Please consider increasing Lantus slightly to 24 units daily to start today. NURSING, please administer Lantus as ordered unless MD provides order to hold.  Thanks, Orlando PennerMarie Theta Leaf, RN, MSN, CDE Diabetes Coordinator Inpatient Diabetes Program [phone] (Team Pager from 8am to 5pm)

## 2017-03-16 NOTE — Progress Notes (Signed)
CRITICAL VALUE ALERT  Critical Value:  Troponin 1.83  Date & Time Notied:  03/16/2017 0740  Provider Notified: Dr. Wallace CullensGray  Orders Received/Actions taken: MD will round on pt.  Pt on amiodarone gtt.

## 2017-03-16 NOTE — Progress Notes (Signed)
eLink Physician-Brief Progress Note Patient Name: Timothy NuttingKelvin M Jordan DOB: 1955/08/29 MRN: [id number]   Date of Service  03/16/2017  HPI/Events of Note  Request for AM labs.  eICU Interventions  Will order: 1. CBC with diff, CMP, Mg++ level and PO4--- level at 5  AM.     Intervention Category Major Interventions: Other:  Sommer,Steven Dennard Nipugene 03/16/2017, 3:15 AM

## 2017-03-16 NOTE — Progress Notes (Signed)
[facility]   Reason for visit: Follow up on brain abscess  Interval History: no acute changes, some tracking today by Dr. Arnoldo Morale.  WBC 20, continues on decadron. Flagyl stopped by me.    Physical Exam: Constitutional:  Vitals:   03/16/17 0800 03/16/17 0945  BP:    Pulse:    Resp:    Temp: 98.7 F (37.1 C)   SpO2:  96%   patient does not respond Eyes: anicteric,open HENT: trach Respiratory: on vent Cardiovascular: RRR GI: soft, nt, nd  Review of Systems: Unable to be assessed due to mental status  Lab Results  Component Value Date   WBC 20.9 (H) 03/16/2017   HGB 15.1 03/16/2017   HCT 48.1 03/16/2017   MCV 93.6 03/16/2017   PLT 255 03/16/2017    Lab Results  Component Value Date   CREATININE 2.57 (H) 03/16/2017   BUN 66 (H) 03/16/2017   NA 153 (H) 03/16/2017   K 4.4 03/16/2017   CL 119 (H) 03/16/2017   CO2 27 03/16/2017    Lab Results  Component Value Date   ALT 21 03/16/2017   AST 30 03/16/2017   ALKPHOS 106 03/16/2017     Microbiology: Recent Results (from the past 240 hour(s))  MRSA PCR Screening     Status: None   Collection Time: 03/07/17 10:45 AM  Result Value Ref Range Status   MRSA by PCR NEGATIVE NEGATIVE Final    Comment:        The GeneXpert MRSA Assay (FDA approved for NASAL specimens only), is one component of a comprehensive MRSA colonization surveillance program. It is not intended to diagnose MRSA infection nor to guide or monitor treatment for MRSA infections.   Culture, respiratory (tracheal aspirate)     Status: None   Collection Time: 03/07/17 10:54 AM  Result Value Ref Range Status   Specimen Description TRACHEAL ASPIRATE  Final   Special Requests Normal  Final   Gram Stain   Final    RARE WBC PRESENT, PREDOMINANTLY PMN RARE GRAM POSITIVE COCCI IN PAIRS RARE GRAM NEGATIVE RODS    Culture Consistent with normal respiratory flora.  Final   Report Status 03/09/2017 FINAL  Final  Culture,  blood (routine x 2)     Status: None   Collection Time: 03/07/17 12:52 PM  Result Value Ref Range Status   Specimen Description BLOOD RIGHT ANTECUBITAL  Final   Special Requests IN PEDIATRIC BOTTLE Blood Culture adequate volume  Final   Culture NO GROWTH 5 DAYS  Final   Report Status 03/12/2017 FINAL  Final  Culture, blood (routine x 2)     Status: None   Collection Time: 03/07/17 12:58 PM  Result Value Ref Range Status   Specimen Description BLOOD BLOOD RIGHT FOREARM  Final   Special Requests IN PEDIATRIC BOTTLE Blood Culture adequate volume  Final   Culture NO GROWTH 5 DAYS  Final   Report Status 03/12/2017 FINAL  Final  Surgical pcr screen     Status: None   Collection Time: 03/08/17 11:05 AM  Result Value Ref Range Status   MRSA, PCR NEGATIVE NEGATIVE Final   Staphylococcus aureus NEGATIVE NEGATIVE Final    Comment: (NOTE) The Xpert SA Assay (FDA approved for NASAL specimens in patients 25 years of age and older), is one component of a comprehensive surveillance program. It is not intended to diagnose infection nor to guide or monitor treatment.   Aerobic/Anaerobic Culture (surgical/deep wound)  Status: None   Collection Time: 03/08/17  3:49 PM  Result Value Ref Range Status   Specimen Description ABSCESS  Final   Special Requests BRAIN  Final   Gram Stain   Final    ABUNDANT WBC PRESENT,BOTH PMN AND MONONUCLEAR MODERATE GRAM POSITIVE COCCI IN PAIRS    Culture   Final    FEW VIRIDANS STREPTOCOCCUS NO ANAEROBES ISOLATED    Report Status 03/14/2017 FINAL  Final   Organism ID, Bacteria VIRIDANS STREPTOCOCCUS  Final      Susceptibility   Viridans streptococcus - MIC*    PENICILLIN INTERMEDIATE Intermediate     CEFTRIAXONE 1 SENSITIVE Sensitive     ERYTHROMYCIN <=0.12 SENSITIVE Sensitive     LEVOFLOXACIN <=0.25 SENSITIVE Sensitive     VANCOMYCIN 0.5 SENSITIVE Sensitive     * FEW VIRIDANS STREPTOCOCCUS  Culture, respiratory (NON-Expectorated)     Status: None    Collection Time: 03/11/17  2:00 PM  Result Value Ref Range Status   Specimen Description TRACHEAL ASPIRATE  Final   Special Requests NONE  Final   Gram Stain   Final    FEW WBC PRESENT,BOTH PMN AND MONONUCLEAR RARE SQUAMOUS EPITHELIAL CELLS PRESENT RARE GRAM POSITIVE COCCI IN PAIRS    Culture RARE CANDIDA ALBICANS  Final   Report Status 03/14/2017 FINAL  Final  Culture, bal-quantitative     Status: Abnormal   Collection Time: 03/11/17  4:29 PM  Result Value Ref Range Status   Specimen Description BRONCHIAL ALVEOLAR LAVAGE  Final   Special Requests NONE  Final   Gram Stain   Final    ABUNDANT WBC PRESENT, PREDOMINANTLY MONONUCLEAR NO ORGANISMS SEEN    Culture (A)  Final    40,000 COLONIES/mL Consistent with normal respiratory flora.   Report Status 03/14/2017 FINAL  Final    Impression/Plan:  1. Brain abscess - culture noted and have stopped flagyl.  Will continue to monitor cultures.   2.  Edema - on decadron, will continue to monitor mental status  3.  Medication monitoring - checking CBC on ceftriaxone and no leukopenia, thrombocytopenia. Continuing to monitor.   I will continue to follow intermittently.

## 2017-03-16 NOTE — Progress Notes (Signed)
eLink Physician-Brief Progress Note Patient Name: Timothy NuttingKelvin M Jordan DOB: 03/28/1956 MRN: [id number]   Date of Service  03/16/2017  HPI/Events of Note  NSVT - duration about 1 minute accroding to bedside nurse. K+ = 4.4 and Mg++ = 2.8. Now in NSR with a rate = 92.  eICU Interventions  Will order: 1. 12 Lead EKG now.  2. Cycle Troponin. 3. Amiodarone IV load and infusion.      Intervention Category Major Interventions: Arrhythmia - evaluation and management  Sommer,Steven Eugene 03/16/2017, 5:39 AM

## 2017-03-17 ENCOUNTER — Inpatient Hospital Stay (HOSPITAL_COMMUNITY)

## 2017-03-17 DIAGNOSIS — R7881 Bacteremia: Secondary | ICD-10-CM

## 2017-03-17 LAB — GLUCOSE, CAPILLARY
GLUCOSE-CAPILLARY: 318 mg/dL — AB (ref 65–99)
GLUCOSE-CAPILLARY: 320 mg/dL — AB (ref 65–99)
GLUCOSE-CAPILLARY: 244 mg/dL — AB (ref 65–99)
GLUCOSE-CAPILLARY: 242 mg/dL — AB (ref 65–99)
GLUCOSE-CAPILLARY: 205 mg/dL — AB (ref 65–99)
Glucose-Capillary: 295 mg/dL — ABNORMAL HIGH (ref 65–99)
Glucose-Capillary: 350 mg/dL — ABNORMAL HIGH (ref 65–99)
Glucose-Capillary: 457 mg/dL — ABNORMAL HIGH (ref 65–99)
Glucose-Capillary: 264 mg/dL — ABNORMAL HIGH (ref 65–99)
Glucose-Capillary: 259 mg/dL — ABNORMAL HIGH (ref 65–99)
Glucose-Capillary: 187 mg/dL — ABNORMAL HIGH (ref 65–99)
Glucose-Capillary: 181 mg/dL — ABNORMAL HIGH (ref 65–99)

## 2017-03-17 LAB — ECHOCARDIOGRAM COMPLETE
Height: 70 in
WEIGHTICAEL: 4003.55 [oz_av]

## 2017-03-17 MED ORDER — SODIUM CHLORIDE 0.9 % IV SOLN
INTRAVENOUS | Status: DC
  Administered 2017-03-17: 2.6 [IU]/h via INTRAVENOUS
  Administered 2017-03-18: 10.6 [IU]/h via INTRAVENOUS
  Administered 2017-03-18: 9.5 [IU]/h via INTRAVENOUS
  Administered 2017-03-19: 8.6 [IU]/h via INTRAVENOUS
  Administered 2017-03-19: 6.3 [IU]/h via INTRAVENOUS
  Administered 2017-03-21: 9.2 [IU]/h via INTRAVENOUS
  Administered 2017-03-21: 03:00:00 via INTRAVENOUS
  Filled 2017-03-17 (×10): qty 1

## 2017-03-17 NOTE — Progress Notes (Signed)
Notified Dr. Wallace CullensGray of pt's cbg being greater than 450 on the last check and new orders given for insulin gtt.

## 2017-03-17 NOTE — Progress Notes (Signed)
  Echocardiogram 2D Echocardiogram has been performed.  Leta JunglingCooper, Brooklyne Radke M 03/17/2017, 2:39 PM

## 2017-03-17 NOTE — Progress Notes (Signed)
PULMONARY / CRITICAL CARE MEDICINE   Name: Timothy Jordan MRN: [id number] DOB: 07-15-1955    ADMISSION DATE:  03/05/2017 CONSULTATION DATE: March 07, 2017  REFERRING MD: Dr. Cyndy Freeze  CHIEF COMPLAINT: Inability to protect airway, brain abscess  HISTORY OF PRESENT ILLNESS:   61 year old male admitted with a brain abscess, developed encephalopathy requiring intubation.  Pulmonary and critical care medicine consulted for ventilator management.  He underwent percutaneous tracheostomy 11/5. Early 11/7 he had an episode of SVT. He was started on amiodarone and has maintained NSR overnight.  EKG does not show ischemic changes to me, but his first troponin bumped to elevated at 1.8. He is still supported on mechanical ventilation. He has spontaneous eye opening, but does not interact with me at all. He was having some perpetual blinking during my visit today and when that stopped he was again showing some spontaneous movement of the left side   SUBJECTIVE:   unobtainable  VITAL SIGNS: BP (!) 156/78 (BP Location: Left Arm)   Pulse 77   Temp 100 F (37.8 C) (Oral)   Resp 18   Ht _0  (1.778 m) Comment: per family  Wt 250 lb 3.6 oz (113.5 kg)   SpO2 94%   BMI 35.90 kg/m   HEMODYNAMICS:    VENTILATOR SETTINGS: Vent Mode: PCV FiO2 (%):  [40 %] 40 % Set Rate:  [15 bmp] 15 bmp PEEP:  [10 cmH20] 10 cmH20 Plateau Pressure:  [17 cmH20-22 cmH20] 21 cmH20  INTAKE / OUTPUT: I/O last 3 completed shifts: In: 3954.3 [I.V.:1101.8; NG/GT:2280; IV Piggyback:572.5] Out: 6546 [Urine:4050]  PHYSICAL EXAMINATION:  General: Obese middle-aged male mechanically ventilated via tracheostomy  HENT: scalp wound well healed, new tracheostomy site is dry.   PULM: Unlabored on a pressure support of 15.  There is symmetric air movement few scattered rhonchi and no wheezing. CV: RRR, no gallop, no murmur or rub GI: obese BS+, soft, nontender MSK: normal bulk and tone Neuro: Has spontaneous eye opening  but he is not tracking for me nor is he responding to verbal instructions. Constant blinking. He spontaneously moves the left side but not the right.     LABS:  BMET Recent Labs  Lab 03/14/17 0449 03/15/17 0441 03/16/17 0334  NA 152* 155* 153*  K 4.2 4.3 4.4  CL 117* 121* 119*  CO2 _1 BUN 47* 55* 66*  CREATININE 2.57* 2.54* 2.57*  GLUCOSE 236* 164* 322*    Electrolytes Recent Labs  Lab 03/11/17 0803  03/13/17 0422 03/14/17 0449 03/15/17 0441 03/16/17 0334  CALCIUM 8.6*   < > 8.3* 8.6* 8.9 8.8*  MG 2.5*  --  2.5*  --   --  2.8*  PHOS 3.7  --   --   --   --  3.2   < > = values in this interval not displayed.    CBC Recent Labs  Lab 03/14/17 0449 03/15/17 0441 03/16/17 0334  WBC 12.5* 17.2* 20.9*  HGB 14.2 15.0 15.1  HCT 46.4 47.7 48.1  PLT 215 236 255    Coag's Recent Labs  Lab 03/14/17 0923  INR 1.38    Sepsis Markers No results for input(s): LATICACIDVEN, PROCALCITON, O2SATVEN in the last 168 hours.  ABG Recent Labs  Lab 03/11/17 0420 03/12/17 0321 03/13/17 0352  PHART 7.375 7.327* 7.387  PCO2ART 47.1 54.8* 46.8  PO2ART 61.7* 70.0* 68.6*    Liver Enzymes Recent Labs  Lab 03/11/17 0803 03/16/17 0334  AST 33 30  ALT  16* 21  ALKPHOS 82 106  BILITOT 0.4 0.3  ALBUMIN 2.1* 1.8*    Cardiac Enzymes Recent Labs  Lab 03/16/17 0544 03/16/17 1150 03/16/17 1739  TROPONINI 1.83* 1.79* 1.65*    Glucose Recent Labs  Lab 03/16/17 1118 03/16/17 1613 03/16/17 1951 03/16/17 2334 03/17/17 0357 03/17/17 0845  GLUCAP 328* 336* 290* 280* 295* 350*    Imaging No results found.   STUDIES:  ECHO 10/27 >> study stopped prematurely due to pt movement, mild LVH, LVEF 55-60% CT Head 10/28 >> Stable to slight enlargement of left basal ganglia masses compatible with abscesses on MRI. Worsening surrounding edema with 5 mm of rightward midline shift Renal US 10/29 >> complex left-sided renal cysts, no obstructive uropathy, trace  perinephric fluid is seen about both kidneys   CULTURES: BCx2 10/26 >> negative Sputum 10/28 >> normal flora   UA 10/26 >> negative HIV 10/26 >> negative  BCx2 10/28 >> GPC pairs 1/2 Brain Abscess 10/29 >> few GPC BAL 11/1 >> rare yeast  ANTIBIOTICS: Ceftriaxone 10/26 >> Metronidazole 10/26 >> Vancomycin 10/26 >> 10/27  SIGNIFICANT EVENTS: 10/28  Progressive decline in MS requiring intubation 10/29  Fever and shock overnight, requiring addition of phenylephrine gtt.  Brain abscess aspiration 10/30  Off vasopressors 10/31  No weaning, no change in neuro exam 11/02  Increased O2 needs overnight, resolved with increased PEEP  LINES/TUBES: ETT 10/28 >>  PICC 11/1 (IR) >>   DISCUSSION: 61 year old male with diabetes admitted with a brain abscess. Persistent respiratory failure and new SVT with elevated troponin this morning.   ASSESSMENT / PLAN:  NEUROLOGIC A:   Brain abscess Acute encephalopathy Status post brain abscess drainage P: On high-dose Rocephin and Flagyl for his brain abscess.  Strep Viridans was isolated from the brain abscess.  Follow-up CT will be obtained yesterday, shows perhaps a small area of infarct and smaller abscess.    PULMONARY A: Acute respiratory failure with hypoxemia Obesity, atelectasis P:   Cardiac status stable overnight, will resume attempts to transfer WOB.  Cardiac: Concerning for non-STEMI, certainly he has risk factors. I spoke with Dr. Arnoldo Morale of neurosurgery, who thinks anticoagulation and antiplatelet therapy are acceptable. Add scheduled beta blocker, continue aspirin, add statin, consult cardiology RE: full dose heparin or intervention if troponin rises. Echo ordered for wall motion abnormalities   HEMATOLOGIC A:   Why does a man who has been acutely ill for several weeks have a Hgb of 15? Is he truly Pickwickian or do we need to reimage his kidney? Follow up once cardiac status is stabilized  P:  Continue Lovenox for  DVT prophylaxis Trend CBC, monitor for bleeding   INFECTIOUS A:   Brain abscess P:   Antibiotics per infectious disease Follow-up culture  Neuro: He seems to fax and wane with continuous blinking during periods of decreased limb movement. Check EEG today   ENDOCRINE A:   Diabetes type 2 P:   Sliding scale insulin      My cc time 32 minutes  Lars Masson, MD [facility] Pager: [phone] Cell: [phone] After 3pm or if no response, call [phone]   03/17/2017, 9:27 AM

## 2017-03-17 NOTE — Progress Notes (Signed)
Patient ID: Timothy NuttingKelvin M Jordan, male   DOB: May 23, 1955, 61 y.o.   MRN: [id number] Subjective:  The patient is without change. His wife and son are at the bedside.  Objective: Vital signs in last 24 hours: Temp:  [98.2 F (36.8 C)-100 F (37.8 C)] 99.3 F (37.4 C) (11/07 1200) Pulse Rate:  [73-87] 78 (11/07 1500) Resp:  [16-33] 19 (11/07 1500) BP: (131-174)/(66-87) 131/66 (11/07 1452) SpO2:  [93 %-98 %] 98 % (11/07 1500) FiO2 (%):  [40 %] 40 % (11/07 1500) Weight:  [113.5 kg (250 lb 3.6 oz)] 113.5 kg (250 lb 3.6 oz) (11/07 0500)  Intake/Output from previous day: 11/06 0701 - 11/07 0700 In: 2961.3 [I.V.:863.8; WU/JW:1191G/GT:1675; IV Piggyback:422.5] Out: 2775 [Urine:2775] Intake/Output this shift: Total I/O In: 827.4 [I.V.:229.9; NG/GT:440; IV Piggyback:157.5] Out: 750 [Urine:750]  Physical exam Glasgow Coma Scale 9 trached, [id number]. He localizes on the left. He is right hemiplegic. His pupils are equal.  Lab Results: Recent Labs    03/15/17 0441 03/16/17 0334  WBC 17.2* 20.9*  HGB 15.0 15.1  HCT 47.7 48.1  PLT 236 255   BMET Recent Labs    03/15/17 0441 03/16/17 0334  NA 155* 153*  K 4.3 4.4  CL 121* 119*  CO2 27 27  GLUCOSE 164* 322*  BUN 55* 66*  CREATININE 2.54* 2.57*  CALCIUM 8.9 8.8*    Studies/Results: No results found.  Assessment/Plan: Left Basal ganglia brain abscesses: The patient is on empiric antibiotics. He is clinically stable. I have answered all the patient's family's questions.  LOS: 12 days     Gisell Buehrle D 03/17/2017, 3:52 PM

## 2017-03-17 NOTE — Progress Notes (Signed)
Inpatient Diabetes Program Recommendations  AACE/ADA: New Consensus Statement on Inpatient Glycemic Control (2015)  Target Ranges:  Prepandial:   less than 140 mg/dL      Peak postprandial:   less than 180 mg/dL (1-2 hours)      Critically ill patients:  140 - 180 mg/dL   Results for Timothy NuttingSTOKES, Charls M (MRN [id number]) as of 03/17/2017 10:15  Ref. Range 03/16/2017 08:14 03/16/2017 11:18 03/16/2017 16:13 03/16/2017 19:51 03/16/2017 23:34 03/17/2017 03:57 03/17/2017 08:45  Glucose-Capillary Latest Ref Range: 65 - 99 mg/dL 409298 (H) 811328 (H) 914336 (H) 290 (H) 280 (H) 295 (H) 350 (H)   Review of Glycemic Control  Current orders for Inpatient glycemic control: Lantus 22 units daily, Novolog 0-20 units Q4H, Novolog 4 units Q4H for tube feeding coverage; Decadron 4 mg Q6H  Inpatient Diabetes Program Recommendations: Insulin - IV drip/GlucoStabilizer: CBGs over the past 12 hours have ranged form 280-350 mg/dl. Please consider discontinuing all insulin orders and use ICU Glycemic Control order set Phase 2 (IV insulin) to improve glycemic control and determine insulin needs.  Thanks, Orlando PennerMarie Laquinn Shippy, RN, MSN, CDE Diabetes Coordinator Inpatient Diabetes Program [phone] (Team Pager from 8am to 5pm)

## 2017-03-17 NOTE — Procedures (Signed)
ELECTROENCEPHALOGRAM REPORT  Date of Study: 03/17/2017  Patient's Name: Ludger NuttingKelvin M Batt MRN: [id number] Date of Birth: 12-21-1955  Referring Provider: Dr. Warren LacyWalter Gray  Clinical History: This is a 61 year old man with brain abscess, encephalopathy. He was noted to have repeated eye blinking that stopped when he had spontaneous movement on the left side  Medications: levETIRAcetam (KEPPRA) 750 mg in sodium chloride 0.9 % 100 mL IVPB  fentaNYL 2500mcg in NS 250mL (3010mcg/ml) infusion-PREMIX  acetaminophen (TYLENOL) tablet 650 mg  albuterol (PROVENTIL) (2.5 MG/3ML) 0.083% nebulizer solution 2.5 mg  amiodarone (NEXTERONE PREMIX) 360-4.14 MG/200ML-% (1.8 mg/mL) IV infusion  aspirin chewable tablet 81 mg  atorvastatin (LIPITOR) tablet 40 mg  bisacodyl (DULCOLAX) suppository 10 mg  cefTRIAXone (ROCEPHIN) 2 g in dextrose 5 % 50 mL IVPB  dexamethasone (DECADRON) injection 4 mg  famotidine (PEPCID) 40 MG/5ML suspension 20 mg  free water 300 mL  hydrALAZINE (APRESOLINE) injection 10 mg  insulin aspart (novoLOG) injection 0-20 Units  insulin aspart (novoLOG) injection 4 Units  insulin glargine (LANTUS) injection 22 Units  lidocaine (PF) (XYLOCAINE) 1 % injection   Technical Summary: A multichannel digital EEG recording measured by the international 10-20 system with electrodes applied with paste and impedances below 5000 ohms performed as portable with EKG monitoring in an unresponsive patient s/p tracheostomy on ventilator, sedated with Fentanyl. Hyperventilation and photic stimulation were not performed.  The digital EEG was referentially recorded, reformatted, and digitally filtered in a variety of bipolar and referential montages for optimal display.   Description: The patient is unresponsive during the recording. There is no clear posterior dominant rhythm. The background consists of a large amount of diffuse low voltage 2-3 Hz delta slowing. Normal sleep architecture is not seen. There were  several prolonged periods of very low voltage 11 Hz activity localized on the C3 electrode that does not evolve in frequency or amplitude, appearing to be electrode artifact. With noxious stimulation, there is no reactivity seen. There were no epileptiform discharges or electrographic seizures seen.    EKG lead was unremarkable.  Impression: This sedated EEG is abnormal due to severe diffuse low voltage slowing of the background.  Clinical Correlation of the above findings indicates diffuse cerebral dysfunction that is non-specific in etiology and can be seen with hypoxic/ischemic injury, toxic/metabolic encephalopathies, neurodegenerative disorders, or medication effect from Fentanyl.  Low voltage 11 Hz activity over the left C3 region appears artifactual. The absence of epileptiform discharges does not rule out a clinical diagnosis of epilepsy.  Clinical correlation is advised.   Patrcia DollyKaren Jaysin Gayler, M.D.

## 2017-03-17 NOTE — Progress Notes (Signed)
[facility]   Reason for visit: Follow up on brain abscess  Interval History: no acute changes.    Day 13 total antibiotics Day 13 ceftriaxone   Physical Exam: Constitutional:  Vitals:   03/17/17 0800 03/17/17 0900  BP: (!) 156/78 (!) 163/81  Pulse: 77 83  Resp: 18 (!) 28  Temp: 100 F (37.8 C)   SpO2: 94% 94%   patient does not respond Eyes: anicteric,open HENT: trach Respiratory: on vent Cardiovascular: RRR GI: soft, nt, nd  Review of Systems: Unable to be assessed due to mental status  Lab Results  Component Value Date   WBC 20.9 (H) 03/16/2017   HGB 15.1 03/16/2017   HCT 48.1 03/16/2017   MCV 93.6 03/16/2017   PLT 255 03/16/2017    Lab Results  Component Value Date   CREATININE 2.57 (H) 03/16/2017   BUN 66 (H) 03/16/2017   NA 153 (H) 03/16/2017   K 4.4 03/16/2017   CL 119 (H) 03/16/2017   CO2 27 03/16/2017    Lab Results  Component Value Date   ALT 21 03/16/2017   AST 30 03/16/2017   ALKPHOS 106 03/16/2017     Microbiology: Recent Results (from the past 240 hour(s))  MRSA PCR Screening     Status: None   Collection Time: 03/07/17 10:45 AM  Result Value Ref Range Status   MRSA by PCR NEGATIVE NEGATIVE Final    Comment:        The GeneXpert MRSA Assay (FDA approved for NASAL specimens only), is one component of a comprehensive MRSA colonization surveillance program. It is not intended to diagnose MRSA infection nor to guide or monitor treatment for MRSA infections.   Culture, respiratory (tracheal aspirate)     Status: None   Collection Time: 03/07/17 10:54 AM  Result Value Ref Range Status   Specimen Description TRACHEAL ASPIRATE  Final   Special Requests Normal  Final   Gram Stain   Final    RARE WBC PRESENT, PREDOMINANTLY PMN RARE GRAM POSITIVE COCCI IN PAIRS RARE GRAM NEGATIVE RODS    Culture Consistent with normal respiratory flora.  Final   Report Status 03/09/2017 FINAL  Final  Culture, blood  (routine x 2)     Status: None   Collection Time: 03/07/17 12:52 PM  Result Value Ref Range Status   Specimen Description BLOOD RIGHT ANTECUBITAL  Final   Special Requests IN PEDIATRIC BOTTLE Blood Culture adequate volume  Final   Culture NO GROWTH 5 DAYS  Final   Report Status 03/12/2017 FINAL  Final  Culture, blood (routine x 2)     Status: None   Collection Time: 03/07/17 12:58 PM  Result Value Ref Range Status   Specimen Description BLOOD BLOOD RIGHT FOREARM  Final   Special Requests IN PEDIATRIC BOTTLE Blood Culture adequate volume  Final   Culture NO GROWTH 5 DAYS  Final   Report Status 03/12/2017 FINAL  Final  Surgical pcr screen     Status: None   Collection Time: 03/08/17 11:05 AM  Result Value Ref Range Status   MRSA, PCR NEGATIVE NEGATIVE Final   Staphylococcus aureus NEGATIVE NEGATIVE Final    Comment: (NOTE) The Xpert SA Assay (FDA approved for NASAL specimens in patients 64 years of age and older), is one component of a comprehensive surveillance program. It is not intended to diagnose infection nor to guide or monitor treatment.   Aerobic/Anaerobic Culture (surgical/deep wound)     Status: None  Collection Time: 03/08/17  3:49 PM  Result Value Ref Range Status   Specimen Description ABSCESS  Final   Special Requests BRAIN  Final   Gram Stain   Final    ABUNDANT WBC PRESENT,BOTH PMN AND MONONUCLEAR MODERATE GRAM POSITIVE COCCI IN PAIRS    Culture   Final    FEW VIRIDANS STREPTOCOCCUS NO ANAEROBES ISOLATED    Report Status 03/14/2017 FINAL  Final   Organism ID, Bacteria VIRIDANS STREPTOCOCCUS  Final      Susceptibility   Viridans streptococcus - MIC*    PENICILLIN INTERMEDIATE Intermediate     CEFTRIAXONE 1 SENSITIVE Sensitive     ERYTHROMYCIN <=0.12 SENSITIVE Sensitive     LEVOFLOXACIN <=0.25 SENSITIVE Sensitive     VANCOMYCIN 0.5 SENSITIVE Sensitive     * FEW VIRIDANS STREPTOCOCCUS  Culture, respiratory (NON-Expectorated)     Status: None   Collection  Time: 03/11/17  2:00 PM  Result Value Ref Range Status   Specimen Description TRACHEAL ASPIRATE  Final   Special Requests NONE  Final   Gram Stain   Final    FEW WBC PRESENT,BOTH PMN AND MONONUCLEAR RARE SQUAMOUS EPITHELIAL CELLS PRESENT RARE GRAM POSITIVE COCCI IN PAIRS    Culture RARE CANDIDA ALBICANS  Final   Report Status 03/14/2017 FINAL  Final  Culture, bal-quantitative     Status: Abnormal   Collection Time: 03/11/17  4:29 PM  Result Value Ref Range Status   Specimen Description BRONCHIAL ALVEOLAR LAVAGE  Final   Special Requests NONE  Final   Gram Stain   Final    ABUNDANT WBC PRESENT, PREDOMINANTLY MONONUCLEAR NO ORGANISMS SEEN    Culture (A)  Final    40,000 COLONIES/mL Consistent with normal respiratory flora.   Report Status 03/14/2017 FINAL  Final    Impression/Plan:  1. Brain abscess - no new culture growth.   2.  Medication monitoring - checking CBC on ceftriaxone and no leukopenia, thrombocytopenia. WBC up to 20.9, on decadron.

## 2017-03-17 NOTE — Progress Notes (Signed)
EEG complete - results pending 

## 2017-03-18 ENCOUNTER — Encounter (HOSPITAL_COMMUNITY): Payer: Self-pay

## 2017-03-18 LAB — CBC WITH DIFFERENTIAL/PLATELET
BASOS ABS: 0 10*3/uL (ref 0.0–0.1)
BLASTS: 0 %
Band Neutrophils: 1 %
Basophils Relative: 0 %
Eosinophils Absolute: 0 10*3/uL (ref 0.0–0.7)
Eosinophils Relative: 0 %
HEMATOCRIT: 47.8 % (ref 39.0–52.0)
Hemoglobin: 15.3 g/dL (ref 13.0–17.0)
Lymphocytes Relative: 7 %
Lymphs Abs: 1.5 10*3/uL (ref 0.7–4.0)
MCH: 29.8 pg (ref 26.0–34.0)
MCHC: 32 g/dL (ref 30.0–36.0)
MCV: 93.2 fL (ref 78.0–100.0)
METAMYELOCYTES PCT: 0 %
MONOS PCT: 2 %
Monocytes Absolute: 0.4 10*3/uL (ref 0.1–1.0)
Myelocytes: 0 %
NEUTROS ABS: 19.5 10*3/uL — AB (ref 1.7–7.7)
NEUTROS PCT: 90 %
NRBC: 0 /100{WBCs}
Other: 0 %
Platelets: 259 10*3/uL (ref 150–400)
Promyelocytes Absolute: 0 %
RBC: 5.13 MIL/uL (ref 4.22–5.81)
RDW: 16 % — AB (ref 11.5–15.5)
WBC: 21.4 10*3/uL — AB (ref 4.0–10.5)

## 2017-03-18 LAB — BLOOD GAS, ARTERIAL
ACID-BASE EXCESS: 2.2 mmol/L — AB (ref 0.0–2.0)
Bicarbonate: 25.8 mmol/L (ref 20.0–28.0)
DRAWN BY: 517021
FIO2: 40
Hi Frequency JET Vent Rate: 10
O2 SAT: 96 %
PH ART: 7.456 — AB (ref 7.350–7.450)
PO2 ART: 83.6 mmHg (ref 83.0–108.0)
PRESSURE CONTROL: 12 cmH2O
Patient temperature: 98.3
RATE: 15 resp/min
pCO2 arterial: 37.1 mmHg (ref 32.0–48.0)

## 2017-03-18 LAB — GLUCOSE, CAPILLARY
GLUCOSE-CAPILLARY: 133 mg/dL — AB (ref 65–99)
GLUCOSE-CAPILLARY: 141 mg/dL — AB (ref 65–99)
GLUCOSE-CAPILLARY: 153 mg/dL — AB (ref 65–99)
GLUCOSE-CAPILLARY: 176 mg/dL — AB (ref 65–99)
GLUCOSE-CAPILLARY: 174 mg/dL — AB (ref 65–99)
GLUCOSE-CAPILLARY: 188 mg/dL — AB (ref 65–99)
GLUCOSE-CAPILLARY: 180 mg/dL — AB (ref 65–99)
GLUCOSE-CAPILLARY: 159 mg/dL — AB (ref 65–99)
GLUCOSE-CAPILLARY: 153 mg/dL — AB (ref 65–99)
GLUCOSE-CAPILLARY: 157 mg/dL — AB (ref 65–99)
GLUCOSE-CAPILLARY: 139 mg/dL — AB (ref 65–99)
Glucose-Capillary: 139 mg/dL — ABNORMAL HIGH (ref 65–99)
Glucose-Capillary: 142 mg/dL — ABNORMAL HIGH (ref 65–99)
Glucose-Capillary: 146 mg/dL — ABNORMAL HIGH (ref 65–99)
Glucose-Capillary: 151 mg/dL — ABNORMAL HIGH (ref 65–99)
Glucose-Capillary: 158 mg/dL — ABNORMAL HIGH (ref 65–99)
Glucose-Capillary: 159 mg/dL — ABNORMAL HIGH (ref 65–99)
Glucose-Capillary: 165 mg/dL — ABNORMAL HIGH (ref 65–99)
Glucose-Capillary: 173 mg/dL — ABNORMAL HIGH (ref 65–99)
Glucose-Capillary: 178 mg/dL — ABNORMAL HIGH (ref 65–99)
Glucose-Capillary: 187 mg/dL — ABNORMAL HIGH (ref 65–99)
Glucose-Capillary: 205 mg/dL — ABNORMAL HIGH (ref 65–99)

## 2017-03-18 LAB — BASIC METABOLIC PANEL
Anion gap: 7 (ref 5–15)
BUN: 56 mg/dL — AB (ref 6–20)
CALCIUM: 8.7 mg/dL — AB (ref 8.9–10.3)
CO2: 26 mmol/L (ref 22–32)
CREATININE: 2.44 mg/dL — AB (ref 0.61–1.24)
Chloride: 119 mmol/L — ABNORMAL HIGH (ref 101–111)
GFR calc Af Amer: 32 mL/min — ABNORMAL LOW (ref >60)
GFR, EST NON AFRICAN AMERICAN: 27 mL/min — AB (ref >60)
Glucose, Bld: 135 mg/dL — ABNORMAL HIGH (ref 65–99)
Potassium: 4 mmol/L (ref 3.5–5.1)
SODIUM: 152 mmol/L — AB (ref 135–145)

## 2017-03-18 LAB — MAGNESIUM: MAGNESIUM: 2.3 mg/dL (ref 1.7–2.4)

## 2017-03-18 MED ORDER — AMIODARONE HCL 200 MG PO TABS: 200.0000 mg | ORAL_TABLET | Freq: Every day | ORAL | Status: DC

## 2017-03-18 MED ORDER — AMIODARONE HCL 200 MG PO TABS
200.0000 mg | ORAL_TABLET | Freq: Two times a day (BID) | ORAL | Status: DC
  Administered 2017-03-18 – 2017-03-21 (×7): 200 mg via ORAL
  Filled 2017-03-18 (×7): qty 1

## 2017-03-18 NOTE — Progress Notes (Signed)
PULMONARY / CRITICAL CARE MEDICINE   Name: Timothy Jordan MRN: [id number] DOB: March 20, 1956    ADMISSION DATE:  03/05/2017 CONSULTATION DATE: March 07, 2017  REFERRING MD: Dr. Cyndy Freeze  CHIEF COMPLAINT: Inability to protect airway, brain abscess  HISTORY OF PRESENT ILLNESS:   61 year old male admitted with a brain abscess, developed encephalopathy requiring intubation.  Pulmonary and critical care medicine consulted for ventilator management.  He underwent percutaneous tracheostomy 11/5. Early 11/7 he had an episode of SVT. He was started on amiodarone and has maintained NSR since..  EKG does not show ischemic changes to me, but his first troponin bumped to elevated at 1.8. He is still supported on mechanical ventilation. He has spontaneous eye opening, but still does not interact with me at all.    SUBJECTIVE:   unobtainable  VITAL SIGNS: BP (!) 159/74   Pulse 75   Temp 99 F (37.2 C) (Axillary)   Resp 19   Ht _0  (1.778 m) Comment: per family  Wt 253 lb 15.5 oz (115.2 kg)   SpO2 96%   BMI 36.44 kg/m   HEMODYNAMICS:    VENTILATOR SETTINGS: Vent Mode: PCV FiO2 (%):  [40 %] 40 % Set Rate:  [15 bmp] 15 bmp Vt Set:  [550 mL] 550 mL PEEP:  [10 cmH20] 10 cmH20 Pressure Support:  [10 cmH20] 10 cmH20 Plateau Pressure:  [19 cmH20-20 cmH20] 19 cmH20  INTAKE / OUTPUT: I/O last 3 completed shifts: In: 4061.8 [I.V.:1166.8; NG/GT:2580; IV Piggyback:315] Out: 7654 [Urine:4265]  PHYSICAL EXAMINATION:  General: Obese middle-aged male mechanically ventilated via tracheostomy  HENT: scalp wound well healed, new tracheostomy site is dry.   PULM: Unlabored on a pressure support of 10.  There is symmetric air movement few scattered rhonchi and no wheezing. CV: RRR, no gallop, no murmur or rub GI: obese BS+, soft, nontender MSK: normal bulk and tone Neuro: Has spontaneous eye opening but he is not tracking for me nor is he responding to verbal instructions. Pupils equal.  Withdrawal on left from pain not right.     LABS:  BMET Recent Labs  Lab 03/15/17 0441 03/16/17 0334 03/18/17 0415  NA 155* 153* 152*  K 4.3 4.4 4.0  CL 121* 119* 119*  CO2 _1 BUN 55* 66* 56*  CREATININE 2.54* 2.57* 2.44*  GLUCOSE 164* 322* 135*    Electrolytes Recent Labs  Lab 03/13/17 0422  03/15/17 0441 03/16/17 0334 03/18/17 0415  CALCIUM 8.3*   < > 8.9 8.8* 8.7*  MG 2.5*  --   --  2.8* 2.3  PHOS  --   --   --  3.2  --    < > = values in this interval not displayed.    CBC Recent Labs  Lab 03/15/17 0441 03/16/17 0334 03/18/17 0415  WBC 17.2* 20.9* 21.4*  HGB 15.0 15.1 15.3  HCT 47.7 48.1 47.8  PLT 236 255 259    Coag's Recent Labs  Lab 03/14/17 0923  INR 1.38    Sepsis Markers No results for input(s): LATICACIDVEN, PROCALCITON, O2SATVEN in the last 168 hours.  ABG Recent Labs  Lab 03/12/17 0321 03/13/17 0352 03/18/17 0342  PHART 7.327* 7.387 7.456*  PCO2ART 54.8* 46.8 37.1  PO2ART 70.0* 68.6* 83.6    Liver Enzymes Recent Labs  Lab 03/16/17 0334  AST 30  ALT 21  ALKPHOS 106  BILITOT 0.3  ALBUMIN 1.8*    Cardiac Enzymes Recent Labs  Lab 03/16/17 0544 03/16/17 1150 03/16/17 1739  TROPONINI 1.83* 1.79* 1.65*    Glucose Recent Labs  Lab 03/18/17 0303 03/18/17 0411 03/18/17 0603 03/18/17 0651 03/18/17 0754 03/18/17 0856  GLUCAP 142* 133* 141* 153* 139* 176*    Imaging No results found.   STUDIES:  ECHO 10/27 >> study stopped prematurely due to pt movement, mild LVH, LVEF 55-60% CT Head 10/28 >> Stable to slight enlargement of left basal ganglia masses compatible with abscesses on MRI. Worsening surrounding edema with 5 mm of rightward midline shift Renal US 10/29 >> complex left-sided renal cysts, no obstructive uropathy, trace perinephric fluid is seen about both kidneys   CULTURES: BCx2 10/26 >> negative Sputum 10/28 >> normal flora   UA 10/26 >> negative HIV 10/26 >> negative  BCx2 10/28 >> GPC  pairs 1/2 Brain Abscess 10/29 >> few GPC BAL 11/1 >> rare yeast  ANTIBIOTICS: Ceftriaxone 10/26 >> Metronidazole 10/26 >> Vancomycin 10/26 >> 10/27  SIGNIFICANT EVENTS: 10/28  Progressive decline in MS requiring intubation 10/29  Fever and shock overnight, requiring addition of phenylephrine gtt.  Brain abscess aspiration 10/30  Off vasopressors 10/31  No weaning, no change in neuro exam 11/02  Increased O2 needs overnight, resolved with increased PEEP  LINES/TUBES: ETT 10/28 >>  PICC 11/1 (IR) >>   DISCUSSION: 61 year old male with diabetes admitted with a brain abscess. Persistent respiratory failure and new SVT with elevated troponin 11/7.   ASSESSMENT / PLAN:  NEUROLOGIC A:   Brain abscess Acute encephalopathy Status post brain abscess drainage P: On high-dose Rocephin  for his brain abscess.  Strep Viridans was isolated from the brain abscess. Mental status remains quite poor, worse than I would expect from CT findings alone. EEG yesterday negative for seizures. Check CMP, TSH in am.    PULMONARY A: Acute respiratory failure with hypoxemia Obesity, atelectasis P:   Cardiac status stable overnight, resuming attempts to transfer WOB.  Cardiac: Concerning for non-STEMI, certainly he has risk factors. I spoke with Dr. Arnoldo Morale of neurosurgery, who thinks anticoagulation and antiplatelet therapy are acceptable. Add scheduled beta blocker, continue aspirin, add statin, consult cardiology  Echo shows LVH, no wall motion abnormalities mentioned.  HEMATOLOGIC A:   Why does a man who has been acutely ill for several weeks have a Hgb of 15? Is he truly Pickwickian or do we need to reimage his kidney? Follow up once other issues are stabilized  P:  Continue Lovenox for DVT prophylaxis Trend CBC, monitor for bleeding   INFECTIOUS A:   Brain abscess P:   Antibiotics per infectious disease Follow-up culture    ENDOCRINE A:   Diabetes type 2 P:   Sliding scale  insulin      My cc time 32 minutes  Lars Masson, MD [facility] Pager: [phone] Cell: [phone] After 3pm or if no response, call [phone]   03/18/2017, 9:53 AM

## 2017-03-18 NOTE — Progress Notes (Addendum)
MD:  Please consider CM Consult for  LTAC consult as pt appears to need long-term ventilator weaning.    Thank you,   Quintella BatonJulie W. Alvah Gilder, RN, BSN  Trauma/Neuro ICU Case Manager [phone]

## 2017-03-19 ENCOUNTER — Inpatient Hospital Stay (HOSPITAL_COMMUNITY)

## 2017-03-19 LAB — GLUCOSE, CAPILLARY
GLUCOSE-CAPILLARY: 136 mg/dL — AB (ref 65–99)
GLUCOSE-CAPILLARY: 140 mg/dL — AB (ref 65–99)
GLUCOSE-CAPILLARY: 149 mg/dL — AB (ref 65–99)
GLUCOSE-CAPILLARY: 150 mg/dL — AB (ref 65–99)
GLUCOSE-CAPILLARY: 156 mg/dL — AB (ref 65–99)
GLUCOSE-CAPILLARY: 157 mg/dL — AB (ref 65–99)
GLUCOSE-CAPILLARY: 157 mg/dL — AB (ref 65–99)
GLUCOSE-CAPILLARY: 158 mg/dL — AB (ref 65–99)
GLUCOSE-CAPILLARY: 160 mg/dL — AB (ref 65–99)
GLUCOSE-CAPILLARY: 165 mg/dL — AB (ref 65–99)
GLUCOSE-CAPILLARY: 172 mg/dL — AB (ref 65–99)
GLUCOSE-CAPILLARY: 174 mg/dL — AB (ref 65–99)
GLUCOSE-CAPILLARY: 182 mg/dL — AB (ref 65–99)
GLUCOSE-CAPILLARY: 190 mg/dL — AB (ref 65–99)
GLUCOSE-CAPILLARY: 204 mg/dL — AB (ref 65–99)
Glucose-Capillary: 143 mg/dL — ABNORMAL HIGH (ref 65–99)
Glucose-Capillary: 146 mg/dL — ABNORMAL HIGH (ref 65–99)
Glucose-Capillary: 147 mg/dL — ABNORMAL HIGH (ref 65–99)
Glucose-Capillary: 149 mg/dL — ABNORMAL HIGH (ref 65–99)
Glucose-Capillary: 168 mg/dL — ABNORMAL HIGH (ref 65–99)
Glucose-Capillary: 176 mg/dL — ABNORMAL HIGH (ref 65–99)
Glucose-Capillary: 180 mg/dL — ABNORMAL HIGH (ref 65–99)
Glucose-Capillary: 182 mg/dL — ABNORMAL HIGH (ref 65–99)
Glucose-Capillary: 189 mg/dL — ABNORMAL HIGH (ref 65–99)

## 2017-03-19 LAB — COMPREHENSIVE METABOLIC PANEL
ALBUMIN: 1.7 g/dL — AB (ref 3.5–5.0)
ALT: 49 U/L (ref 17–63)
ANION GAP: 7 (ref 5–15)
AST: 49 U/L — ABNORMAL HIGH (ref 15–41)
Alkaline Phosphatase: 135 U/L — ABNORMAL HIGH (ref 38–126)
BILIRUBIN TOTAL: 0.3 mg/dL (ref 0.3–1.2)
BUN: 58 mg/dL — ABNORMAL HIGH (ref 6–20)
CHLORIDE: 117 mmol/L — AB (ref 101–111)
CO2: 25 mmol/L (ref 22–32)
Calcium: 8.6 mg/dL — ABNORMAL LOW (ref 8.9–10.3)
Creatinine, Ser: 2.34 mg/dL — ABNORMAL HIGH (ref 0.61–1.24)
GFR calc Af Amer: 33 mL/min — ABNORMAL LOW (ref >60)
GFR, EST NON AFRICAN AMERICAN: 29 mL/min — AB (ref >60)
GLUCOSE: 164 mg/dL — AB (ref 65–99)
POTASSIUM: 4.6 mmol/L (ref 3.5–5.1)
Sodium: 149 mmol/L — ABNORMAL HIGH (ref 135–145)
TOTAL PROTEIN: 6.1 g/dL — AB (ref 6.5–8.1)

## 2017-03-19 LAB — TSH: TSH: 0.233 u[IU]/mL — AB (ref 0.350–4.500)

## 2017-03-19 LAB — CORTISOL: Cortisol, Plasma: 1.5 ug/dL

## 2017-03-19 LAB — T4, FREE: FREE T4: 0.97 ng/dL (ref 0.61–1.12)

## 2017-03-19 MED ORDER — FUROSEMIDE 10 MG/ML IJ SOLN: 40.0000 mg | Freq: Every day | INTRAMUSCULAR | Status: DC

## 2017-03-19 NOTE — Progress Notes (Signed)
PULMONARY / CRITICAL CARE MEDICINE   Name: Timothy Jordan MRN: [id number] DOB: 09/02/1955    ADMISSION DATE:  03/05/2017 CONSULTATION DATE: March 07, 2017  REFERRING MD: Dr. Cyndy Freeze  CHIEF COMPLAINT: Inability to protect airway, brain abscess  HISTORY OF PRESENT ILLNESS:   61 year old male admitted with a brain abscess, who developed encephalopathy requiring intubation.  Pulmonary and critical care medicine consulted for ventilator management.  He underwent percutaneous tracheostomy 11/5. Early 11/7 he had an episode of SVT. He was started on amiodarone and has maintained NSR since..  EKG does not show ischemic changes to me, but his first troponin bumped to elevated at 1.8. He is still supported on mechanical ventilation, but tolerated BiPAP 10/10 last night.  He has spontaneous eye opening, but still does not interact with me at all. Constipation responded to suppository this morning.   SUBJECTIVE:   unobtainable  VITAL SIGNS: BP 133/69 (BP Location: Left Arm)   Pulse 75   Temp 100 F (37.8 C) (Oral)   Resp (!) 21   Ht _0  (1.778 m) Comment: per family  Wt 258 lb 6.1 oz (117.2 kg)   SpO2 99%   BMI 37.07 kg/m   HEMODYNAMICS:    VENTILATOR SETTINGS: Vent Mode: PCV FiO2 (%):  [40 %] 40 % Set Rate:  [15 bmp] 15 bmp PEEP:  [10 cmH20] 10 cmH20 Pressure Support:  [10 cmH20] 10 cmH20 Plateau Pressure:  [18 cmH20-19 cmH20] 19 cmH20  INTAKE / OUTPUT: I/O last 3 completed shifts: In: 5721.2 [I.V.:963.7; NG/GT:4285; IV Piggyback:472.5] Out: 3419 [Urine:4015]  PHYSICAL EXAMINATION:  General: Obese middle-aged male mechanically ventilated via tracheostomy  HENT: scalp wound well healed, new tracheostomy site is dry.   PULM: Unlabored on a pressure support of 10.  There is symmetric air movement few scattered rhonchi and no wheezing. CV: RRR, no gallop, no murmur or rub GI: obese BS+, soft, nontender MSK: normal bulk and tone Neuro: Has spontaneous eye opening but he is  not tracking for me nor is he responding to verbal instructions. Pupils equal. Withdrawal on left from pain not right.     LABS:  BMET Recent Labs  Lab 03/16/17 0334 03/18/17 0415 03/19/17 0414  NA 153* 152* 149*  K 4.4 4.0 4.6  CL 119* 119* 117*  CO2 _1 BUN 66* 56* 58*  CREATININE 2.57* 2.44* 2.34*  GLUCOSE 322* 135* 164*    Electrolytes Recent Labs  Lab 03/13/17 0422  03/16/17 0334 03/18/17 0415 03/19/17 0414  CALCIUM 8.3*   < > 8.8* 8.7* 8.6*  MG 2.5*  --  2.8* 2.3  --   PHOS  --   --  3.2  --   --    < > = values in this interval not displayed.    CBC Recent Labs  Lab 03/15/17 0441 03/16/17 0334 03/18/17 0415  WBC 17.2* 20.9* 21.4*  HGB 15.0 15.1 15.3  HCT 47.7 48.1 47.8  PLT 236 255 259    Coag's Recent Labs  Lab 03/14/17 0923  INR 1.38    Sepsis Markers No results for input(s): LATICACIDVEN, PROCALCITON, O2SATVEN in the last 168 hours.  ABG Recent Labs  Lab 03/13/17 0352 03/18/17 0342  PHART 7.387 7.456*  PCO2ART 46.8 37.1  PO2ART 68.6* 83.6    Liver Enzymes Recent Labs  Lab 03/16/17 0334 03/19/17 0414  AST 30 49*  ALT 21 49  ALKPHOS 106 135*  BILITOT 0.3 0.3  ALBUMIN 1.8* 1.7*    Cardiac  Enzymes Recent Labs  Lab 03/16/17 0544 03/16/17 1150 03/16/17 1739  TROPONINI 1.83* 1.79* 1.65*    Glucose Recent Labs  Lab 03/19/17 0257 03/19/17 0354 03/19/17 0454 03/19/17 0600 03/19/17 0719 03/19/17 0823  GLUCAP 174* 157* 149* 165* 150* 176*    Imaging Dg Abd Portable 1v  Result Date: 03/19/2017 CLINICAL DATA:  Obstipation. EXAM: PORTABLE ABDOMEN - 1 VIEW COMPARISON:  03/07/2017 FINDINGS: Enteric tube is present with tip over the right upper quadrant likely within the distal stomach or proximal duodenum. Bowel gas pattern is nonobstructive with mild fecal retention throughout the colon. No dilated small bowel loops. No free peritoneal air. Mild degenerate change of the spine. IMPRESSION: Nonobstructive bowel gas  pattern. Enteric tube with tip in the right upper quadrant likely over the distal stomach or proximal duodenum. Electronically Signed   By: Marin Olp M.D.   On: 03/19/2017 07:28     STUDIES:  ECHO 10/27 >> study stopped prematurely due to pt movement, mild LVH, LVEF 55-60% CT Head 10/28 >> Stable to slight enlargement of left basal ganglia masses compatible with abscesses on MRI. Worsening surrounding edema with 5 mm of rightward midline shift Renal US 10/29 >> complex left-sided renal cysts, no obstructive uropathy, trace perinephric fluid is seen about both kidneys   CULTURES: BCx2 10/26 >> negative Sputum 10/28 >> normal flora   UA 10/26 >> negative HIV 10/26 >> negative  BCx2 10/28 >> GPC pairs 1/2  Grew strep vitidans BAL 11/1 >> rare yeast  ANTIBIOTICS: Ceftriaxone 10/26 >> Metronidazole 10/26 >>discontinued Vancomycin 10/26 >> 10/27  SIGNIFICANT EVENTS: 10/28  Progressive decline in MS requiring intubation 10/29  Fever and shock overnight, requiring addition of phenylephrine gtt.  Brain abscess aspiration 10/30  Off vasopressors 10/31  No weaning, no change in neuro exam 11/02  Increased O2 needs overnight, resolved with increased PEEP  LINES/TUBES: ETT 10/28 >>  PICC 11/1 (IR) >>   DISCUSSION: 61 year old male with diabetes admitted with a brain abscess. Persistent respiratory failure and new SVT with elevated troponin 11/7.   ASSESSMENT / PLAN:  NEUROLOGIC A:   Brain abscess Acute encephalopathy Status post brain abscess drainage P: On high-dose Rocephin  for his brain abscess.  Strep Viridans was isolated from the brain abscess. Mental status remains quite poor, worse than I would expect from CT findings alone. EEG 11/7 negative for seizures. TSH this am low. Check free T4 for potential secondary hypothyroidism.    PULMONARY A: Acute respiratory failure with hypoxemia Obesity, atelectasis P:   Cardiac status stable.Resuming attempts to transfer  WOB. PS 10 tolerated overnight.   Cardiac: Concerning for non-STEMI, certainly he has risk factors. I spoke with Dr. Arnoldo Morale of neurosurgery, who thinks anticoagulation and antiplatelet therapy are acceptable if needed. Add scheduled beta blocker, continue aspirin, add statin. Echo shows LVH, no wall motion abnormalities mentioned.  HEMATOLOGIC A:   Why does a man who has been acutely ill for several weeks have a Hgb of 15? Is he truly Pickwickian or do we need to reimage his kidney? Follow up once other issues are stabilized  P:  Continue Lovenox for DVT prophylaxis Trend CBC, monitor for bleeding   INFECTIOUS A:   Brain abscess P:   Antibiotics per infectious disease Do we still need decadron    ENDOCRINE A:   Diabetes type 2 P:   Sliding scale insulin  He is up 13 liters since admission, steroids and hypoalbuminemia contributing to fluid retention. Adding a daily dose of lasix  while monitoring his creatinine.      My cc time 32 minutes  Lars Masson, MD [facility] Pager: [phone] Cell: [phone] After 3pm or if no response, call [phone]   03/19/2017, 8:57 AM

## 2017-03-19 NOTE — Progress Notes (Signed)
Pt seen by trach team for consult.  No education needed at this time.  All equipment is at beside.  Will continue to follow for progression. 

## 2017-03-19 NOTE — Progress Notes (Signed)
CM Referral for LTAC.  Pt is eligible for [facility], per both area [place].    Met with pt's son, Shaheem, [place] to discuss possible transfer to [facility].  Offered choice for both area [place].  Son states he will research on the internet and discuss with his mother prior to making a decision.    Addendum:  4:15pm Pt's wife called this case manager back regarding LTAC; she states they would prefer [facility].  Referral made to admissions liaison with [facility]; they will initiate insurance authorization.  Admissions liaison to call wife on Monday, and set up a tour, per her request.    Reinaldo Raddle, RN, BSN  Trauma/Neuro ICU Case Manager [phone]

## 2017-03-19 NOTE — Progress Notes (Signed)
Patient given bisacodyl suppository to treat constipation. Patient had a bowel movement that was mucous and some small amount of blood. Patient and bowel sounds assessed. CCM MD notified. DG abd portable ordered.

## 2017-03-19 NOTE — Progress Notes (Signed)
    [facility]   Reason for visit: Follow up on brain abscess  Interval History: no acute changes.    Day 15 total antibiotics Day 15 ceftriaxone   Physical Exam: Constitutional:  Vitals:   03/19/17 0900 03/19/17 1000  BP: (!) 158/69 140/70  Pulse: 79 73  Resp: (!) 26 (!) 23  Temp:    SpO2: 97% 94%   patient does not respond Eyes: anicteric,open HENT: trach Respiratory: on vent Cardiovascular: RRR GI: soft, nt, nd Neuro: moves left arm, not right  Review of Systems: Unable to be assessed due to mental status  Lab Results  Component Value Date   WBC 21.4 (H) 03/18/2017   HGB 15.3 03/18/2017   HCT 47.8 03/18/2017   MCV 93.2 03/18/2017   PLT 259 03/18/2017    Lab Results  Component Value Date   CREATININE 2.34 (H) 03/19/2017   BUN 58 (H) 03/19/2017   NA 149 (H) 03/19/2017   K 4.6 03/19/2017   CL 117 (H) 03/19/2017   CO2 25 03/19/2017    Lab Results  Component Value Date   ALT 49 03/19/2017   AST 49 (H) 03/19/2017   ALKPHOS 135 (H) 03/19/2017     Microbiology: Recent Results (from the past 240 hour(s))  Culture, respiratory (NON-Expectorated)     Status: None   Collection Time: 03/11/17  2:00 PM  Result Value Ref Range Status   Specimen Description TRACHEAL ASPIRATE  Final   Special Requests NONE  Final   Gram Stain   Final    FEW WBC PRESENT,BOTH PMN AND MONONUCLEAR RARE SQUAMOUS EPITHELIAL CELLS PRESENT RARE GRAM POSITIVE COCCI IN PAIRS    Culture RARE CANDIDA ALBICANS  Final   Report Status 03/14/2017 FINAL  Final  Culture, bal-quantitative     Status: Abnormal   Collection Time: 03/11/17  4:29 PM  Result Value Ref Range Status   Specimen Description BRONCHIAL ALVEOLAR LAVAGE  Final   Special Requests NONE  Final   Gram Stain   Final    ABUNDANT WBC PRESENT, PREDOMINANTLY MONONUCLEAR NO ORGANISMS SEEN    Culture (A)  Final    40,000 COLONIES/mL Consistent with normal respiratory flora.   Report Status 03/14/2017  FINAL  Final    Impression/Plan:  1. Brain abscess - on ceftriaxone and no changes.  No new growth and final. Continue with long-term ceftriaxone   2.  Medication monitoring - checking CBC on ceftriaxone and no leukopenia, thrombocytopenia. WBC stable at 21, remains on decadron.    3.  BAL - cuilture with normal flora, no respiratory changes.  No treatment indicated.  Dr. Megan Salon available over the weekend if needed, otherwise I will follow up next week.

## 2017-03-20 LAB — BASIC METABOLIC PANEL
ANION GAP: 6 (ref 5–15)
BUN: 58 mg/dL — ABNORMAL HIGH (ref 6–20)
CALCIUM: 8.6 mg/dL — AB (ref 8.9–10.3)
CHLORIDE: 118 mmol/L — AB (ref 101–111)
CO2: 25 mmol/L (ref 22–32)
CREATININE: 2.23 mg/dL — AB (ref 0.61–1.24)
GFR calc non Af Amer: 31 mL/min — ABNORMAL LOW (ref >60)
GFR, EST AFRICAN AMERICAN: 35 mL/min — AB (ref >60)
Glucose, Bld: 140 mg/dL — ABNORMAL HIGH (ref 65–99)
Potassium: 4.4 mmol/L (ref 3.5–5.1)
SODIUM: 149 mmol/L — AB (ref 135–145)

## 2017-03-20 LAB — GLUCOSE, CAPILLARY
GLUCOSE-CAPILLARY: 148 mg/dL — AB (ref 65–99)
GLUCOSE-CAPILLARY: 155 mg/dL — AB (ref 65–99)
GLUCOSE-CAPILLARY: 185 mg/dL — AB (ref 65–99)
GLUCOSE-CAPILLARY: 173 mg/dL — AB (ref 65–99)
GLUCOSE-CAPILLARY: 184 mg/dL — AB (ref 65–99)
GLUCOSE-CAPILLARY: 163 mg/dL — AB (ref 65–99)
GLUCOSE-CAPILLARY: 164 mg/dL — AB (ref 65–99)
GLUCOSE-CAPILLARY: 169 mg/dL — AB (ref 65–99)
GLUCOSE-CAPILLARY: 170 mg/dL — AB (ref 65–99)
GLUCOSE-CAPILLARY: 177 mg/dL — AB (ref 65–99)
GLUCOSE-CAPILLARY: 166 mg/dL — AB (ref 65–99)
Glucose-Capillary: 132 mg/dL — ABNORMAL HIGH (ref 65–99)
Glucose-Capillary: 138 mg/dL — ABNORMAL HIGH (ref 65–99)
Glucose-Capillary: 139 mg/dL — ABNORMAL HIGH (ref 65–99)
Glucose-Capillary: 139 mg/dL — ABNORMAL HIGH (ref 65–99)
Glucose-Capillary: 152 mg/dL — ABNORMAL HIGH (ref 65–99)
Glucose-Capillary: 155 mg/dL — ABNORMAL HIGH (ref 65–99)
Glucose-Capillary: 171 mg/dL — ABNORMAL HIGH (ref 65–99)
Glucose-Capillary: 177 mg/dL — ABNORMAL HIGH (ref 65–99)
Glucose-Capillary: 183 mg/dL — ABNORMAL HIGH (ref 65–99)
Glucose-Capillary: 187 mg/dL — ABNORMAL HIGH (ref 65–99)
Glucose-Capillary: 189 mg/dL — ABNORMAL HIGH (ref 65–99)

## 2017-03-20 LAB — CBC WITH DIFFERENTIAL/PLATELET
Basophils Absolute: 0 10*3/uL (ref 0.0–0.1)
Basophils Relative: 0 %
Eosinophils Absolute: 0 10*3/uL (ref 0.0–0.7)
Eosinophils Relative: 0 %
HEMATOCRIT: 48.4 % (ref 39.0–52.0)
Hemoglobin: 15.4 g/dL (ref 13.0–17.0)
LYMPHS PCT: 4 %
Lymphs Abs: 0.9 10*3/uL (ref 0.7–4.0)
MCH: 29.3 pg (ref 26.0–34.0)
MCHC: 31.8 g/dL (ref 30.0–36.0)
MCV: 92.2 fL (ref 78.0–100.0)
MONO ABS: 1.1 10*3/uL — AB (ref 0.1–1.0)
MONOS PCT: 5 %
NEUTROS ABS: 21.5 10*3/uL — AB (ref 1.7–7.7)
Neutrophils Relative %: 91 %
Platelets: 270 10*3/uL (ref 150–400)
RBC: 5.25 MIL/uL (ref 4.22–5.81)
RDW: 15.4 % (ref 11.5–15.5)
WBC: 23.4 10*3/uL — ABNORMAL HIGH (ref 4.0–10.5)

## 2017-03-20 LAB — BLOOD GAS, ARTERIAL
Acid-Base Excess: 1.2 mmol/L (ref 0.0–2.0)
BICARBONATE: 24.9 mmol/L (ref 20.0–28.0)
Drawn by: 252031
FIO2: 40
LHR: 15 {breaths}/min
O2 Saturation: 94.6 %
PATIENT TEMPERATURE: 98.6
PEEP/CPAP: 5 cmH2O
Pressure control: 10 cmH2O
pCO2 arterial: 37.2 mmHg (ref 32.0–48.0)
pH, Arterial: 7.44 (ref 7.350–7.450)
pO2, Arterial: 76.5 mmHg — ABNORMAL LOW (ref 83.0–108.0)

## 2017-03-20 LAB — T3, FREE: T3, Free: 0.9 pg/mL — ABNORMAL LOW (ref 2.0–4.4)

## 2017-03-20 MED ORDER — FUROSEMIDE 10 MG/ML IJ SOLN
40.0000 mg | Freq: Once | INTRAMUSCULAR | Status: AC
  Administered 2017-03-20: 40 mg via INTRAVENOUS

## 2017-03-20 MED ORDER — FUROSEMIDE 10 MG/ML IJ SOLN
INTRAMUSCULAR | Status: AC
  Filled 2017-03-20: qty 4

## 2017-03-20 NOTE — Progress Notes (Signed)
PULMONARY / CRITICAL CARE MEDICINE   Name: Timothy Jordan MRN: [id number] DOB: [birth year]    ADMISSION DATE:  03/05/2017 CONSULTATION DATE: March 07, 2017  REFERRING MD: Dr. Cyndy Freeze  CHIEF COMPLAINT: Inability to protect airway, brain abscess  brief  61 year old male admitted with a brain abscess, who developed encephalopathy requiring intubation.  Pulmonary and critical care medicine consulted for ventilator management.  He underwent percutaneous tracheostomy 11/5. Early 11/7 he had an episode of SVT. He was started on amiodarone and has maintained NSR since..  EKG does not show ischemic changes to me, but his first troponin bumped to elevated at 1.8. He is still supported on mechanical ventilation, but tolerated BiPAP 10/10 last night.  He has spontaneous eye opening, but still does not interact with me at all. Constipation responded to suppository this morning.   STUDIES:  ECHO 10/27 >> study stopped prematurely due to pt movement, mild LVH, LVEF 55-60% CT Head 10/28 >> Stable to slight enlargement of left basal ganglia masses compatible with abscesses on MRI. Worsening surrounding edema with 5 mm of rightward midline shift Renal US 10/29 >> complex left-sided renal cysts, no obstructive uropathy, trace perinephric fluid is seen about both kidneys   CULTURES: BCx2 10/26 >> negative Sputum 10/28 >> normal flora   UA 10/26 >> negative HIV 10/26 >> negative  BCx2 10/28 >> GPC pairs 1/2  Grew strep vitidans BAL 11/1 >> rare yeast  ANTIBIOTICS: Ceftriaxone 10/26 >> Metronidazole 10/26 >>discontinued Vancomycin 10/26 >> 10/27  SIGNIFICANT EVENTS: 10/28  Progressive decline in MS requiring intubation 10/29  Fever and shock overnight, requiring addition of phenylephrine gtt.  Brain abscess aspiration 10/30  Off vasopressors 10/31  No weaning, no change in neuro exam 11/02  Increased O2 needs overnight, resolved with increased PEEP  LINES/TUBES: ETT 10/28 >>  PICC 11/1 (IR)  >>     SUBJECTIVE/OVERNIGHT/INTERVAL HX 11/9 - s/p trach. Does not move on right - flaccid. Does not follow commands. Purposeful on left but not to command. Afebrile. Overall stable.  On d16 rocephin. Not on pressors. Not on HD. Not on CRRT. Not on sedation gtt. On TF via cortrak; had BM   Yesterday. Tolerating TF well   VITAL SIGNS: BP (!) 142/69   Pulse 76   Temp 98.4 F (36.9 C) (Oral)   Resp (!) 24   Ht _0  (1.778 m) Comment: per family  Wt 114.8 kg (253 lb 1.4 oz)   SpO2 97%   BMI 36.31 kg/m   HEMODYNAMICS:    VENTILATOR SETTINGS: Vent Mode: PSV;CPAP FiO2 (%):  [40 %] 40 % Set Rate:  [15 bmp] 15 bmp PEEP:  [8 cmH20] 8 cmH20 Pressure Support:  [10 cmH20] 10 cmH20 Plateau Pressure:  [16 cmH20] 16 cmH20  INTAKE / OUTPUT: I/O last 3 completed shifts: In: 6002.2 [I.V.:699.2; NG/GT:4830; IV [id number]:638] Out: 9373 [Urine:4935]  PHYSICAL EXAMINATION:  General Appearance:    Looks critically ill  Head:    Normocephalic, without obvious abnormality, atraumatic  Eyes:    PERRL - yes, conjunctiva/corneas - clear      Ears:    Normal external ear canals, both ears  Nose:   NG tube - yes  Throat:  ETT TUBE - TRACH + , OG tube - no  Neck:   Supple,  No enlargement/tenderness/nodules     Lungs:     Clear to auscultation bilaterally, Ventilator   Synchrony - yes  Chest wall:    No deformity  Heart:  S1 and S2 normal, no murmur, CVP - no.  Pressors - no  Abdomen:     Soft, no masses, no organomegaly  Genitalia:    Not done  Rectal:   not done  Extremities:   Extremities- intact in boots     Skin:   Intact in exposed areas .     Neurologic:   Sedation - none . RASs -3 ut occ will reach for trach on one side       LABS:  PULMONARY Recent Labs  Lab 03/18/17 0342 03/20/17 0330  PHART 7.456* 7.440  PCO2ART 37.1 37.2  PO2ART 83.6 76.5*  HCO3 25.8 24.9  O2SAT 96.0 94.6    CBC Recent Labs  Lab 03/16/17 0334 03/18/17 0415 03/20/17 0233  HGB 15.1 15.3  15.4  HCT 48.1 47.8 48.4  WBC 20.9* 21.4* 23.4*  PLT 255 259 270    COAGULATION Recent Labs  Lab 03/14/17 0923  INR 1.38    CARDIAC   Recent Labs  Lab 03/16/17 0544 03/16/17 1150 03/16/17 1739  TROPONINI 1.83* 1.79* 1.65*   No results for input(s): PROBNP in the last 168 hours.   CHEMISTRY Recent Labs  Lab 03/15/17 0441 03/16/17 0334 03/18/17 0415 03/19/17 0414 03/20/17 0233  NA 155* 153* 152* 149* 149*  K 4.3 4.4 4.0 4.6 4.4  CL 121* 119* 119* 117* 118*  CO2 _0 GLUCOSE 164* 322* 135* 164* 140*  BUN 55* 66* 56* 58* 58*  CREATININE 2.54* 2.57* 2.44* 2.34* 2.23*  CALCIUM 8.9 8.8* 8.7* 8.6* 8.6*  MG  --  2.8* 2.3  --   --   PHOS  --  3.2  --   --   --    Estimated Creatinine Clearance: 45.3 mL/min (A) (by C-G formula based on SCr of 2.23 mg/dL (H)).   LIVER Recent Labs  Lab 03/14/17 0923 03/16/17 0334 03/19/17 0414  AST  --  30 49*  ALT  --  21 49  ALKPHOS  --  106 135*  BILITOT  --  0.3 0.3  PROT  --  6.6 6.1*  ALBUMIN  --  1.8* 1.7*  INR 1.38  --   --      INFECTIOUS No results for input(s): LATICACIDVEN, PROCALCITON in the last 168 hours.   ENDOCRINE CBG (last 3)  Recent Labs    03/20/17 1031 03/20/17 1134 03/20/17 1246  GLUCAP 183* 171* 163*         IMAGING x48h  - image(s) personally visualized  -   highlighted in bold Dg Abd Portable 1v  Result Date: 03/19/2017 CLINICAL DATA:  Obstipation. EXAM: PORTABLE ABDOMEN - 1 VIEW COMPARISON:  03/07/2017 FINDINGS: Enteric tube is present with tip over the right upper quadrant likely within the distal stomach or proximal duodenum. Bowel gas pattern is nonobstructive with mild fecal retention throughout the colon. No dilated small bowel loops. No free peritoneal air. Mild degenerate change of the spine. IMPRESSION: Nonobstructive bowel gas pattern. Enteric tube with tip in the right upper quadrant likely over the distal stomach or proximal duodenum. Electronically Signed   By:  Marin Olp M.D.   On: 03/19/2017 24:28      61 year old male with diabetes admitted with a brain abscess. Persistent respiratory failure and new SVT with elevated troponin 11/7.   ASSESSMENT / PLAN:  NEUROLOGIC A:   Brain abscess Strep Viridans was isolated from the brain abscess Acute encephalopathy Mental status remains quite poor, worse than I  would expect from CT findings alone. EEG 11/7 negative for seizures. Status post brain abscess drainage  03/20/2017  - remains unresponsive for most parts  P: On high-dose Rocephin  for his brain abscess.  .  Await free T4 for potential secondary hypothyroidism.    PULMONARY A: Acute respiratory failure with hypoxemia Obesity, atelectasis  03/20/2017 s/p trach and doing sbt    P:   PS 10 tolerated overnight.   Cardiac: Concerning for non-STEMI, certainly he has risk factors. PCCM spoke with Dr. Arnoldo Morale of neurosurgery, who thinks anticoagulation and antiplatelet therapy are acceptable if needed. Add scheduled beta blocker, continue aspirin, add statin. Echo shows LVH, no wall motion abnormalities mentioned.  HEMATOLOGIC Recent Labs  Lab 03/16/17 0334 03/18/17 0415 03/20/17 0233  HGB 15.1 15.3 15.4  HCT 48.1 47.8 48.4  WBC 20.9* 21.4* 23.4*  PLT 255 259 270    A:   Why does a man who has been acutely ill for several weeks have a Hgb of 15? Is he truly [place] or do we need to reimage his kidney? Follow up once other issues are stabilized  P:  Continue Lovenox for DVT prophylaxis Trend CBC, monitor for bleeding   INFECTIOUS A:   Brain abscess P:   Antibiotics per infectious disease Do we still need decadron ? Need ID inputu    ENDOCRINE A:   Diabetes type 2 P:   Sliding scale insulin - needing insuling gt on icu hyperglucemiua protocl   RENAL A: AKI improving. Volume overloaded  P LAsix x 1 repeat on dermatitis   FAMILY and DISPO 03/20/2017 0 wife and son updated at bedsid. Move to sDU  With  ccm consult 2X/week and TRH primary - signed off to Dr Thereasa Solo        Dr. Brand Males, M.D., [facility].C.P Pulmonary and Critical Care Medicine Staff Physician [facility] Pager: [phone], If no answer or between  15:00h - 7:00h: call [phone]  03/20/2017 2:03 PM

## 2017-03-20 NOTE — Plan of Care (Signed)
Pts family educated on plan of care and verbalized understanding.  Pts vss.  Adequate nutrition and I and o balance.  BM noted today.

## 2017-03-21 ENCOUNTER — Other Ambulatory Visit: Payer: Self-pay

## 2017-03-21 LAB — GLUCOSE, CAPILLARY
GLUCOSE-CAPILLARY: 165 mg/dL — AB (ref 65–99)
GLUCOSE-CAPILLARY: 159 mg/dL — AB (ref 65–99)
GLUCOSE-CAPILLARY: 156 mg/dL — AB (ref 65–99)
GLUCOSE-CAPILLARY: 161 mg/dL — AB (ref 65–99)
GLUCOSE-CAPILLARY: 224 mg/dL — AB (ref 65–99)
GLUCOSE-CAPILLARY: 153 mg/dL — AB (ref 65–99)
GLUCOSE-CAPILLARY: 136 mg/dL — AB (ref 65–99)
GLUCOSE-CAPILLARY: 183 mg/dL — AB (ref 65–99)
GLUCOSE-CAPILLARY: 146 mg/dL — AB (ref 65–99)
Glucose-Capillary: 141 mg/dL — ABNORMAL HIGH (ref 65–99)
Glucose-Capillary: 174 mg/dL — ABNORMAL HIGH (ref 65–99)
Glucose-Capillary: 141 mg/dL — ABNORMAL HIGH (ref 65–99)
Glucose-Capillary: 133 mg/dL — ABNORMAL HIGH (ref 65–99)
Glucose-Capillary: 160 mg/dL — ABNORMAL HIGH (ref 65–99)
Glucose-Capillary: 179 mg/dL — ABNORMAL HIGH (ref 65–99)
Glucose-Capillary: 148 mg/dL — ABNORMAL HIGH (ref 65–99)
Glucose-Capillary: 160 mg/dL — ABNORMAL HIGH (ref 65–99)
Glucose-Capillary: 172 mg/dL — ABNORMAL HIGH (ref 65–99)
Glucose-Capillary: 184 mg/dL — ABNORMAL HIGH (ref 65–99)
Glucose-Capillary: 188 mg/dL — ABNORMAL HIGH (ref 65–99)
Glucose-Capillary: 155 mg/dL — ABNORMAL HIGH (ref 65–99)
Glucose-Capillary: 147 mg/dL — ABNORMAL HIGH (ref 65–99)

## 2017-03-21 MED ORDER — ATORVASTATIN CALCIUM 40 MG PO TABS
40.0000 mg | ORAL_TABLET | Freq: Every day | ORAL | Status: DC
  Administered 2017-03-21: 40 mg
  Filled 2017-03-21: qty 1

## 2017-03-21 MED ORDER — COSYNTROPIN 0.25 MG IJ SOLR: 0.2500 mg | Freq: Once | INTRAMUSCULAR | Status: DC

## 2017-03-21 MED ORDER — DEXAMETHASONE SODIUM PHOSPHATE 4 MG/ML IJ SOLN
4.0000 mg | Freq: Two times a day (BID) | INTRAMUSCULAR | Status: DC
  Administered 2017-03-21: 4 mg via INTRAVENOUS
  Filled 2017-03-21: qty 1

## 2017-03-21 MED ORDER — SODIUM CHLORIDE 0.9 % IV SOLN: INTRAVENOUS | Status: DC

## 2017-03-21 MED ORDER — ACETAMINOPHEN 650 MG RE SUPP: 650.0000 mg | Freq: Four times a day (QID) | RECTAL | Status: DC | PRN

## 2017-03-21 MED ORDER — PANTOPRAZOLE SODIUM 40 MG PO PACK
40.0000 mg | PACK | Freq: Every day | ORAL | Status: DC
  Administered 2017-03-22 – 2017-03-24 (×3): 40 mg
  Filled 2017-03-21 (×3): qty 20

## 2017-03-21 MED ORDER — ACETAMINOPHEN 325 MG PO TABS: 650.0000 mg | ORAL_TABLET | Freq: Four times a day (QID) | ORAL | Status: DC | PRN

## 2017-03-21 MED ORDER — AMIODARONE HCL 200 MG PO TABS
200.0000 mg | ORAL_TABLET | Freq: Two times a day (BID) | ORAL | Status: DC
  Administered 2017-03-21 – 2017-03-22 (×2): 200 mg
  Filled 2017-03-21 (×2): qty 1

## 2017-03-21 MED ORDER — ASPIRIN 81 MG PO CHEW
81.0000 mg | CHEWABLE_TABLET | Freq: Every day | ORAL | Status: DC
  Administered 2017-03-22 – 2017-03-24 (×3): 81 mg
  Filled 2017-03-21 (×3): qty 1

## 2017-03-21 NOTE — Progress Notes (Signed)
[facility] TEAM 1 - Stepdown/ICU TEAM  Johnny Bridge  NUU:[id number] DOB: [birth year] DOA: 03/05/2017 PCP: Patient, No Pcp Per    Brief Narrative:  61 year old male w/ hx of DM and HTN and dental extraction 30 days prior who was admitted with a brain abscess, who developed encephalopathy requiring intubation.  He underwent percutaneous tracheostomy 11/5. Early 11/7 he had an episode of SVT. He was started on amiodarone and has maintained NSR since.  Significant Events: 10/26  admit w/ AMS - ED w/u revealed a brain abscess 10/28 Progressive decline in MS > intubation 10/29 Fever and shock overnight, requiring addition of phenylephrine gtt - Brain abscess aspiration 10/30 Off vasopressors 10/31 No weaning, no change in neuro exam 11/02 Increased O2 needs overnight, resolved with increased PEEP 11/11  TRH assumed care   Subjective: Pt is non-communicative.  He does not appear to be uncomfortable and there is no evidence of respiratory distress.    Assessment & Plan:  L basal ganglia Brain abscess - Strep Viridans  S/p drainage per NS - abx as directed by ID   Acute encephalopathy EEG 11/7 negative for seizure - unclear to what extent pt will recover, but recovery will clearly be a protracted issue if it occurs   Acute respiratory failure with hypoxemia Trach/vent care per PCCM  Obesity - Body mass index is 36.41 kg/m.  Possible adrenal insufficiency  Random AM cortisol very low at 1.5 on 11/09 but this is in presence of high dose ongoing decadron tx - will need to be re-evaluated when pt off steroid tx   ?Hyperthyroidism TSH low at 0.233, but FT4 normal - will need recheck when not acutely ill   DM2 CBG well controlled on insulin gtt - wean steroids and gtt as able   AKI Renal fxn appears to be slowly improving  Recent Labs  Lab 03/15/17 0441 03/16/17 0334 03/18/17 0415 03/19/17 0414 03/20/17 0233  CREATININE 2.54* 2.57* 2.44* 2.34* 2.23*    HTN  DVT  prophylaxis: lovenox  Code Status: FULL CODE Family Communication: spoke w/ wife at bedside  Disposition Plan: awaiting possible LTACH transfer   Consultants:  PCCM ID Neurosurgery   Antimicrobials:  Ceftriaxone 10/26 > Metronidazole 10/26  Vancomycin 10/26 > 10/27  Objective: Blood pressure (!) 144/60, pulse 78, temperature 98.7 F (37.1 C), temperature source Oral, resp. rate (!) 25, height _0  (1.778 m), weight 115.1 kg (253 lb 12 oz), SpO2 95 %.  Intake/Output Summary (Last 24 hours) at 03/21/2017 1527 Last data filed at 03/21/2017 1500 Gross per 24 hour  Intake 4929.12 ml  Output 4600 ml  Net 329.12 ml   Filed Weights   03/19/17 0452 03/20/17 0246 03/21/17 0437  Weight: 117.2 kg (258 lb 6.1 oz) 114.8 kg (253 lb 1.4 oz) 115.1 kg (253 lb 12 oz)    Examination: General: No acute respiratory distress Lungs: Clear to auscultation bilaterally without wheezes or crackles Cardiovascular: Regular rate and rhythm without murmur gallop or rub normal S1 and S2 Abdomen: Nondistended, soft, bowel sounds positive, no ascites, no appreciable mass Extremities: No significant cyanosis, clubbing, or edema bilateral lower extremities  CBC: Recent Labs  Lab 03/15/17 0441 03/16/17 0334 03/18/17 0415 03/20/17 0233  WBC 17.2* 20.9* 21.4* 23.4*  NEUTROABS 15.4* 18.2* 19.5* 21.5*  HGB 15.0 15.1 15.3 15.4  HCT 47.7 48.1 47.8 48.4  MCV 94.3 93.6 93.2 92.2  PLT 236 255 259 347   Basic Metabolic Panel: Recent Labs  Lab 03/15/17 0441 03/16/17 0334  03/18/17 0415 03/19/17 0414 03/20/17 0233  NA 155* 153* 152* 149* 149*  K 4.3 4.4 4.0 4.6 4.4  CL 121* 119* 119* 117* 118*  CO2 _0 GLUCOSE 164* 322* 135* 164* 140*  BUN 55* 66* 56* 58* 58*  CREATININE 2.54* 2.57* 2.44* 2.34* 2.23*  CALCIUM 8.9 8.8* 8.7* 8.6* 8.6*  MG  --  2.8* 2.3  --   --   PHOS  --  3.2  --   --   --    GFR: Estimated Creatinine Clearance: 45.3 mL/min (A) (by C-G formula based on SCr of 2.23  mg/dL (H)).  Liver Function Tests: Recent Labs  Lab 03/16/17 0334 03/19/17 0414  AST 30 49*  ALT 21 49  ALKPHOS 106 135*  BILITOT 0.3 0.3  PROT 6.6 6.1*  ALBUMIN 1.8* 1.7*    Cardiac Enzymes: Recent Labs  Lab 03/16/17 0544 03/16/17 1150 03/16/17 1739  TROPONINI 1.83* 1.79* 1.65*    HbA1C: Hgb A1c MFr Bld  Date/Time Value Ref Range Status  03/12/2017 09:33 AM 9.1 (H) 4.8 - 5.6 % Final    Comment:    (NOTE) Pre diabetes:          5.7%-6.4% Diabetes:              >6.4% Glycemic control for   <7.0% adults with diabetes   03/06/2017 12:03 PM 9.9 (H) 4.8 - 5.6 % Final    Comment:    (NOTE) Pre diabetes:          5.7%-6.4% Diabetes:              >6.4% Glycemic control for   <7.0% adults with diabetes     CBG: Recent Labs  Lab 03/21/17 1113 03/21/17 1212 03/21/17 1312 03/21/17 1404 03/21/17 1504  GLUCAP 179* 148* 160* 153* 136*    Recent Results (from the past 240 hour(s))  Culture, bal-quantitative     Status: Abnormal   Collection Time: 03/11/17  4:29 PM  Result Value Ref Range Status   Specimen Description BRONCHIAL ALVEOLAR LAVAGE  Final   Special Requests NONE  Final   Gram Stain   Final    ABUNDANT WBC PRESENT, PREDOMINANTLY MONONUCLEAR NO ORGANISMS SEEN    Culture (A)  Final    40,000 COLONIES/mL Consistent with normal respiratory flora.   Report Status 03/14/2017 FINAL  Final     Scheduled Meds: . albuterol  2.5 mg Nebulization TID  . amiodarone  200 mg Oral BID  . aspirin  81 mg Oral Daily  . atorvastatin  40 mg Oral q1800  . chlorhexidine gluconate (MEDLINE KIT)  15 mL Mouth Rinse BID  . Chlorhexidine Gluconate Cloth  6 each Topical Daily  . dexamethasone  4 mg Intravenous Q6H  . enoxaparin (LOVENOX) injection  40 mg Subcutaneous Q24H  . feeding supplement (PRO-STAT SUGAR FREE 64)  30 mL Per Tube Daily  . free water  300 mL Per Tube Q4H  . mouth rinse  15 mL Mouth Rinse 10 times per day  . metoprolol tartrate  2.5 mg Intravenous Q6H   . mupirocin cream   Topical BID  . [START ON 03/22/2017] pantoprazole sodium  40 mg Per Tube Daily  . sennosides  5 mL Per Tube QHS  . sodium chloride flush  10-40 mL Intracatheter Q12H     LOS: 16 days   Cherene Altes, MD [facility] Office  [phone] Pager - Text Page per Amion as per below:  On-Call/Text Page:      [email or web address]      password [email or web address]  If 7PM-7AM, please contact night-coverage [email or web address] Password [email or web address] 03/21/2017, 3:27 PM

## 2017-03-22 ENCOUNTER — Inpatient Hospital Stay (HOSPITAL_COMMUNITY)

## 2017-03-22 DIAGNOSIS — J181 Lobar pneumonia, unspecified organism: Secondary | ICD-10-CM

## 2017-03-22 LAB — COMPREHENSIVE METABOLIC PANEL
ALT: 142 U/L — ABNORMAL HIGH (ref 17–63)
AST: 79 U/L — ABNORMAL HIGH (ref 15–41)
Albumin: 1.8 g/dL — ABNORMAL LOW (ref 3.5–5.0)
Alkaline Phosphatase: 181 U/L — ABNORMAL HIGH (ref 38–126)
Anion gap: 7 (ref 5–15)
BUN: 56 mg/dL — ABNORMAL HIGH (ref 6–20)
CHLORIDE: 116 mmol/L — AB (ref 101–111)
CO2: 26 mmol/L (ref 22–32)
Calcium: 8.6 mg/dL — ABNORMAL LOW (ref 8.9–10.3)
Creatinine, Ser: 1.9 mg/dL — ABNORMAL HIGH (ref 0.61–1.24)
GFR, EST AFRICAN AMERICAN: 43 mL/min — AB (ref >60)
GFR, EST NON AFRICAN AMERICAN: 37 mL/min — AB (ref >60)
Glucose, Bld: 181 mg/dL — ABNORMAL HIGH (ref 65–99)
POTASSIUM: 4.4 mmol/L (ref 3.5–5.1)
Sodium: 149 mmol/L — ABNORMAL HIGH (ref 135–145)
Total Bilirubin: 0.3 mg/dL (ref 0.3–1.2)
Total Protein: 6 g/dL — ABNORMAL LOW (ref 6.5–8.1)

## 2017-03-22 LAB — GLUCOSE, CAPILLARY
GLUCOSE-CAPILLARY: 157 mg/dL — AB (ref 65–99)
GLUCOSE-CAPILLARY: 184 mg/dL — AB (ref 65–99)
GLUCOSE-CAPILLARY: 157 mg/dL — AB (ref 65–99)
GLUCOSE-CAPILLARY: 168 mg/dL — AB (ref 65–99)
GLUCOSE-CAPILLARY: 162 mg/dL — AB (ref 65–99)
GLUCOSE-CAPILLARY: 141 mg/dL — AB (ref 65–99)
GLUCOSE-CAPILLARY: 152 mg/dL — AB (ref 65–99)
GLUCOSE-CAPILLARY: 143 mg/dL — AB (ref 65–99)
GLUCOSE-CAPILLARY: 150 mg/dL — AB (ref 65–99)
GLUCOSE-CAPILLARY: 266 mg/dL — AB (ref 65–99)
Glucose-Capillary: 122 mg/dL — ABNORMAL HIGH (ref 65–99)
Glucose-Capillary: 134 mg/dL — ABNORMAL HIGH (ref 65–99)
Glucose-Capillary: 155 mg/dL — ABNORMAL HIGH (ref 65–99)
Glucose-Capillary: 157 mg/dL — ABNORMAL HIGH (ref 65–99)
Glucose-Capillary: 162 mg/dL — ABNORMAL HIGH (ref 65–99)
Glucose-Capillary: 186 mg/dL — ABNORMAL HIGH (ref 65–99)
Glucose-Capillary: 211 mg/dL — ABNORMAL HIGH (ref 65–99)

## 2017-03-22 LAB — POCT I-STAT 3, ART BLOOD GAS (G3+)
Acid-Base Excess: 2 mmol/L (ref 0.0–2.0)
Acid-Base Excess: 3 mmol/L — ABNORMAL HIGH (ref 0.0–2.0)
Bicarbonate: 26.6 mmol/L (ref 20.0–28.0)
Bicarbonate: 27.2 mmol/L (ref 20.0–28.0)
O2 SAT: 97 %
O2 Saturation: 99 %
PCO2 ART: 38.7 mmHg (ref 32.0–48.0)
PCO2 ART: 38.5 mmHg (ref 32.0–48.0)
PH ART: 7.456 — AB (ref 7.350–7.450)
PO2 ART: 83 mmHg (ref 83.0–108.0)
Patient temperature: 98.6
TCO2: 28 mmol/L (ref 22–32)
TCO2: 28 mmol/L (ref 22–32)
pH, Arterial: 7.445 (ref 7.350–7.450)
pO2, Arterial: 116 mmHg — ABNORMAL HIGH (ref 83.0–108.0)

## 2017-03-22 LAB — CBC
HEMATOCRIT: 48.4 % (ref 39.0–52.0)
HEMOGLOBIN: 15.3 g/dL (ref 13.0–17.0)
MCH: 29 pg (ref 26.0–34.0)
MCHC: 31.6 g/dL (ref 30.0–36.0)
MCV: 91.7 fL (ref 78.0–100.0)
Platelets: 275 10*3/uL (ref 150–400)
RBC: 5.28 MIL/uL (ref 4.22–5.81)
RDW: 15.3 % (ref 11.5–15.5)
WBC: 24.4 10*3/uL — AB (ref 4.0–10.5)

## 2017-03-22 LAB — PROCALCITONIN: PROCALCITONIN: 0.2 ng/mL

## 2017-03-22 MED ORDER — ACETAMINOPHEN 500 MG PO TABS: 500.0000 mg | ORAL_TABLET | Freq: Four times a day (QID) | ORAL | Status: DC | PRN

## 2017-03-22 MED ORDER — DEXAMETHASONE SODIUM PHOSPHATE 4 MG/ML IJ SOLN
2.0000 mg | Freq: Two times a day (BID) | INTRAMUSCULAR | Status: DC
  Administered 2017-03-22 – 2017-03-24 (×6): 2 mg via INTRAVENOUS
  Filled 2017-03-22 (×7): qty 1

## 2017-03-22 MED ORDER — DEXTROSE 5 % IV SOLN
2.0000 g | Freq: Two times a day (BID) | INTRAVENOUS | Status: DC
  Administered 2017-03-22 – 2017-03-23 (×3): 2 g via INTRAVENOUS
  Filled 2017-03-22 (×4): qty 2

## 2017-03-22 MED ORDER — INSULIN ASPART 100 UNIT/ML ~~LOC~~ SOLN
0.0000 [IU] | SUBCUTANEOUS | Status: DC
  Administered 2017-03-22: 7 [IU] via SUBCUTANEOUS
  Administered 2017-03-22: 11 [IU] via SUBCUTANEOUS
  Administered 2017-03-22: 4 [IU] via SUBCUTANEOUS
  Administered 2017-03-23 (×3): 3 [IU] via SUBCUTANEOUS
  Administered 2017-03-23: 4 [IU] via SUBCUTANEOUS
  Administered 2017-03-23: 7 [IU] via SUBCUTANEOUS
  Administered 2017-03-24 (×4): 4 [IU] via SUBCUTANEOUS
  Administered 2017-03-24: 3 [IU] via SUBCUTANEOUS

## 2017-03-22 MED ORDER — FREE WATER
400.0000 mL | Status: DC
  Administered 2017-03-22 – 2017-03-24 (×15): 400 mL

## 2017-03-22 MED ORDER — METOPROLOL TARTRATE 25 MG/10 ML ORAL SUSPENSION
25.0000 mg | Freq: Two times a day (BID) | ORAL | Status: DC
  Administered 2017-03-22 – 2017-03-24 (×6): 25 mg
  Filled 2017-03-22 (×6): qty 10

## 2017-03-22 MED ORDER — INSULIN GLARGINE 100 UNIT/ML ~~LOC~~ SOLN: 70.0000 [IU] | Freq: Every day | SUBCUTANEOUS | Status: DC

## 2017-03-22 MED ORDER — ACETAMINOPHEN 650 MG RE SUPP: 325.0000 mg | Freq: Four times a day (QID) | RECTAL | Status: DC | PRN

## 2017-03-22 MED ORDER — LEVOFLOXACIN IN D5W 750 MG/150ML IV SOLN
750.0000 mg | INTRAVENOUS | Status: DC
  Administered 2017-03-22: 750 mg via INTRAVENOUS
  Filled 2017-03-22: qty 150

## 2017-03-22 MED ORDER — INSULIN GLARGINE 100 UNIT/ML ~~LOC~~ SOLN
22.0000 [IU] | Freq: Every day | SUBCUTANEOUS | Status: DC
  Filled 2017-03-22 (×2): qty 0.22

## 2017-03-22 MED ORDER — INSULIN GLARGINE 100 UNIT/ML ~~LOC~~ SOLN
70.0000 [IU] | Freq: Every day | SUBCUTANEOUS | Status: DC
  Administered 2017-03-22 – 2017-03-23 (×2): 70 [IU] via SUBCUTANEOUS
  Filled 2017-03-22 (×2): qty 0.7

## 2017-03-22 NOTE — Progress Notes (Signed)
PULMONARY / CRITICAL CARE MEDICINE   Name: Timothy Jordan MRN: [id number] DOB: November 20, 1955    ADMISSION DATE:  03/05/2017 CONSULTATION DATE: 03/07/17  CHIEF COMPLAINT: Brain abscess, Ventilator management  HISTORY OF PRESENT ILLNESS:   35yoM with h/ol DM, admitted 10/26 with AMS and found to have brain abscesses (strep viridans) who developed acute encephalopathy and required intubation. He is s/p percutaneous tracheostomy 11/5. Has episode of SVT on 11/7 started on Amiodarone. He has continued to require mechanical ventilation. Tolerated PSV trial for 11hrs (8am-7pm) on 03/21/17 and is back on PSV trial again today. Has not attempted any trach collar yet. Has copious thick creamy secretions from trach today. Remains on insulin gtt for hyperglycemia.   PAST MEDICAL HISTORY :  He  has a past medical history of Diabetes mellitus without complication ([employer]).  PAST SURGICAL HISTORY: He  has a past surgical history that includes Hernia repair; Achilles tendon repair; IR Fluoro Guide CV Line Right (03/11/2017); IR US Guide Vasc Access Right (03/11/2017); and FRAMLESS STERIOTACTIC BIOPSY WITH STEALTH (Left, 03/08/2017).  No Known Allergies  No current facility-administered medications on file prior to encounter.    Current Outpatient Medications on File Prior to Encounter  Medication Sig  . aspirin EC 81 MG tablet Take 81 mg by mouth daily.  . insulin glargine (LANTUS) 100 UNIT/ML injection Inject 70 Units into the skin every morning.  Marland Kitchen LIPITOR 40 MG tablet Take 1 tablet by mouth at bedtime.  Marland Kitchen lisinopril (PRINIVIL,ZESTRIL) 20 MG tablet Take 1 tablet by mouth daily.  . metFORMIN (GLUCOPHAGE) 1000 MG tablet Take 1 tablet by mouth 2 (two) times daily.   FAMILY HISTORY:  His has no family status information on file.   SOCIAL HISTORY: He  reports that he has quit smoking. he has never used smokeless tobacco. He reports that he does not drink alcohol.  REVIEW OF SYSTEMS:   Review of Systems   Unable to perform ROS: Critical illness    VITAL SIGNS: BP (!) 149/70   Pulse 77   Temp 98.8 F (37.1 C) (Axillary)   Resp 20   Ht _0  (1.778 m) Comment: per family  Wt 112.7 kg (248 lb 7.3 oz)   SpO2 97%   BMI 35.65 kg/m   VENTILATOR SETTINGS: Vent Mode: PSV FiO2 (%):  [40 %] 40 % Set Rate:  [15 bmp] 15 bmp PEEP:  [8 cmH20] 8 cmH20 Pressure Support:  [10 cmH20] 10 cmH20 Plateau Pressure:  [17 cmH20-18 cmH20] 17 cmH20  INTAKE / OUTPUT: I/O last 3 completed shifts: In: 5433.4 [I.V.:638.4; NG/GT:3180; IV [id number]:0037] Out: 4580 [Urine:4580]  PHYSICAL EXAMINATION: General: trach on vent, lying in bed, critically ill Neuro: awake, not obeying commands  HEENT: 6 Shiley trach with thick creamy secretions around trach and also being suctioned from within trach.  Cardiovascular: RRR no m/r/g Lungs: CTA b/l Abdomen: Soft NTND Musculoskeletal: Trace to 1+ BLE edema Skin: no rashes   LABS:  BMET Recent Labs  Lab 03/19/17 0414 03/20/17 0233 03/22/17 0500  NA 149* 149* 149*  K 4.6 4.4 4.4  CL 117* 118* 116*  CO2 _1 BUN 58* 58* 56*  CREATININE 2.34* 2.23* 1.90*  GLUCOSE 164* 140* 181*    Electrolytes Recent Labs  Lab 03/16/17 0334 03/18/17 0415 03/19/17 0414 03/20/17 0233 03/22/17 0500  CALCIUM 8.8* 8.7* 8.6* 8.6* 8.6*  MG 2.8* 2.3  --   --   --   PHOS 3.2  --   --   --   --  CBC Recent Labs  Lab 03/18/17 0415 03/20/17 0233 03/22/17 0500  WBC 21.4* 23.4* 24.4*  HGB 15.3 15.4 15.3  HCT 47.8 48.4 48.4  PLT 259 270 275    Coag's No results for input(s): APTT, INR in the last 168 hours.  Sepsis Markers No results for input(s): LATICACIDVEN, PROCALCITON, O2SATVEN in the last 168 hours.  ABG Recent Labs  Lab 03/20/17 0330 03/22/17 0908 03/22/17 1058  PHART 7.440 7.445 7.456*  PCO2ART 37.2 38.7 38.5  PO2ART 76.5* 116.0* 83.0    Liver Enzymes Recent Labs  Lab 03/16/17 0334 03/19/17 0414 03/22/17 0500  AST 30 49* 79*   ALT 21 49 142*  ALKPHOS 106 135* 181*  BILITOT 0.3 0.3 0.3  ALBUMIN 1.8* 1.7* 1.8*    Cardiac Enzymes Recent Labs  Lab 03/16/17 0544 03/16/17 1150 03/16/17 1739  TROPONINI 1.83* 1.79* 1.65*    Glucose Recent Labs  Lab 03/22/17 0437 03/22/17 0528 03/22/17 0625 03/22/17 0808 03/22/17 1000 03/22/17 1117  GLUCAP 157* 168* 186* 162* 141* 152*    Imaging Dg Chest Port 1 View  Result Date: 03/22/2017 CLINICAL DATA:  Brain abscess.  Worsening endotracheal secretions. EXAM: PORTABLE CHEST 1 VIEW COMPARISON:  03/15/2017 FINDINGS: Tracheostomy remains in place. Right sided PICC tip at the SVC RA junction. Patchy infiltrate persists in both lower lobes right worse than left, similar or slightly improved when compared to the study of 1 week ago. Upper lungs remain clear. IMPRESSION: Persistent patchy lower lobe infiltrates right worse than left, probably slightly improved compared to the study of 1 week ago. Electronically Signed   By: Nelson Chimes M.D.   On: 03/22/2017 10:43   STUDIES: ECHO 10/27 >> study stopped prematurely due to pt movement, mild LVH, LVEF 55-60% CT Head 10/28 >> Stable to slight enlargement of left basal ganglia masses compatible with abscesses on MRI. Worsening surrounding edema with 5 mm of rightward midline shift Renal US 10/29 >> complex left-sided renal cysts, no obstructive uropathy, trace perinephric fluid is seen about both kidneys  CULTURES: BCx2 10/26 >> negative Sputum 10/28 >>normal flora  UA 10/26 >> negative HIV 10/26 >>negative  BCx2 10/28 >> GPC pairs 1/2  Grew strep vitidans BAL 11/1 >>rare yeast  ANTIBIOTICS: Ceftriaxone 10/26 >> Metronidazole 10/26 >>discontinued Vancomycin 10/26 >> 10/27  SIGNIFICANT EVENTS: 10/28 Progressive decline in MS requiring intubation 10/29 Fever and shock overnight, requiring addition of phenylephrine gtt. Brain abscess aspiration 10/30 Off vasopressors 10/31 No weaning, no change in neuro  exam 11/02 Increased O2 needs overnight, resolved with increased PEEP  LINES/TUBES: ETT 10/28 >> PICC 11/1 (IR) >>   ASSESSMENT / PLAN:  PULMONARY 1. Acute Hypoxic Respiratory failure; Aspiration pneumonia: - s/p perc trach 11/5 - continue mechanical ventilation. Tolerated 11hrs of PSV on 03/21/17. So far this AM has been on PSV with PS 10 and PEEP 8 since 9am. ABG at 11am showed no hypercapnea. Patient appears to be tolerating it well. Will plan to keep on PSV 24/7 starting today. Recheck ABG in AM.  - CXR on my review shows bibasilar infiltrates (R>L) that have been present since admission. Patient has copious thick creamy secretions from ETT on suction.  - Will obtain new sputum culture (previous grew only candida) and check procalcitonin. Start Levoflox.  - continue tube feeds - continue GI and DVT prophylaxis - start chest PT q4hrs while awake  NEUROLOGIC 1. Strep Viridans Brain abscesses; Acute encephalopathy; Cerebral edema: - continue ceftriaxone per ID consult - clinically is awake but not  following my commands - will likely need rehab; consult PT/OT - wean decadron per Neurosurgery  CARDIOVASCULAR 1. Shock: present on admit; now resolved 2. SVT: on 11/7, now resolved.   RENAL 1. AKI: - improving Creatinine 1.23 on admit (from unknown baseline), up to 3.05 max during this admission, now improved to 1.90.  - monitor UOP; avoid nephrotoxic agents   GASTROINTESTINAL 1. Malnutrition: - continue tube feeds  HEMATOLOGIC No active issues   INFECTIOUS 1. Pneumonia and Strep Viridans Brain abscesses: - discussed above  ENDOCRINE 1. DM; Hyperglycemia: - currently on insulin gtt with BG 140-180's.  - will start back Lantus 22u daily (is on 70u daily at home) and aggressive SSI - stop insulin gtt  2. Hypernatremia (improving) - Na now 149 from 152  - continue to monitor  45 minutes critical care time  Vernie Murders, MD  Pulmonary and [place] Pager: [phone]  03/22/2017, 12:00 PM

## 2017-03-22 NOTE — Progress Notes (Signed)
    [facility]   Reason for visit: Follow up on brain abscess  Interval History: no acute changes.    Day 18 total antibiotics Day 18 ceftriaxone   Physical Exam: Constitutional:  Vitals:   03/22/17 0909 03/22/17 1000  BP:  (!) 151/73  Pulse:  82  Resp:  (!) 23  Temp:    SpO2: 99% 97%   patient does not respond Eyes: anicteric,open HENT: trach Respiratory: on vent Cardiovascular: RRR GI: soft, nt, nd Neuro: moves left arm, not right  Review of Systems: Unable to be assessed due to mental status  Lab Results  Component Value Date   WBC 24.4 (H) 03/22/2017   HGB 15.3 03/22/2017   HCT 48.4 03/22/2017   MCV 91.7 03/22/2017   PLT 275 03/22/2017    Lab Results  Component Value Date   CREATININE 1.90 (H) 03/22/2017   BUN 56 (H) 03/22/2017   NA 149 (H) 03/22/2017   K 4.4 03/22/2017   CL 116 (H) 03/22/2017   CO2 26 03/22/2017    Lab Results  Component Value Date   ALT 142 (H) 03/22/2017   AST 79 (H) 03/22/2017   ALKPHOS 181 (H) 03/22/2017     Microbiology: No results found for this or any previous visit (from the past 240 hour(s)).  Impression/Plan:  1. Brain abscess - on ceftriaxone and no changes.  No new growth and final. Continue with long-term ceftriaxone 6-8 weeks with follow up CT prior to consideration of stopping.    2.  Medication monitoring - no leukopenia on ceftriaxone.  Continue high dose bid ceftriaxone.     3.  Transaminitis - increased today with ALT up to 142.  Not typically a cephalosporin issue.  He is on amiodarone which can increase the statin.

## 2017-03-22 NOTE — Progress Notes (Signed)
Pharmacy Antibiotic Note  Timothy Jordan is a 61 y.o. male admitted on 03/05/2017 with brain abscess on Ceftriaxone, now with pneumonia. Discussed with ID (Comer) and CCM (Hammonds) and plans are to transition the patient to Cefepime to continue coverage for the viridans strep brain abscess as well as broadened coverage for pneumonia. SCr 1.9 << 2.23, CrCl~40-50 ml/min (normalized)  Plan: 1. D/c LVQ and Rocephin 2. Start Cefepime 2g IV every 12 hours (renally adjusted) 3. After completion of antibiotics for PNA - will follow-up on transition back to Rocephin per ID recs   Height: _0  (177.8 cm)(per family) Weight: 248 lb 7.3 oz (112.7 kg) IBW/kg (Calculated) : 73  Temp (24hrs), Avg:98.6 F (37 C), Min:98.1 F (36.7 C), Max:99.2 F (37.3 C)  Recent Labs  Lab 03/16/17 0334 03/18/17 0415 03/19/17 0414 03/20/17 0233 03/22/17 0500  WBC 20.9* 21.4*  --  23.4* 24.4*  CREATININE 2.57* 2.44* 2.34* 2.23* 1.90*    Estimated Creatinine Clearance: 52.6 mL/min (A) (by C-G formula based on SCr of 1.9 mg/dL (H)).    No Known Allergies  Vancomycin 10/26>>10/28 Ceftriaxone 10/26>> 11/12 Metronidazole 10/26>> 11/6 Cefepime 11/12 >>  10/26 Bcx >> ngF 10/28 Bcx >>ngF 10/28 TA >> normal flora  10/29 MRSA PCR >>negative 10/29 abscess: viridans strep (S-CTX) 11/1 trach aspirate: rare candida alb 11/1 BAL: norm resp flora 11/12 RCx (TA) >>   Thank you for allowing pharmacy to be a part of this patient's care.  Alycia Rossetti, PharmD, BCPS Clinical Pharmacist Pager: [phone] Clinical phone for 03/22/2017 from 7a-3:30p: [phone] If after 3:30p, please call main pharmacy at: [phone] 03/22/2017 2:34 PM

## 2017-03-22 NOTE — Progress Notes (Signed)
1345 Lantus 70 units given, CBG 150, insulin drip rate 7.1.  Per Dr. Priscille LovelessHammond's instructions will turn off the insulin drip at 1545 and use sliding scale.

## 2017-03-22 NOTE — Progress Notes (Signed)
Initial Nutrition Assessment  DOCUMENTATION CODES:   Obesity unspecified  INTERVENTION:   Vital High Protein @ 55 ml/hr (1320 ml/day) 30 ml Prostat daily Provides: 1420 kcal, 130 grams protein, and 1103 ml free water.  Total free water: 3503 ml    NUTRITION DIAGNOSIS:   Inadequate oral intake related to inability to eat as evidenced by NPO status. Ongoing.   GOAL:   Provide needs based on ASPEN/SCCM guidelines Met.   MONITOR:   Vent status, TF tolerance, Weight trends, Labs  ASSESSMENT:   61 y.o. male with medical history significant of diabetes mellitus type 2 and hypertension who presents with altered mentation. For last 48 hours patient has been developing progressive confusion and disorientation, to the point where he was noted to have dis-balance, falls and hallucinations. His confusion is moderate to severe in intensity, it was associated with slurred speech and right upper extremity weakness  Pt discussed during ICU rounds and with RN.   11/5 trach, Cortrak (stomach)  Medications reviewed and include: decadron, senokot, lantus, novolog Free water 400 ml every 4 hours CBG's: 143-150-162 Labs reviewed: Na 149 (H) Weight up 10 lb, pt is positive 9L since admission    Diet Order:  No diet orders on file  EDUCATION NEEDS:   Not appropriate for education at this time  Skin:  Skin Assessment: Skin Integrity Issues: Skin Integrity Issues:: Incisions, Other (Comment) Incisions: head Other: non-pressure wound to R leg, skin peeling  Last BM:  11/12  Height:   Ht Readings from Last 1 Encounters:  03/10/17 _0  (1.778 m)    Weight:   Wt Readings from Last 1 Encounters:  03/22/17 248 lb 7.3 oz (112.7 kg)    Ideal Body Weight:  64.54 kg  BMI:  Body mass index is 35.65 kg/m.  Estimated Nutritional Needs:   Kcal:  1211-1541 (11-14 kcal/kg)  Protein:  129 grams (2 grams/kg)  Fluid:  >/= 1.5 L/day    Maylon Peppers RD, LDN, CNSC [phone]  Pager [phone] After Hours Pager

## 2017-03-22 NOTE — Progress Notes (Signed)
Patient ID: Timothy Jordan, male   DOB: 1955-10-14, 61 y.o.   MRN: [id number] Subjective: The patient is alert and attentive.  He is in no apparent distress.  Objective: Vital signs in last 24 hours: Temp:  [98.1 F (36.7 C)-99.2 F (37.3 C)] 98.6 F (37 C) (11/12 0400) Pulse Rate:  [72-84] 72 (11/12 0700) Resp:  [21-31] 21 (11/12 0700) BP: (138-164)/(57-80) 144/70 (11/12 0700) SpO2:  [95 %-100 %] 97 % (11/12 0700) FiO2 (%):  [40 %] 40 % (11/12 0352) Weight:  [112.7 kg (248 lb 7.3 oz)] 112.7 kg (248 lb 7.3 oz) (11/12 0413)  Intake/Output from previous day: 11/11 0701 - 11/12 0700 In: 4185.3 [I.V.:405.3; NG/GT:2165; IV Piggyback:1615] Out: 2880 [Urine:2880] Intake/Output this shift: No intake/output data recorded.  Physical exam the patient is very alert and attentive.  He does not follow commands.  He appears a phasic.  He localizes on the left.  He has right hemiplegic.  Lab Results: Recent Labs    03/20/17 0233 03/22/17 0500  WBC 23.4* 24.4*  HGB 15.4 15.3  HCT 48.4 48.4  PLT 270 275   BMET Recent Labs    03/20/17 0233 03/22/17 0500  NA 149* 149*  K 4.4 4.4  CL 118* 116*  CO2 25 26  GLUCOSE 140* 181*  BUN 58* 56*  CREATININE 2.23* 1.90*  CALCIUM 8.6* 8.6*    Studies/Results: No results found.  Assessment/Plan: Basal ganglia abscess: The patient is on empiric antibiotics.  He seems to be slowly improving.  I will wean his Decadron.  LOS: 17 days     Karey Stucki D 03/22/2017, 7:57 AM

## 2017-03-22 NOTE — Progress Notes (Signed)
[place] TEAM 1 - Stepdown/ICU TEAM  Timothy Jordan  NGE:[id number] DOB: 1956/04/09 DOA: 03/05/2017 PCP: Patient, No Pcp Per    Brief Narrative:  61 year old male w/ hx of DM and HTN and dental extraction 30 days prior who was admitted with a brain abscess, and then developed encephalopathy requiring intubation.  He underwent percutaneous tracheostomy 11/5. Early 11/6 he had an episode of NSVT. He was started on amiodarone and has maintained NSR since.  Significant Events: 10/26  admit w/ AMS - ED w/u revealed a brain abscess 10/28 Progressive decline in MS > intubation 10/29 Fever and shock overnight, requiring addition of phenylephrine gtt - Brain abscess aspiration 10/30 Off vasopressors 10/31 No weaning, no change in neuro exam 11/02 Increased O2 needs overnight, resolved with increased PEEP 11/06  NSVT - amio started  11/11  TRH assumed care   Subjective: Pt is non-communicative.  He does not appear to be uncomfortable and there is no evidence of respiratory distress.  No signif change in mental status is appreciated.    Assessment & Plan:  L basal ganglia Brain abscess - Strep Viridans  S/p drainage per NS - abx as directed by ID - decadron being weaned   Acute encephalopathy EEG 11/7 negative for seizure - unclear to what extent pt will recover, but recovery will clearly be a protracted issue if it occurs - wean steroids - avoid sedatives as able   Transaminitis  ?amio - stop amio and follow   Brief NSVT Pt experienced an apparent self-limited brief episode of NSVT on 11/6, for which amio was started - stop amio and follow - TTE was w/o signif structural heart disease   Acute respiratory failure with hypoxemia Trach/vent care per PCCM  Hypernatremia  Cont free water admin via cortrak and follow   Obesity - Body mass index is 35.65 kg/m.  Possible adrenal insufficiency  Random AM cortisol very low at 1.5 on 11/09 but this is in presence of high dose ongoing  decadron tx - will need to be re-evaluated when pt off steroid tx   ?Hyperthyroidism TSH low at 0.233, but FT4 normal - will need recheck when not acutely ill   DM2 CBG well controlled on insulin gtt - attempt to wean off gtt today as steroids are being weaned as well   AKI Renal fxn appears to be slowly improving - follow trend  Recent Labs  Lab 03/16/17 0334 03/18/17 0415 03/19/17 0414 03/20/17 0233 03/22/17 0500  CREATININE 2.57* 2.44* 2.34* 2.23* 1.90*    HTN BP modestly elevated - gently adjust tx and follow - avoid overcorrection   DVT prophylaxis: lovenox  Code Status: FULL CODE Family Communication: spoke w/ wife at bedside  Disposition Plan: awaiting possible LTACH transfer   Consultants:  PCCM ID Neurosurgery   Antimicrobials:  Ceftriaxone 10/26 > Metronidazole 10/26  Vancomycin 10/26 > 10/27  Objective: Blood pressure (!) 151/73, pulse 82, temperature 98.6 F (37 C), temperature source Axillary, resp. rate (!) 23, height '5\' 10"'$  (1.778 m), weight 112.7 kg (248 lb 7.3 oz), SpO2 97 %.  Intake/Output Summary (Last 24 hours) at 03/22/2017 1048 Last data filed at 03/22/2017 1000 Gross per 24 hour  Intake 4028.76 ml  Output 2305 ml  Net 1723.76 ml   Filed Weights   03/20/17 0246 03/21/17 0437 03/22/17 0413  Weight: 114.8 kg (253 lb 1.4 oz) 115.1 kg (253 lb 12 oz) 112.7 kg (248 lb 7.3 oz)    Examination: General: No acute respiratory distress  evident  Lungs: Clear to auscultation B - no wheezing  Cardiovascular: Regular rate and rhythm w/o M or rub  Abdomen: Nondistended, soft, bowel sounds positive, no ascites, no appreciable mass, protuberant  Extremities: 1+ B LE edema   CBC: Recent Labs  Lab 03/16/17 0334 03/18/17 0415 03/20/17 0233 03/22/17 0500  WBC 20.9* 21.4* 23.4* 24.4*  NEUTROABS 18.2* 19.5* 21.5*  --   HGB 15.1 15.3 15.4 15.3  HCT 48.1 47.8 48.4 48.4  MCV 93.6 93.2 92.2 91.7  PLT 255 259 270 578   Basic Metabolic Panel: Recent  Labs  Lab 03/16/17 0334 03/18/17 0415 03/19/17 0414 03/20/17 0233 03/22/17 0500  NA 153* 152* 149* 149* 149*  K 4.4 4.0 4.6 4.4 4.4  CL 119* 119* 117* 118* 116*  CO2 '27 26 25 25 26  '$ GLUCOSE 322* 135* 164* 140* 181*  BUN 66* 56* 58* 58* 56*  CREATININE 2.57* 2.44* 2.34* 2.23* 1.90*  CALCIUM 8.8* 8.7* 8.6* 8.6* 8.6*  MG 2.8* 2.3  --   --   --   PHOS 3.2  --   --   --   --    GFR: Estimated Creatinine Clearance: 52.6 mL/min (A) (by C-G formula based on SCr of 1.9 mg/dL (H)).  Liver Function Tests: Recent Labs  Lab 03/16/17 0334 03/19/17 0414 03/22/17 0500  AST 30 49* 79*  ALT 21 49 142*  ALKPHOS 106 135* 181*  BILITOT 0.3 0.3 0.3  PROT 6.6 6.1* 6.0*  ALBUMIN 1.8* 1.7* 1.8*    Cardiac Enzymes: Recent Labs  Lab 03/16/17 0544 03/16/17 1150 03/16/17 1739  TROPONINI 1.83* 1.79* 1.65*    HbA1C: Hgb A1c MFr Bld  Date/Time Value Ref Range Status  03/12/2017 09:33 AM 9.1 (H) 4.8 - 5.6 % Final    Comment:    (NOTE) Pre diabetes:          5.7%-6.4% Diabetes:              >6.4% Glycemic control for   <7.0% adults with diabetes   03/06/2017 12:03 PM 9.9 (H) 4.8 - 5.6 % Final    Comment:    (NOTE) Pre diabetes:          5.7%-6.4% Diabetes:              >6.4% Glycemic control for   <7.0% adults with diabetes     CBG: Recent Labs  Lab 03/22/17 0437 03/22/17 0528 03/22/17 0625 03/22/17 0808 03/22/17 1000  GLUCAP 157* 168* 186* 162* 141*     Scheduled Meds: . albuterol  2.5 mg Nebulization TID  . amiodarone  200 mg Per Tube BID  . aspirin  81 mg Per Tube Daily  . atorvastatin  40 mg Per Tube q1800  . chlorhexidine gluconate (MEDLINE KIT)  15 mL Mouth Rinse BID  . Chlorhexidine Gluconate Cloth  6 each Topical Daily  . dexamethasone  2 mg Intravenous Q12H  . enoxaparin (LOVENOX) injection  40 mg Subcutaneous Q24H  . feeding supplement (PRO-STAT SUGAR FREE 64)  30 mL Per Tube Daily  . free water  300 mL Per Tube Q4H  . insulin aspart  0-20 Units  Subcutaneous Q4H  . insulin glargine  22 Units Subcutaneous Daily  . mouth rinse  15 mL Mouth Rinse 10 times per day  . metoprolol tartrate  2.5 mg Intravenous Q6H  . mupirocin cream   Topical BID  . pantoprazole sodium  40 mg Per Tube Daily  . sennosides  5 mL Per  Tube QHS  . sodium chloride flush  10-40 mL Intracatheter Q12H     LOS: 17 days   Cherene Altes, MD [facility] Office  [phone] Pager - Text Page per Amion as per below:  On-Call/Text Page:      [email or web address]      password [email or web address]  If 7PM-7AM, please contact night-coverage [email or web address] Password [facility] 03/22/2017, 10:48 AM

## 2017-03-22 NOTE — Progress Notes (Signed)
Pharmacy Antibiotic Note  Timothy Jordan is a 61 y.o. male admitted on 03/05/2017 with brain abscess on Ceftriaxone, now with pneumonia.  Pharmacy has been consulted for Levaquin dosing. Afebrile, WBC up 24.4 (IV steroids), SCr down 1.9, CrCl~53.  Plan: Levaquin 750mg  IV q24h Continue ceftriaxone 2g IV q12h Monitor clinical progress, c/s, renal function F/u de-escalation plan/LOT   Height: 5\' 10"  (177.8 cm)(per family) Weight: 248 lb 7.3 oz (112.7 kg) IBW/kg (Calculated) : 73  Temp (24hrs), Avg:98.6 F (37 C), Min:98.1 F (36.7 C), Max:99.2 F (37.3 C)  Recent Labs  Lab 03/16/17 0334 03/18/17 0415 03/19/17 0414 03/20/17 0233 03/22/17 0500  WBC 20.9* 21.4*  --  23.4* 24.4*  CREATININE 2.57* 2.44* 2.34* 2.23* 1.90*    Estimated Creatinine Clearance: 52.6 mL/min (A) (by C-G formula based on SCr of 1.9 mg/dL (H)).    No Known Allergies  Babs BertinHaley Delphine Sizemore, PharmD, BCPS Clinical Pharmacist 03/22/2017 12:40 PM

## 2017-03-23 DIAGNOSIS — J690 Pneumonitis due to inhalation of food and vomit: Secondary | ICD-10-CM

## 2017-03-23 LAB — COMPREHENSIVE METABOLIC PANEL
ALBUMIN: 1.8 g/dL — AB (ref 3.5–5.0)
ALT: 132 U/L — ABNORMAL HIGH (ref 17–63)
ANION GAP: 6 (ref 5–15)
AST: 55 U/L — ABNORMAL HIGH (ref 15–41)
Alkaline Phosphatase: 180 U/L — ABNORMAL HIGH (ref 38–126)
BUN: 54 mg/dL — ABNORMAL HIGH (ref 6–20)
CALCIUM: 8.4 mg/dL — AB (ref 8.9–10.3)
CHLORIDE: 113 mmol/L — AB (ref 101–111)
CO2: 25 mmol/L (ref 22–32)
Creatinine, Ser: 1.89 mg/dL — ABNORMAL HIGH (ref 0.61–1.24)
GFR calc non Af Amer: 37 mL/min — ABNORMAL LOW (ref >60)
GFR, EST AFRICAN AMERICAN: 43 mL/min — AB (ref >60)
GLUCOSE: 281 mg/dL — AB (ref 65–99)
POTASSIUM: 4.8 mmol/L (ref 3.5–5.1)
SODIUM: 144 mmol/L (ref 135–145)
Total Bilirubin: 0.4 mg/dL (ref 0.3–1.2)
Total Protein: 5.7 g/dL — ABNORMAL LOW (ref 6.5–8.1)

## 2017-03-23 LAB — BLOOD GAS, ARTERIAL
ACID-BASE EXCESS: 0.9 mmol/L (ref 0.0–2.0)
Bicarbonate: 24.5 mmol/L (ref 20.0–28.0)
DRAWN BY: 12971
FIO2: 40
O2 SAT: 98 %
PATIENT TEMPERATURE: 98.6
PCO2 ART: 36 mmHg (ref 32.0–48.0)
PEEP: 8 cmH2O
PH ART: 7.448 (ref 7.350–7.450)
PIP: 10 cmH2O
pO2, Arterial: 123 mmHg — ABNORMAL HIGH (ref 83.0–108.0)

## 2017-03-23 LAB — GLUCOSE, CAPILLARY
GLUCOSE-CAPILLARY: 144 mg/dL — AB (ref 65–99)
GLUCOSE-CAPILLARY: 145 mg/dL — AB (ref 65–99)
GLUCOSE-CAPILLARY: 116 mg/dL — AB (ref 65–99)
GLUCOSE-CAPILLARY: 163 mg/dL — AB (ref 65–99)
Glucose-Capillary: 264 mg/dL — ABNORMAL HIGH (ref 65–99)
Glucose-Capillary: 216 mg/dL — ABNORMAL HIGH (ref 65–99)
Glucose-Capillary: 150 mg/dL — ABNORMAL HIGH (ref 65–99)

## 2017-03-23 LAB — CBC WITH DIFFERENTIAL/PLATELET
BASOS ABS: 0 10*3/uL (ref 0.0–0.1)
BASOS PCT: 0 %
Eosinophils Absolute: 0 10*3/uL (ref 0.0–0.7)
Eosinophils Relative: 0 %
HEMATOCRIT: 46.8 % (ref 39.0–52.0)
HEMOGLOBIN: 15.1 g/dL (ref 13.0–17.0)
Lymphocytes Relative: 7 %
Lymphs Abs: 1.2 10*3/uL (ref 0.7–4.0)
MCH: 29.7 pg (ref 26.0–34.0)
MCHC: 32.3 g/dL (ref 30.0–36.0)
MCV: 91.9 fL (ref 78.0–100.0)
Monocytes Absolute: 1.4 10*3/uL — ABNORMAL HIGH (ref 0.1–1.0)
Monocytes Relative: 8 %
NEUTROS ABS: 15.3 10*3/uL — AB (ref 1.7–7.7)
NEUTROS PCT: 85 %
Platelets: 253 10*3/uL (ref 150–400)
RBC: 5.09 MIL/uL (ref 4.22–5.81)
RDW: 15.3 % (ref 11.5–15.5)
WBC: 17.9 10*3/uL — ABNORMAL HIGH (ref 4.0–10.5)

## 2017-03-23 LAB — SODIUM: Sodium: 145 mmol/L (ref 135–145)

## 2017-03-23 LAB — VITAMIN B12: Vitamin B-12: 2087 pg/mL — ABNORMAL HIGH (ref 180–914)

## 2017-03-23 LAB — PROCALCITONIN: Procalcitonin: 0.18 ng/mL

## 2017-03-23 LAB — PHOSPHORUS: PHOSPHORUS: 4 mg/dL (ref 2.5–4.6)

## 2017-03-23 LAB — RPR: RPR Ser Ql: NONREACTIVE

## 2017-03-23 LAB — FOLATE: Folate: 14 ng/mL (ref >5.9)

## 2017-03-23 LAB — MAGNESIUM: Magnesium: 2.1 mg/dL (ref 1.7–2.4)

## 2017-03-23 MED ORDER — PRO-STAT SUGAR FREE PO LIQD
60.0000 mL | Freq: Three times a day (TID) | ORAL | Status: DC
  Administered 2017-03-23 – 2017-03-24 (×5): 60 mL
  Filled 2017-03-23 (×5): qty 60

## 2017-03-23 MED ORDER — INSULIN ASPART 100 UNIT/ML ~~LOC~~ SOLN
20.0000 [IU] | Freq: Once | SUBCUTANEOUS | Status: AC
  Administered 2017-03-23: 20 [IU] via SUBCUTANEOUS

## 2017-03-23 MED ORDER — INSULIN GLARGINE 100 UNIT/ML ~~LOC~~ SOLN
80.0000 [IU] | Freq: Every day | SUBCUTANEOUS | Status: DC
  Administered 2017-03-24: 80 [IU] via SUBCUTANEOUS
  Filled 2017-03-23 (×2): qty 0.8

## 2017-03-23 MED ORDER — GLUCERNA 1.2 CAL PO LIQD
1000.0000 mL | ORAL | Status: DC
  Administered 2017-03-23: 1000 mL
  Filled 2017-03-23 (×2): qty 1000

## 2017-03-23 NOTE — Progress Notes (Signed)
eLink Physician-Brief Progress Note Patient Name: Timothy NuttingKelvin M Kral DOB: December 28, 1955 MRN: [id number]   Date of Service  03/23/2017  HPI/Events of Note  Blood glucose = 264.   eICU Interventions  Will order: 1. Novolog 20 units [place] now.      Intervention Category Major Interventions: Hyperglycemia - active titration of insulin therapy  Sommer,Steven Eugene 03/23/2017, 3:45 AM

## 2017-03-23 NOTE — Progress Notes (Signed)
PULMONARY / CRITICAL CARE MEDICINE   Name: Timothy Jordan MRN: [id number] DOB: Feb 09, 1956    ADMISSION DATE:  03/05/2017 CONSULTATION DATE: 03/07/17  CHIEF COMPLAINT: Brain abscess, Ventilator management  HISTORY OF PRESENT ILLNESS:   61yoM with h/ol DM, admitted 10/26 with AMS and found to have brain abscesses (strep viridans) who developed acute encephalopathy and required intubation. He is s/p percutaneous tracheostomy 11/5. Has episode of SVT on 11/7 started on Amiodarone. He has continued to require mechanical ventilation. Tolerated PSV trial for 11hrs (8am-7pm) on 03/21/17. Started back on PSV on 11/12 AM and as of now has been on it continuously for 24hrs and is doing well. ABG this AM is still pending. Had copious thick creamy secretions from trach on 11/12; since broadening antibiotics yesterday his secretions has since resolved and leukocytosis improved. Transitioned off insulin gtt on 11/12 and restarted on Lantus and Aggressive SSI. Remains hyperglycemic in 200's today.   PAST MEDICAL HISTORY :  He  has a past medical history of Diabetes mellitus without complication ([place]).  PAST SURGICAL HISTORY: He  has a past surgical history that includes Hernia repair; Achilles tendon repair; IR Fluoro Guide CV Line Right (03/11/2017); IR US Guide Vasc Access Right (03/11/2017); and FRAMLESS STERIOTACTIC BIOPSY WITH STEALTH (Left, 03/08/2017).  No Known Allergies  No current facility-administered medications on file prior to encounter.    Current Outpatient Medications on File Prior to Encounter  Medication Sig  . aspirin EC 81 MG tablet Take 81 mg by mouth daily.  . insulin glargine (LANTUS) 100 UNIT/ML injection Inject 70 Units into the skin every morning.  Marland Kitchen LIPITOR 40 MG tablet Take 1 tablet by mouth at bedtime.  Marland Kitchen lisinopril (PRINIVIL,ZESTRIL) 20 MG tablet Take 1 tablet by mouth daily.  . metFORMIN (GLUCOPHAGE) 1000 MG tablet Take 1 tablet by mouth 2 (two) times daily.   FAMILY  HISTORY:  His has no family status information on file.   SOCIAL HISTORY: He  reports that he has quit smoking. he has never used smokeless tobacco. He reports that he does not drink alcohol.  REVIEW OF SYSTEMS:   Review of Systems  Unable to perform ROS: Critical illness   VITAL SIGNS: BP (!) 158/76   Pulse 73   Temp 98.4 F (36.9 C) (Axillary)   Resp (!) 25   Ht _0  (1.778 m) Comment: per family  Wt 109.9 kg (242 lb 4.6 oz)   SpO2 99%   BMI 34.76 kg/m   VENTILATOR SETTINGS: Vent Mode: PSV;CPAP FiO2 (%):  [40 %] 40 % PEEP:  [8 cmH20] 8 cmH20 Pressure Support:  [10 cmH20] 10 cmH20  INTAKE / OUTPUT: I/O last 3 completed shifts: In: 3013 [I.V.:533.5; HY/HO:8875; IV Piggyback:472.5] Out: 7972 [Urine:5575]  PHYSICAL EXAMINATION: General: trach on vent, lying in bed, critically ill Neuro: somnolent, opens eyes to sternal rub, not obeying commands  HEENT: 6 Shiley trach with now only scant creamy secretions on suctioning.   Cardiovascular: RRR no m/r/g Lungs: CTA b/l Abdomen: Soft NTND Musculoskeletal: Trace BLE edema Skin: no rashes   LABS:  BMET Recent Labs  Lab 03/20/17 0233 03/22/17 0500 03/23/17 0353  NA 149* 149* 144  K 4.4 4.4 4.8  CL 118* 116* 113*  CO2 _1 BUN 58* 56* 54*  CREATININE 2.23* 1.90* 1.89*  GLUCOSE 140* 181* 281*    Electrolytes Recent Labs  Lab 03/18/17 0415  03/20/17 0233 03/22/17 0500 03/23/17 0353  CALCIUM 8.7*   < > 8.6* 8.6*  8.4*  MG 2.3  --   --   --  2.1  PHOS  --   --   --   --  4.0   < > = values in this interval not displayed.    CBC Recent Labs  Lab 03/20/17 0233 03/22/17 0500 03/23/17 0925  WBC 23.4* 24.4* 17.9*  HGB 15.4 15.3 15.1  HCT 48.4 48.4 46.8  PLT 270 275 253   Coag's No results for input(s): APTT, INR in the last 168 hours.  Sepsis Markers Recent Labs  Lab 03/22/17 1230 03/23/17 0353  PROCALCITON 0.20 0.18   ABG Recent Labs  Lab 03/20/17 0330 03/22/17 0908 03/22/17 1058   PHART 7.440 7.445 7.456*  PCO2ART 37.2 38.7 38.5  PO2ART 76.5* 116.0* 83.0   Liver Enzymes Recent Labs  Lab 03/19/17 0414 03/22/17 0500 03/23/17 0353  AST 49* 79* 55*  ALT 49 142* 132*  ALKPHOS 135* 181* 180*  BILITOT 0.3 0.3 0.4  ALBUMIN 1.7* 1.8* 1.8*   Cardiac Enzymes Recent Labs  Lab 03/16/17 1150 03/16/17 1739  TROPONINI 1.79* 1.65*   Glucose Recent Labs  Lab 03/22/17 1551 03/22/17 1945 03/22/17 2318 03/23/17 0315 03/23/17 0453 03/23/17 0757  GLUCAP 157* 211* 266* 264* 216* 144*   Imaging No results found. STUDIES: ECHO 10/27 >> study stopped prematurely due to pt movement, mild LVH, LVEF 55-60% CT Head 10/28 >> Stable to slight enlargement of left basal ganglia masses compatible with abscesses on MRI. Worsening surrounding edema with 5 mm of rightward midline shift Renal US 10/29 >> complex left-sided renal cysts, no obstructive uropathy, trace perinephric fluid is seen about both kidneys  CULTURES: BCx2 10/26 >> negative Sputum 10/28 >>normal flora  UA 10/26 >> negative HIV 10/26 >>negative  BCx2 10/28 >> GPC pairs 1/2  Grew strep vitidans BAL 11/1 >>rare yeast Sputum 11/12 >>moderate GNR's, rare GPR's, rare GPC's in pairs  ANTIBIOTICS: Ceftriaxone 10/26 >>11/12 Metronidazole 10/26 >>discontinued Vancomycin 10/26 >> 10/27 Cefepime 11/12 >>  SIGNIFICANT EVENTS: 10/28 Progressive decline in MS requiring intubation 10/29 Fever and shock overnight, requiring addition of phenylephrine gtt. Brain abscess aspiration 10/30 Off vasopressors 10/31 No weaning, no change in neuro exam 11/02 Increased O2 needs overnight, resolved with increased PEEP 11/11  Tolerated 11hrs of PSV (PS 10, PEEP 8); placed back on full support vent settings as it was the plan, not because patient tired out 11/12  Started PSV (PS 10, PEEP 8) at 9am with plan to remain on continuous PSV as tolerates; Copious purulent secretions from [facility] 11/13  Tolerating PSV well;  Much less secretions from trach  LINES/TUBES: ETT 10/28 >>11/5 PICC 11/1 (IR) >> Perc Trach 11/5  ASSESSMENT / PLAN:  PULMONARY 1. Acute Hypoxic Respiratory failure; Aspiration pneumonia: - s/p perc trach 11/5 - continue mechanical ventilation. Tolerated 11hrs of PSV on 03/21/17. Placed on PSV 11/13 AM and been on it continuously since then and tolerating well with good RSBI. - ABG today to ensure not developing hypercapnea - if ABG looks good, will attempt to wean PSV settings to PS 8 and PEEP 5; if can tolerate those settings well, will attempt some TC trials. - CXR 11/12 on my review shows bibasilar infiltrates (R>L) that have been present since admission. No new CXR today. Patient had copious thick creamy secretions from ETT on suction, concerning for aspiration pneumonia. Sputum culture (11/12) shows GNR's on gram stain; culture pending. Ceftriaxone changed to Cefepime to cover both brain abscesses as well as the aspiration pneumonia.  -  continue tube feeds - continue GI and DVT prophylaxis - continue chest PT q4hrs while awake  NEUROLOGIC 1. Strep Viridans Brain abscesses; Acute encephalopathy; Cerebral edema: - continue Cefepime per ID consult; will transition back to Ceftriaxone following conclusion of treatment for the pneumonia - clinically is somnolent and not following my commands - will likely need rehab; consult PT/OT - wean decadron per Neurosurgery - continue keppra  CARDIOVASCULAR 1. Shock: present on admit; now resolved 2. SVT: on 11/7, now resolved.   RENAL 1. AKI: - improving Creatinine 1.23 on admit (from unknown baseline), up to 3.05 max during this admission, now improved to 1.89.  - monitor UOP; avoid nephrotoxic agents   2. HYPERnatremia (improving): - continue free water 400cc q4hrs - recheck Na this afternoon; if continues to decrease will decrease free water flushes   GASTROINTESTINAL 1. Malnutrition: - continue tube feeds; Nutrition to  transition from Vital High protein to Glucerna to aid in correction of his hyperglycemia  HEMATOLOGIC No active issues   INFECTIOUS 1. Pneumonia and Strep Viridans Brain abscesses: - discussed above  ENDOCRINE 1. DM; Hyperglycemia: - off insulin gtt 11/12 - Increase Lantus from 70u to 80u; continue  aggressive SSI   45 minutes critical care time  Vernie Murders, MD  Pulmonary and [place] Pager: [phone]  03/23/2017, 10:40 AM

## 2017-03-23 NOTE — Progress Notes (Addendum)
Inpatient Rehabilitation  Per PT and OT request, patient was screened by Fae PippinMelissa Hanako Tipping for appropriateness for an Inpatient Acute Rehab consult.  At this time we note [facility]  This appears appropriate at this time.  Please consider IP Rehab is LTAC denied or post [facility] stay.  Call if questions.   Charlane FerrettiMelissa Asya Derryberry, M.A., CCC/SLP Admission Coordinator  [facility], [facility], [facility]  Cell [phone]

## 2017-03-23 NOTE — Progress Notes (Signed)
RT note-Tolerated ATC well at 50%. Moderate work of breathing post 4 hours of weaning. Placed back to PS/CPAP, continue to monitor.

## 2017-03-23 NOTE — Progress Notes (Signed)
Rt note-PT remains on PS and CPAP throughout the night.

## 2017-03-23 NOTE — Evaluation (Addendum)
Physical Therapy Evaluation Patient Details Name: Timothy Jordan MRN: [id number] DOB: [birth year] Today's Date: 03/23/2017   History of Present Illness  61 y.o. male with medical history significant of diabetes mellitus type 2 and hypertension who presents with altered mentation and left basal ganglia abscess. Intubated 10/28, abscess aspirated 10/29, Trach 11/5  Clinical Impression  Pt in bed on arrival with LUE restrained, leaning right with left leg internally rotated and knee flexed with left foot in left hand pulling on Prevalon. Pt with limited ability to track and attend throughout session. He will attend to loud noise to left or cues for his attention at times. Pt with no noted motion or withdrawal of right side. Pt able to follow one step commands grossly 50% of the time with delay for mobility and UE tasks, he only followed cues to assist with Left knee flexion for lower body movement. Pt lifted bed to chair with maxi sky with RN aware of mobility and need for assist. Pt on vent CPAP via trach with RT placing on trach collar end of session. Pt with decreased cognition, strength, function, balance and mobility who will benefit from acute therapy to maximize mobility, strength, balance and function to decrease burden of care and maximize quality of life.     Follow Up Recommendations CIR;Supervision/Assistance - 24 hour    Equipment Recommendations  Wheelchair (measurements PT);Hospital bed;Wheelchair cushion (measurements PT)    Recommendations for Other Services       Precautions / Restrictions Precautions Precautions: Fall Precaution Comments: trach, Cortrak Restrictions Weight Bearing Restrictions: No      Mobility  Bed Mobility Overal bed mobility: Needs Assistance Bed Mobility: Rolling Rolling: Mod assist;Max assist         General bed mobility comments: Pt able to roll toward R using L hand topull on rail. cues ot facilitate; Max A to roll toward L; facilitation  through head and shoulders and right knee/hips. Pt able to maintain right sidelying.   Transfers                 General transfer comment: [place] used to lift to chair. Max assist to lean forward in chair and reposition trunk to midline in chair with wedge as pt leaning right over armrest  Ambulation/Gait             General Gait Details: unable  Stairs            Wheelchair Mobility    Modified Rankin (Stroke Patients Only)       Balance                                             Pertinent Vitals/Pain CPOT= 0    Home Living Family/patient expects to be discharged to:: Private residence Living Arrangements: Spouse/significant other Available Help at Discharge: Family;Available 24 hours/day Type of Home: House Home Access: Stairs to enter Entrance Stairs-Rails: Doctor, general practiceight;Left Entrance Stairs-Number of Steps: 2-3 Home Layout: One level Home Equipment: None Additional Comments: Rental house    Prior Function Level of Independence: Independent         Comments: Recently retired. Enjoys going ot football games. Independent with ADLs and wife does IADLs     Hand Dominance   Dominant Hand: Right    Extremity/Trunk Assessment   Upper Extremity Assessment Upper Extremity Assessment: Defer to OT evaluation  Lower Extremity Assessment Lower Extremity Assessment: RLE deficits/detail;LLE deficits/detail RLE Deficits / Details: no AROM or withdrawal to noxious stimuli, WFL PROM LLE Deficits / Details: pt moving actively in bed and impulsively but only able to assist with knee flexion in supine on command. Grossly 2/5    Cervical / Trunk Assessment Cervical / Trunk Assessment: Other exceptions Cervical / Trunk Exceptions: rounded shoulders with forward head, preference for right lean  Communication   Communication: Tracheostomy  Cognition Arousal/Alertness: Awake/alert Behavior During Therapy: Flat affect Overall Cognitive  Status: Impaired/Different from baseline Area of Impairment: Attention;Following commands                   Current Attention Level: Sustained   Following Commands: Follows one step commands inconsistently;Follows one step commands with increased time       General Comments: pt with flat affect, not speaking, follows some commands inconsistently with delay      General Comments      Exercises    Assessment/Plan    PT Assessment Patient needs continued PT services  PT Problem List Decreased strength;Decreased mobility;Decreased safety awareness;Decreased range of motion;Decreased coordination;Decreased activity tolerance;Decreased cognition;Cardiopulmonary status limiting activity;Decreased balance;Decreased knowledge of use of DME       PT Treatment Interventions Therapeutic exercise;Patient/family education;DME instruction;Therapeutic activities;Cognitive remediation;Balance training;Functional mobility training;Neuromuscular re-education    PT Goals (Current goals can be found in the Care Plan section)  Acute Rehab PT Goals Patient Stated Goal: per wife - to get better and be able to go home PT Goal Formulation: With family Time For Goal Achievement: 04/06/17 Potential to Achieve Goals: Fair    Frequency Min 3X/week   Barriers to discharge Decreased caregiver support wife available to assist but unable to physically lift pt    Co-evaluation PT/OT/SLP Co-Evaluation/Treatment: Yes Reason for Co-Treatment: Complexity of the patient's impairments (multi-system involvement);For patient/therapist safety;To address functional/ADL transfers PT goals addressed during session: Mobility/safety with mobility OT goals addressed during session: ADL's and self-care       AM-PAC PT "6 Clicks" Daily Activity  Outcome Measure Difficulty turning over in bed (including adjusting bedclothes, sheets and blankets)?: Unable Difficulty moving from lying on back to sitting on the  side of the bed? : Unable Difficulty sitting down on and standing up from a chair with arms (e.g., wheelchair, bedside commode, etc,.)?: Unable Help needed moving to and from a bed to chair (including a wheelchair)?: Total Help needed walking in hospital room?: Total Help needed climbing 3-5 steps with a railing? : Total 6 Click Score: 6    End of Session   Activity Tolerance: Patient tolerated treatment well Patient left: in chair;with family/visitor present;with nursing/sitter in room Nurse Communication: Mobility status;Need for lift equipment PT Visit Diagnosis: Other abnormalities of gait and mobility (R26.89);Hemiplegia and hemiparesis Hemiplegia - Right/Left: Right Hemiplegia - dominant/non-dominant: Dominant Hemiplegia - caused by: Unspecified    Time: [id number] PT Time Calculation (min) (ACUTE ONLY): 32 min   Charges:   PT Evaluation $PT Eval High Complexity: 1 High     PT G Codes:        Delaney MeigsMaija Tabor Laniya Friedl, PT [phone]   Micheala Morissette B Yoshio Seliga 03/23/2017, 12:21 PM

## 2017-03-23 NOTE — Progress Notes (Signed)
[place] TEAM 1 - Stepdown/ICU TEAM  Timothy Jordan  CVE:[id number] DOB: Jul 31, 1955 DOA: 03/05/2017 PCP: Patient, No Pcp Per    Brief Narrative:  61 year old male w/ hx of DM and HTN and dental extraction 30 days prior who was admitted with a brain abscess, and then developed encephalopathy requiring intubation.  He underwent percutaneous tracheostomy 11/5. Early 11/6 he had an episode of NSVT. He was started on amiodarone and has maintained NSR since.  Significant Events: 10/26  admit w/ AMS - ED w/u revealed a brain abscess 10/28 Progressive decline in MS > intubation 10/29 Fever and shock overnight, requiring addition of phenylephrine gtt - Brain abscess aspiration 10/30 Off vasopressors 10/31 No weaning, no change in neuro exam 11/02 Increased O2 needs overnight, resolved with increased PEEP 11/06  NSVT - amio started  11/11  TRH assumed care   Subjective: All active issues addressed by PCCM today.  Chart and results fully reviewed.  Nothing new for me to add today.  No charge from [facility] for 11/13.    Assessment & Plan:  L basal ganglia Brain abscess - Strep Viridans  S/p drainage per NS - abx as directed by ID - decadron being weaned   Acute encephalopathy EEG 11/7 negative for seizure - unclear to what extent pt will recover, but recovery will clearly be a protracted issue if it occurs - wean steroids - avoid sedatives as able   Transaminitis  ?amio - stopped amio  Brief NSVT Pt experienced an apparent self-limited brief episode of NSVT on 11/6, for which amio was started - now off amio - TTE was w/o signif structural heart disease   Acute respiratory failure with hypoxemia Trach/vent care per PCCM  Hypernatremia  Cont free water admin via cortrak and follow   Obesity - Body mass index is 34.76 kg/m.  Possible adrenal insufficiency  Random AM cortisol very low at 1.5 on 11/09 but this was in presence of high dose ongoing decadron tx - will need to be  re-evaluated when pt off steroid tx   ?Hyperthyroidism TSH low at 0.233, but FT4 normal - will need recheck when not acutely ill   DM2 CBG well controlled off insulin gtt  AKI Renal fxn appears to be slowly improving - follow trend  Recent Labs  Lab 03/18/17 0415 03/19/17 0414 03/20/17 0233 03/22/17 0500 03/23/17 0353  CREATININE 2.44* 2.34* 2.23* 1.90* 1.89*    HTN BP modestly elevated  DVT prophylaxis: lovenox  Code Status: FULL CODE Family Communication: no family at bedside today   Disposition Plan: awaiting possible LTACH transfer - stable for SDU  Consultants:  PCCM ID Neurosurgery   Antimicrobials:  Ceftriaxone 10/26 > 11/12 Metronidazole 10/26  Vancomycin 10/26 > 10/27 Cefepime 11/12 >  Objective: Blood pressure (!) 158/78, pulse 71, temperature 98.7 F (37.1 C), temperature source Axillary, resp. rate (!) 36, height '5\' 10"'$  (1.778 m), weight 109.9 kg (242 lb 4.6 oz), SpO2 97 %.  Intake/Output Summary (Last 24 hours) at 03/23/2017 1710 Last data filed at 03/23/2017 1500 Gross per 24 hour  Intake 3589.5 ml  Output 2025 ml  Net 1564.5 ml   Filed Weights   03/21/17 0437 03/22/17 0413 03/23/17 0356  Weight: 115.1 kg (253 lb 12 oz) 112.7 kg (248 lb 7.3 oz) 109.9 kg (242 lb 4.6 oz)    Examination:   CBC: Recent Labs  Lab 03/18/17 0415 03/20/17 0233 03/22/17 0500 03/23/17 0925  WBC 21.4* 23.4* 24.4* 17.9*  NEUTROABS 19.5* 21.5*  --  15.3*  HGB 15.3 15.4 15.3 15.1  HCT 47.8 48.4 48.4 46.8  MCV 93.2 92.2 91.7 91.9  PLT 259 270 275 408   Basic Metabolic Panel: Recent Labs  Lab 03/18/17 0415 03/19/17 0414 03/20/17 0233 03/22/17 0500 03/23/17 0353 03/23/17 1522  NA 152* 149* 149* 149* 144 145  K 4.0 4.6 4.4 4.4 4.8  --   CL 119* 117* 118* 116* 113*  --   CO2 '26 25 25 26 25  '$ --   GLUCOSE 135* 164* 140* 181* 281*  --   BUN 56* 58* 58* 56* 54*  --   CREATININE 2.44* 2.34* 2.23* 1.90* 1.89*  --   CALCIUM 8.7* 8.6* 8.6* 8.6* 8.4*  --   MG  2.3  --   --   --  2.1  --   PHOS  --   --   --   --  4.0  --    GFR: Estimated Creatinine Clearance: 52.3 mL/min (A) (by C-G formula based on SCr of 1.89 mg/dL (H)).  Liver Function Tests: Recent Labs  Lab 03/19/17 0414 03/22/17 0500 03/23/17 0353  AST 49* 79* 55*  ALT 49 142* 132*  ALKPHOS 135* 181* 180*  BILITOT 0.3 0.3 0.4  PROT 6.1* 6.0* 5.7*  ALBUMIN 1.7* 1.8* 1.8*    Cardiac Enzymes: Recent Labs  Lab 03/16/17 1739  TROPONINI 1.65*    HbA1C: Hgb A1c MFr Bld  Date/Time Value Ref Range Status  03/12/2017 09:33 AM 9.1 (H) 4.8 - 5.6 % Final    Comment:    (NOTE) Pre diabetes:          5.7%-6.4% Diabetes:              >6.4% Glycemic control for   <7.0% adults with diabetes   03/06/2017 12:03 PM 9.9 (H) 4.8 - 5.6 % Final    Comment:    (NOTE) Pre diabetes:          5.7%-6.4% Diabetes:              >6.4% Glycemic control for   <7.0% adults with diabetes     CBG: Recent Labs  Lab 03/23/17 0315 03/23/17 0453 03/23/17 0757 03/23/17 1156 03/23/17 1611  GLUCAP 264* 216* 144* 145* 116*     Scheduled Meds: . aspirin  81 mg Per Tube Daily  . chlorhexidine gluconate (MEDLINE KIT)  15 mL Mouth Rinse BID  . Chlorhexidine Gluconate Cloth  6 each Topical Daily  . dexamethasone  2 mg Intravenous Q12H  . enoxaparin (LOVENOX) injection  40 mg Subcutaneous Q24H  . feeding supplement (PRO-STAT SUGAR FREE 64)  60 mL Per Tube TID  . free water  400 mL Per Tube Q4H  . insulin aspart  0-20 Units Subcutaneous Q4H  . [START ON 03/24/2017] insulin glargine  80 Units Subcutaneous Daily  . mouth rinse  15 mL Mouth Rinse 10 times per day  . metoprolol tartrate  25 mg Per Tube BID  . mupirocin cream   Topical BID  . pantoprazole sodium  40 mg Per Tube Daily  . sennosides  5 mL Per Tube QHS  . sodium chloride flush  10-40 mL Intracatheter Q12H     LOS: 18 days   Cherene Altes, MD [facility] Office  [phone] Pager - Text Page per Amion as per  below:  On-Call/Text Page:      [email or web address]      password [email or web address]  If 7PM-7AM, please contact night-coverage [email or web address] Password [email or web address] 03/23/2017,  5:10 PM

## 2017-03-23 NOTE — Progress Notes (Addendum)
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 Pt enjoys watching TV and going to high school football games. Pt currently total A with ADL and mobility. Maxisky used to lift pt to recliner. Pt inconsistently following 1 step commands; scanning to L field and helping to wipe mouth using L hand. Pt with interactive appropriate facial expressions. Began education with wife regarding decreasing amount of distractions when talking with him and giving him time to process information. Also educated on positioning of RUE and retrograde massage. Recommend CIR for rehab. Will follow acutely to address established goals to facilitate DC to next venue of care.   03/23/17 1136  OT Visit Information  Last OT Received On 03/23/17  Assistance Needed +2  PT/OT/SLP Co-Evaluation/Treatment Yes  Reason for Co-Treatment Complexity of the patient's impairments (multi-system involvement);For patient/therapist safety;To address functional/ADL transfers  OT goals addressed during session ADL's and self-care  History of Present Illness 61 y.o. male with medical history significant of diabetes mellitus type 2 and hypertension who presents with altered mentation and left basal ganglia abscess. Intubated 10/28, abscess aspirated 10/29, Trach 11/5  Precautions  Precautions Fall  Precaution Comments trach, Cortrak  Restrictions  Weight Bearing Restrictions No  Home Living  Family/patient expects to be discharged to: Private residence  Living Arrangements Spouse/significant other  Available Help at Discharge Family;Available 24 hours/day  Type of Home House  Home Access Stairs to enter  Entrance Stairs-Number of Steps 2-3  Entrance Stairs-Rails Right;Left  Home Layout One level  Bathroom Shower/Tub Tub/shower unit;Curtain  Horticulturist, commercialBathroom Toilet Standard  Bathroom Accessibility Yes  How Accessible Accessible via wheelchair;Accessible via  walker  Home Equipment None  Additional Comments Rental house  Prior Function  Level of Independence Independent  Comments Recently retired. Enjoys going ot football games. Independent with ADLs and wife does IADLs  Communication  Communication Tracheostomy  Pain Assessment  Pain Assessment Faces (CPOT= 0)  Faces Pain Scale 2  Pain Location discomfort with using Maxi sky  Pain Descriptors / Indicators Grimacing  Pain Intervention(s) Limited activity within patient's tolerance  Cognition  Arousal/Alertness Awake/alert  Behavior During Therapy Flat affect  Overall Cognitive Status Impaired/Different from baseline  Area of Impairment Attention;Following commands  Current Attention Level Sustained  Following Commands Follows one step commands inconsistently;Follows one step commands with increased time  General Comments pt with flat affect, not speaking, follows some commands inconsistently with delay  Difficult to assess due to Tracheostomy  Upper Extremity Assessment  Upper Extremity Assessment LUE deficits/detail  LUE Deficits / Details flaccid RUE; moderate edema  LUE Sensation (appears decreased? grimaced with noxious stimuli)  LUE Coordination decreased fine motor;decreased gross motor (nonfunctional at this time)  Lower Extremity Assessment  Lower Extremity Assessment Defer to PT evaluation  Cervical / Trunk Assessment  Cervical / Trunk Assessment Other exceptions  Cervical / Trunk Exceptions R bias  ADL  Overall ADL's  Needs assistance/impaired  Eating/Feeding NPO  Grooming Maximal assistance  Grooming Details (indicate cue type and reason) Pt held toothette suction and placed in mouth to assist with oral care with max A; able to wipe mouth with min A to support arm  General ADL Comments Total A at this time  Vision- History  Baseline Vision/History Wears glasses;Cataracts  Vision- Assessment  Vision Assessment? Vision impaired- to be further tested in functional  context  Additional Comments R gaze preference but would scan to L; not consistently  tracking object but would turn and look at objects of interest  Perception  Comments will further assess  Praxis  Praxis tested? Deficits  Deficits Initiation;Organization  Praxis-Other Comments will further assess  Bed Mobility  Overal bed mobility Needs Assistance  Bed Mobility Rolling  Rolling Mod assist  General bed mobility comments Pt able to roll toward R using L hand topull on rail. cues ot facilitate; Max A to roll toward L; facilitation through head and shoulders and RF knee/hips  Transfers  General transfer comment Maxisky used to lift to chair  Exercises  Exercises Other exercises  Other Exercises  Other Exercises Educated wife on positionoing of RUE to reduce dependent edema and retrograde massage  OT - End of Session  Equipment Utilized During Treatment Oxygen;Other (comment) (vent)  Activity Tolerance Patient tolerated treatment well  Patient left in chair;with call bell/phone within reach;with chair alarm set;with family/visitor present;with restraints reapplied  Nurse Communication Need for lift equipment;Mobility status  OT Assessment  OT Recommendation/Assessment Patient needs continued OT Services  OT Visit Diagnosis Other abnormalities of gait and mobility (R26.89);Muscle weakness (generalized) (M62.81);Low vision, both eyes (H54.2);Other symptoms and signs involving cognitive function;Hemiplegia and hemiparesis  Hemiplegia - Right/Left Right  Hemiplegia - dominant/non-dominant Dominant  Hemiplegia - caused by Unspecified (brain abscess)  OT Problem List Decreased strength;Decreased range of motion;Decreased activity tolerance;Impaired balance (sitting and/or standing);Impaired vision/perception;Decreased coordination;Decreased cognition;Decreased safety awareness;Decreased knowledge of use of DME or AE;Cardiopulmonary status limiting activity;Impaired sensation;Impaired  tone;Obesity;Impaired UE functional use;Increased edema  OT Plan  OT Frequency (ACUTE ONLY) Min 2X/week  OT Treatment/Interventions (ACUTE ONLY) Self-care/ADL training;Therapeutic exercise;Neuromuscular education;DME and/or AE instruction;Therapeutic activities;Cognitive remediation/compensation;Visual/perceptual remediation/compensation;Patient/family education;Balance training  AM-PAC OT "6 Clicks" Daily Activity Outcome Measure  Help from another person eating meals? 1  Help from another person taking care of personal grooming? 1  Help from another person toileting, which includes using toliet, bedpan, or urinal? 1  Help from another person bathing (including washing, rinsing, drying)? 1  Help from another person to put on and taking off regular upper body clothing? 1  Help from another person to put on and taking off regular lower body clothing? 1  6 Click Score 6  ADL G Code Conversion CN  OT Recommendation  Recommendations for Other Services Rehab consult  Follow Up Recommendations CIR;Supervision/Assistance - 24 hour  OT Equipment 3 in 1 bedside commode;Tub/shower bench;Wheelchair (measurements OT);Wheelchair cushion (measurements OT)  Individuals Consulted  Consulted and Agree with Results and Recommendations Family member/caregiver;Patient  Family Member Consulted wife  Acute Rehab OT Goals  Patient Stated Goal per wife - to get better and be able to go home  OT Goal Formulation With patient/family  Time For Goal Achievement 04/06/17  Potential to Achieve Goals Good  OT Time Calculation  OT Start Time (ACUTE ONLY) 1059  OT Stop Time (ACUTE ONLY) 1137  OT Time Calculation (min) 38 min  OT Evaluation  $OT Eval High Complexity 1 High  Written Expression  Dominant Hand Right  [facility] Biven, OT/L  [phone] 03/23/2017

## 2017-03-23 NOTE — Progress Notes (Signed)
Nutrition Follow-up  DOCUMENTATION CODES:   Obesity unspecified  INTERVENTION:   D/C Vital High Protein  Glucerna 1.2 @ 30 ml/hr (720 ml/day) 60 ml Prostat TID Provides: 1464 kcal, 133 grams protein, 583 ml free water, and 142 grams carbohydrate.  Total free water: 2983 ml   NUTRITION DIAGNOSIS:   Inadequate oral intake related to inability to eat as evidenced by NPO status. Ongoing.   GOAL:   Provide needs based on ASPEN/SCCM guidelines Met.   MONITOR:   Vent status, TF tolerance, Weight trends, Labs  ASSESSMENT:   61 y.o. male with medical history significant of diabetes mellitus type 2 and hypertension who presents with altered mentation. For last 48 hours patient has been developing progressive confusion and disorientation, to the point where he was noted to have dis-balance, falls and hallucinations. His confusion is moderate to severe in intensity, it was associated with slurred speech and right upper extremity weakness  Pt discussed during ICU rounds and with RN.  Spoke with MD, ok to change TF as appropriate. MD wants to continue free water flushes.    Pt is 7.9 L+ with 2-3+ edema  11/5 trach, cortrak placed (stomach)  Medications reviewed and include: decadron, novolog, lantus 70 u daily, senokot Free water: 400 ml every 4 hours TF: Vital High Protein @ 55 with 30 ml prostat daily: provides: 1420 kcal, 130 grams protein, 1103 ml free water, 157 grams carbohydrate.  Labs reviewed: glucose 281, BUN/Cr 54/1.89 (H) - these are trending down, Na now 144 WNL CBG's: [phone]   Diet Order:  No diet orders on file  EDUCATION NEEDS:   Not appropriate for education at this time  Skin:  Skin Assessment: Skin Integrity Issues: Skin Integrity Issues:: Incisions, Other (Comment) Incisions: head Other: non-pressure wound to R leg, skin peeling  Last BM:  11/12  Height:   Ht Readings from Last 1 Encounters:  03/10/17 _0  (1.778 m)    Weight:   Wt  Readings from Last 1 Encounters:  03/23/17 242 lb 4.6 oz (109.9 kg)    Ideal Body Weight:  64.54 kg  BMI:  Body mass index is 34.76 kg/m.  Estimated Nutritional Needs:   Kcal:  1211-1541 (11-14 kcal/kg)  Protein:  129 grams (2 grams/kg)  Fluid:  >/= 1.5 L/day    Maylon Peppers RD, LDN, CNSC [phone] Pager [phone] After Hours Pager

## 2017-03-24 DIAGNOSIS — J151 Pneumonia due to Pseudomonas: Secondary | ICD-10-CM

## 2017-03-24 LAB — BLOOD GAS, ARTERIAL
Acid-Base Excess: 1.5 mmol/L (ref 0.0–2.0)
Bicarbonate: 24.9 mmol/L (ref 20.0–28.0)
DRAWN BY: 129711
FIO2: 50
O2 SAT: 95.1 %
PATIENT TEMPERATURE: 98.6
pCO2 arterial: 34.7 mmHg (ref 32.0–48.0)
pH, Arterial: 7.469 — ABNORMAL HIGH (ref 7.350–7.450)
pO2, Arterial: 75.5 mmHg — ABNORMAL LOW (ref 83.0–108.0)

## 2017-03-24 LAB — BASIC METABOLIC PANEL
ANION GAP: 7 (ref 5–15)
BUN: 55 mg/dL — ABNORMAL HIGH (ref 6–20)
CHLORIDE: 115 mmol/L — AB (ref 101–111)
CO2: 24 mmol/L (ref 22–32)
Calcium: 8.3 mg/dL — ABNORMAL LOW (ref 8.9–10.3)
Creatinine, Ser: 1.67 mg/dL — ABNORMAL HIGH (ref 0.61–1.24)
GFR calc non Af Amer: 43 mL/min — ABNORMAL LOW (ref >60)
GFR, EST AFRICAN AMERICAN: 50 mL/min — AB (ref >60)
Glucose, Bld: 179 mg/dL — ABNORMAL HIGH (ref 65–99)
Potassium: 4.4 mmol/L (ref 3.5–5.1)
Sodium: 146 mmol/L — ABNORMAL HIGH (ref 135–145)

## 2017-03-24 LAB — CULTURE, RESPIRATORY W GRAM STAIN

## 2017-03-24 LAB — CBC WITH DIFFERENTIAL/PLATELET
BASOS ABS: 0 10*3/uL (ref 0.0–0.1)
Basophils Relative: 0 %
Eosinophils Absolute: 0 10*3/uL (ref 0.0–0.7)
Eosinophils Relative: 0 %
HEMATOCRIT: 47.5 % (ref 39.0–52.0)
Hemoglobin: 15.3 g/dL (ref 13.0–17.0)
LYMPHS PCT: 7 %
Lymphs Abs: 1.2 10*3/uL (ref 0.7–4.0)
MCH: 29.5 pg (ref 26.0–34.0)
MCHC: 32.2 g/dL (ref 30.0–36.0)
MCV: 91.7 fL (ref 78.0–100.0)
Monocytes Absolute: 0.8 10*3/uL (ref 0.1–1.0)
Monocytes Relative: 5 %
NEUTROS ABS: 15.6 10*3/uL — AB (ref 1.7–7.7)
NEUTROS PCT: 88 %
PLATELETS: 238 10*3/uL (ref 150–400)
RBC: 5.18 MIL/uL (ref 4.22–5.81)
RDW: 15.3 % (ref 11.5–15.5)
WBC: 17.6 10*3/uL — AB (ref 4.0–10.5)

## 2017-03-24 LAB — GLUCOSE, CAPILLARY
GLUCOSE-CAPILLARY: 143 mg/dL — AB (ref 65–99)
GLUCOSE-CAPILLARY: 161 mg/dL — AB (ref 65–99)
Glucose-Capillary: 183 mg/dL — ABNORMAL HIGH (ref 65–99)
Glucose-Capillary: 166 mg/dL — ABNORMAL HIGH (ref 65–99)
Glucose-Capillary: 172 mg/dL — ABNORMAL HIGH (ref 65–99)
Glucose-Capillary: 155 mg/dL — ABNORMAL HIGH (ref 65–99)

## 2017-03-24 LAB — MAGNESIUM: Magnesium: 2 mg/dL (ref 1.7–2.4)

## 2017-03-24 LAB — CULTURE, RESPIRATORY

## 2017-03-24 LAB — PHOSPHORUS: PHOSPHORUS: 3.7 mg/dL (ref 2.5–4.6)

## 2017-03-24 MED ORDER — FREE WATER: 400.0000 mL | Status: AC

## 2017-03-24 MED ORDER — MUPIROCIN CALCIUM 2 % EX CREA: TOPICAL_CREAM | Freq: Two times a day (BID) | CUTANEOUS | 0 refills | Status: AC

## 2017-03-24 MED ORDER — HYDRALAZINE HCL 20 MG/ML IJ SOLN: 10.0000 mg | INTRAMUSCULAR | Status: AC | PRN

## 2017-03-24 MED ORDER — METOPROLOL TARTRATE 25 MG/10 ML ORAL SUSPENSION: 25.0000 mg | Freq: Two times a day (BID) | ORAL | Status: AC

## 2017-03-24 MED ORDER — SENNOSIDES 8.8 MG/5ML PO SYRP: 5.0000 mL | ORAL_SOLUTION | Freq: Every day | ORAL | 0 refills | Status: AC

## 2017-03-24 MED ORDER — SODIUM CHLORIDE 0.9 % IV SOLN
2.0000 g | Freq: Three times a day (TID) | INTRAVENOUS | Status: DC
  Administered 2017-03-24 (×2): 2 g via INTRAVENOUS
  Filled 2017-03-24 (×3): qty 2

## 2017-03-24 MED ORDER — BISACODYL 10 MG RE SUPP: 10.0000 mg | Freq: Every day | RECTAL | 0 refills | Status: AC | PRN

## 2017-03-24 MED ORDER — SODIUM CHLORIDE 0.9 % IV SOLN: 2.0000 g | Freq: Three times a day (TID) | INTRAVENOUS | Status: AC

## 2017-03-24 MED ORDER — CHLORHEXIDINE GLUCONATE 0.12% ORAL RINSE (MEDLINE KIT): 15.0000 mL | Freq: Two times a day (BID) | OROMUCOSAL | 0 refills | Status: AC

## 2017-03-24 MED ORDER — CHLORHEXIDINE GLUCONATE CLOTH 2 % EX PADS: 6.0000 | MEDICATED_PAD | Freq: Every day | CUTANEOUS | Status: AC

## 2017-03-24 MED ORDER — METOPROLOL TARTRATE 5 MG/5ML IV SOLN: 5.0000 mg | Freq: Four times a day (QID) | INTRAVENOUS | Status: AC | PRN

## 2017-03-24 MED ORDER — INSULIN ASPART 100 UNIT/ML ~~LOC~~ SOLN: 0.0000 [IU] | SUBCUTANEOUS | 11 refills | Status: AC

## 2017-03-24 MED ORDER — ASPIRIN 81 MG PO CHEW: 81.0000 mg | CHEWABLE_TABLET | Freq: Every day | ORAL | Status: AC

## 2017-03-24 MED ORDER — DEXAMETHASONE SODIUM PHOSPHATE 4 MG/ML IJ SOLN: 2.0000 mg | Freq: Two times a day (BID) | INTRAMUSCULAR | Status: AC

## 2017-03-24 MED ORDER — SODIUM CHLORIDE 0.9 % IV SOLN: 750.0000 mg | Freq: Two times a day (BID) | INTRAVENOUS | Status: AC

## 2017-03-24 MED ORDER — SODIUM CHLORIDE 0.9 % IV SOLN: 10.0000 mL | INTRAVENOUS | 0 refills | Status: AC

## 2017-03-24 MED ORDER — PRO-STAT SUGAR FREE PO LIQD: 60.0000 mL | Freq: Three times a day (TID) | ORAL | 0 refills | Status: AC

## 2017-03-24 MED ORDER — ENOXAPARIN SODIUM 40 MG/0.4ML ~~LOC~~ SOLN: 40.0000 mg | SUBCUTANEOUS | Status: AC

## 2017-03-24 MED ORDER — SODIUM CHLORIDE 0.9% FLUSH: 10.0000 mL | INTRAVENOUS | Status: AC | PRN

## 2017-03-24 MED ORDER — DOCUSATE SODIUM 50 MG/5ML PO LIQD: 100.0000 mg | Freq: Two times a day (BID) | ORAL | 0 refills | Status: AC | PRN

## 2017-03-24 MED ORDER — HYDROCORTISONE ACETATE 25 MG RE SUPP: 25.0000 mg | Freq: Two times a day (BID) | RECTAL | 0 refills | Status: AC

## 2017-03-24 MED ORDER — HYDROCORTISONE ACETATE 25 MG RE SUPP
25.0000 mg | Freq: Two times a day (BID) | RECTAL | Status: DC
  Administered 2017-03-24: 25 mg via RECTAL
  Filled 2017-03-24 (×2): qty 1

## 2017-03-24 MED ORDER — PANTOPRAZOLE SODIUM 40 MG PO PACK: 40.0000 mg | PACK | Freq: Every day | ORAL | Status: AC

## 2017-03-24 MED ORDER — GLUCERNA 1.2 CAL PO LIQD: 1000.0000 mL | ORAL | Status: AC

## 2017-03-24 MED ORDER — INSULIN GLARGINE 100 UNIT/ML ~~LOC~~ SOLN: 80.0000 [IU] | Freq: Every day | SUBCUTANEOUS | 11 refills | Status: AC

## 2017-03-24 MED ORDER — SODIUM CHLORIDE 0.9% FLUSH: 10.0000 mL | Freq: Two times a day (BID) | INTRAVENOUS | Status: AC

## 2017-03-24 NOTE — Progress Notes (Signed)
    [facility]   Reason for visit: Follow up on brain abscess  Interval History: no acute changes.    Day 20 total antibiotics Day 1 meropenem   Physical Exam: Constitutional:  Vitals:   03/24/17 0836 03/24/17 0900  BP:  (!) 158/80  Pulse:  77  Resp:  (!) 25  Temp:    SpO2: 95% 97%   patient is awake, alert Eyes: anicteric,open HENT: trach Respiratory: on vent Cardiovascular: RRR GI: soft, nt, nd Neuro: moves left arm and leg, not right; looks at me on command  Review of Systems: Unable to be assessed due to mental status  Lab Results  Component Value Date   WBC 17.6 (H) 03/24/2017   HGB 15.3 03/24/2017   HCT 47.5 03/24/2017   MCV 91.7 03/24/2017   PLT 238 03/24/2017    Lab Results  Component Value Date   CREATININE 1.67 (H) 03/24/2017   BUN 55 (H) 03/24/2017   NA 146 (H) 03/24/2017   K 4.4 03/24/2017   CL 115 (H) 03/24/2017   CO2 24 03/24/2017    Lab Results  Component Value Date   ALT 132 (H) 03/23/2017   AST 55 (H) 03/23/2017   ALKPHOS 180 (H) 03/23/2017     Microbiology: Recent Results (from the past 240 hour(s))  Culture, respiratory (NON-Expectorated)     Status: None   Collection Time: 03/22/17 10:51 AM  Result Value Ref Range Status   Specimen Description TRACHEAL ASPIRATE  Final   Special Requests NONE  Final   Gram Stain   Final    DEGENERATED CELLULAR MATERIAL PRESENT RARE SQUAMOUS EPITHELIAL CELLS PRESENT MODERATE GRAM NEGATIVE RODS RARE GRAM POSITIVE RODS RARE GRAM POSITIVE COCCI IN PAIRS    Culture ABUNDANT PSEUDOMONAS AERUGINOSA  Final   Report Status 03/24/2017 FINAL  Final   Organism ID, Bacteria PSEUDOMONAS AERUGINOSA  Final      Susceptibility   Pseudomonas aeruginosa - MIC*    CEFTAZIDIME >=64 RESISTANT Resistant     CIPROFLOXACIN <=0.25 SENSITIVE Sensitive     GENTAMICIN 2 SENSITIVE Sensitive     IMIPENEM 2 SENSITIVE Sensitive     CEFEPIME >=64 RESISTANT Resistant     * ABUNDANT PSEUDOMONAS  AERUGINOSA    Impression/Plan:  1. Brain abscess - on meropenem now for brain abscess and Pneumonia.  High dose for both.     2.  Pneumonia - Pseudomonas positive, increased secretions.  Cefepime resistant.  Will use meropenem as above for pneumonia and brain abscess.    3. Leukocytosis - WBC down to 17.6.  Partly from decadron which is being weaned.  Also with pneumonia.  Will continue to monitor.

## 2017-03-24 NOTE — Discharge Summary (Addendum)
DISCHARGE SUMMARY  DURIEL DEERY  MR#: [id number]  DOB:1955-05-16  Date of Admission: 03/05/2017 Date of Discharge: 03/24/2017  Attending Physician:MCCLUNG,JEFFREY T  Patient's NWG:NFAOZHY, No Pcp Per  Consults: PCCM ID Neurosurgery   Disposition: D/C to LTACH  Follow-up Appts: to be determined at such time pt is able to be d/c from the [facility]  Tests Needing Follow-up: -continue to wean decadron to off -transition abx from merem to cefriaxone for long term tx of brain abscess once PNA adequately treated  -follow Na and titrate free water via feeding tube prn -re-evaluate adrenal function after pt fully clear of steroid tx  -re-evaluate thyroid function once pt medically stable    Discharge Diagnoses: L basal ganglia Brain abscess - Strep Viridans  Pseudomonas PNA Acute encephalopathy Transaminitis  Brief NSVT Acute respiratory failure with hypoxemia Hypernatremia  Obesity - Body mass index is 34.76 kg/m. Possible adrenal insufficiency  ?Hyperthyroidism DM2 AKI HTN  Initial presentation: 61 year old male w/ hx of DM and HTN and dental extraction 30 days prior who was admitted with a brain abscess, and then developed encephalopathy requiring intubation. He underwent percutaneous tracheostomy 11/5. Early 11/6 he had an episode of NSVT. He was started on amiodarone and has maintained NSR since.  Hospital Course: 10/26  admit w/ AMS - ED w/u revealed a brain abscess 10/28 Progressive decline in MS > intubation 10/29 Fever and shock overnight, requiring addition of phenylephrine gtt - Brain abscess aspiration 10/30 Off vasopressors 10/31 No weaning, no change in neuro exam 11/02 Increased O2 needs overnight, resolved with increased PEEP 11/06  NSVT - amio started  11/11  TRH assumed care   L basal ganglia Brain abscess - Strep Viridans  S/p drainage per NS - abx as directed by ID - decadron being weaned - transition back to ceftriaxone for prolonged tx  after completes course of cefepime for PNA   Multidrug resistant Pseudomonas PNA As per ID "Pseudomonas positive, increased secretions.  Cefepime resistant.  Will use meropenem as above for pneumonia and brain abscess."   Acute encephalopathy EEG 11/7 negative for seizure - unclear to what extent pt will recover, but recovery will clearly be a protracted issue if it occurs - cont to wean steroids - avoid sedatives as able   Transaminitis  ?amio - stopped amio - LFTs improving   Brief NSVT Pt experienced an apparent self-limited brief episode of NSVT on 11/6, for which amio was started - now off amio - TTE was w/o signif structural heart disease   Acute respiratory failure with hypoxemia Trach/vent care per PCCM during acute stay - d/c to [facility] for ongoing vent care/weaning   Hypernatremia  Cont free water admin via cortrak and follow   Obesity - Body mass index is 34.76 kg/m.  Possible adrenal insufficiency  Random AM cortisol very low at 1.5 on 11/09 but this was in presence of high dose ongoing decadron tx - will need to be re-evaluated when pt off steroid tx   ?Hyperthyroidism TSH low at 0.233, but FT4 normal - will need recheck when not acutely ill   DM2 CBG reasonably controlled off insulin gtt  AKI Renal fxn continues to slowly improve - follow trend   HTN BP modestly elevated   Allergies as of 03/24/2017   No Known Allergies     Medication List    STOP taking these medications   aspirin EC 81 MG tablet Replaced by:  aspirin 81 MG chewable tablet   LIPITOR 40 MG tablet Generic  drug:  atorvastatin   lisinopril 20 MG tablet Commonly known as:  PRINIVIL,ZESTRIL   metFORMIN 1000 MG tablet Commonly known as:  GLUCOPHAGE     TAKE these medications   aspirin 81 MG chewable tablet Place 1 tablet (81 mg total) daily into feeding tube. Start taking on:  03/13/2017 Replaces:  aspirin EC 81 MG tablet   bisacodyl 10 MG suppository Commonly known as:   DULCOLAX Place 1 suppository (10 mg total) daily as needed rectally for moderate constipation.   chlorhexidine gluconate (MEDLINE KIT) 0.12 % solution Commonly known as:  PERIDEX 15 mLs 2 (two) times daily by Mouth Rinse route.   Chlorhexidine Gluconate Cloth 2 % Pads Apply 6 each daily topically. Start taking on:  03/13/2017   dexamethasone 4 MG/ML injection Commonly known as:  DECADRON Inject 0.5 mLs (2 mg total) every 12 (twelve) hours into the vein.   docusate 50 MG/5ML liquid Commonly known as:  COLACE Place 10 mLs (100 mg total) 2 (two) times daily as needed into feeding tube for mild constipation.   enoxaparin 40 MG/0.4ML injection Commonly known as:  LOVENOX Inject 0.4 mLs (40 mg total) daily into the skin. Start taking on:  03/12/2017   feeding supplement (GLUCERNA 1.2 CAL) Liqd Place 1,000 mLs continuous into feeding tube.   feeding supplement (PRO-STAT SUGAR FREE 64) Liqd Place 60 mLs 3 (three) times daily into feeding tube.   free water Soln Place 400 mLs every 4 (four) hours into feeding tube.   hydrALAZINE 20 MG/ML injection Commonly known as:  APRESOLINE Inject 0.5 mLs (10 mg total) every 4 (four) hours as needed into the vein (systolic greater than 734 mmHg).   hydrocortisone 25 MG suppository Commonly known as:  ANUSOL-HC Place 1 suppository (25 mg total) 2 (two) times daily rectally.   insulin aspart 100 UNIT/ML injection Commonly known as:  novoLOG Inject 0-20 Units every 4 (four) hours into the skin.   insulin glargine 100 UNIT/ML injection Commonly known as:  LANTUS Inject 0.8 mLs (80 Units total) daily into the skin. Start taking on:  03/21/2017 What changed:    how much to take  when to take this   levETIRAcetam 750 mg in sodium chloride 0.9 % 100 mL Inject 750 mg every 12 (twelve) hours into the vein.   meropenem 2 g in sodium chloride 0.9 % 100 mL Inject 2 g every 8 (eight) hours into the vein.   metoprolol tartrate 25 mg/10 mL  Susp Commonly known as:  LOPRESSOR Place 10 mLs (25 mg total) 2 (two) times daily into feeding tube.   metoprolol tartrate 5 MG/5ML Soln injection Commonly known as:  LOPRESSOR Inject 5 mLs (5 mg total) every 6 (six) hours as needed into the vein (SBP > 180).   mupirocin cream 2 % Commonly known as:  BACTROBAN Apply 2 (two) times daily topically.   pantoprazole sodium 40 mg/20 mL Pack Commonly known as:  PROTONIX Place 20 mLs (40 mg total) daily into feeding tube. Start taking on:  03/17/2017   sennosides 8.8 MG/5ML syrup Commonly known as:  SENOKOT Place 5 mLs at bedtime into feeding tube.   sodium chloride 0.9 % infusion Inject 10 mLs continuous into the vein.   sodium chloride flush 0.9 % Soln Commonly known as:  NS 10-40 mLs every 12 (twelve) hours by Intracatheter route.   sodium chloride flush 0.9 % Soln Commonly known as:  NS 10-40 mLs as needed by Intracatheter route (flush).  Day of Discharge BP (!) 158/80   Pulse 77   Temp 98 F (36.7 C) (Oral)   Resp (!) 25   Ht '5\' 10"'$  (1.778 m) Comment: per family  Wt 109.9 kg (242 lb 4.6 oz)   SpO2 97%   BMI 34.76 kg/m   Physical Exam: General: No acute respiratory distress Lungs: Clear to auscultation bilaterally Cardiovascular: Regular rate and rhythm  Abdomen: Nondistended, soft, no rebound Extremities: Trace edema bilateral lower extremities  Basic Metabolic Panel: Recent Labs  Lab 03/18/17 0415 03/19/17 0414 03/20/17 0233 03/22/17 0500 03/23/17 0353 03/23/17 1522 03/24/17 0500  NA 152* 149* 149* 149* 144 145 146*  K 4.0 4.6 4.4 4.4 4.8  --  4.4  CL 119* 117* 118* 116* 113*  --  115*  CO2 '26 25 25 26 25  '$ --  24  GLUCOSE 135* 164* 140* 181* 281*  --  179*  BUN 56* 58* 58* 56* 54*  --  55*  CREATININE 2.44* 2.34* 2.23* 1.90* 1.89*  --  1.67*  CALCIUM 8.7* 8.6* 8.6* 8.6* 8.4*  --  8.3*  MG 2.3  --   --   --  2.1  --  2.0  PHOS  --   --   --   --  4.0  --  3.7    Liver Function  Tests: Recent Labs  Lab 03/19/17 0414 03/22/17 0500 03/23/17 0353  AST 49* 79* 55*  ALT 49 142* 132*  ALKPHOS 135* 181* 180*  BILITOT 0.3 0.3 0.4  PROT 6.1* 6.0* 5.7*  ALBUMIN 1.7* 1.8* 1.8*    CBC: Recent Labs  Lab 03/18/17 0415 03/20/17 0233 03/22/17 0500 03/23/17 0925 03/24/17 0500  WBC 21.4* 23.4* 24.4* 17.9* 17.6*  NEUTROABS 19.5* 21.5*  --  15.3* 15.6*  HGB 15.3 15.4 15.3 15.1 15.3  HCT 47.8 48.4 48.4 46.8 47.5  MCV 93.2 92.2 91.7 91.9 91.7  PLT 259 270 275 253 238    CBG: Recent Labs  Lab 03/23/17 1611 03/23/17 1937 03/23/17 2327 03/24/17 0307 03/24/17 0806  GLUCAP 116* 150* 163* 183* 166*    Recent Results (from the past 240 hour(s))  Culture, respiratory (NON-Expectorated)     Status: None   Collection Time: 03/22/17 10:51 AM  Result Value Ref Range Status   Specimen Description TRACHEAL ASPIRATE  Final   Special Requests NONE  Final   Gram Stain   Final    DEGENERATED CELLULAR MATERIAL PRESENT RARE SQUAMOUS EPITHELIAL CELLS PRESENT MODERATE GRAM NEGATIVE RODS RARE GRAM POSITIVE RODS RARE GRAM POSITIVE COCCI IN PAIRS    Culture ABUNDANT PSEUDOMONAS AERUGINOSA  Final   Report Status 03/24/2017 FINAL  Final   Organism ID, Bacteria PSEUDOMONAS AERUGINOSA  Final      Susceptibility   Pseudomonas aeruginosa - MIC*    CEFTAZIDIME >=64 RESISTANT Resistant     CIPROFLOXACIN <=0.25 SENSITIVE Sensitive     GENTAMICIN 2 SENSITIVE Sensitive     IMIPENEM 2 SENSITIVE Sensitive     CEFEPIME >=64 RESISTANT Resistant     * ABUNDANT PSEUDOMONAS AERUGINOSA     Time spent in discharge (includes decision making & examination of pt): 35 minutes  03/24/2017, 12:26 PM   Cherene Altes, MD [facility] Office  [phone] Pager [phone]  On-Call/Text Page:      [email or web address]      password [facility]

## 2017-03-24 NOTE — Progress Notes (Signed)
[facility]  [facility] will continue to follow pt in CIR to support Home Infusion needs if ordered at DC to home.  If patient discharges after hours, please call [phone].   Sedalia Mutaamela S Chandler 03/24/2017, 12:31 PM

## 2017-03-24 NOTE — Care Management Note (Signed)
Case Management Note  Patient Details  Name: Timothy Jordan MRN: [id number] Date of Birth: 1956/02/12  Subjective/Objective:  Pt admitted on 03/05/17 with neurological decline; found to have Lt basal ganglia abscesses.  PTA, pt resided at home with spouse; has two sons.                     Action/Plan: Pt to OR today for stereotactic aspiration of lesions.  Will follow post-op for discharge needs.    Expected Discharge Date:  03/24/17               Expected Discharge Plan:  Long Term Acute Care (LTAC)  In-House Referral:     Discharge planning Services  CM Consult  Post Acute Care Choice:  Long Term Acute Care (LTAC) Choice offered to:  Spouse  DME Arranged:    DME Agency:     HH Arranged:    HH Agency:     Status of Service:  Completed, signed off  If discussed at [employer] Length of Stay Meetings, dates discussed:    Additional Comments:  03/24/17 J. Aracelli Woloszyn, RN, [employer] [facility]  Family made aware of bed availability.  Pt to dc to room 5E10, Dr Luna KitchensStephanie Brown accepting.  Bedside nurse to call report to [phone].    Quintella BatonJulie W. Dilpreet Faires, RN, BSN  Trauma/Neuro ICU Case Manager [phone]

## 2017-03-24 NOTE — Progress Notes (Signed)
PULMONARY / CRITICAL CARE MEDICINE   Name: Timothy Jordan MRN: [id number] DOB: 22-Jul-1955    ADMISSION DATE:  03/05/2017 CONSULTATION DATE: 03/07/17  CHIEF COMPLAINT: Brain abscess, Ventilator management  HISTORY OF PRESENT ILLNESS:   10yoM with h/ol DM, admitted 10/26 with AMS and found to have brain abscesses (strep viridans) who developed acute encephalopathy and required intubation. He is s/p percutaneous tracheostomy 11/5. Has episode of SVT on 11/7 started on Amiodarone. He has continued to require mechanical ventilation. Tolerated PSV trial for 11hrs (8am-7pm) on 03/21/17. Started back on PSV on 11/12 AM and as of now has been on it continuously for 24hrs and is doing well. ABG this AM is still pending. Had copious thick creamy secretions from trach on 11/12; since broadening antibiotics yesterday his secretions has since resolved and leukocytosis improved. Transitioned off insulin gtt on 11/12 and restarted on Lantus and Aggressive SSI. Remains hyperglycemic in 200's today.   SUBJECTIVE:  Sputum + Pseudomonas, resistant to cefepime TC x 4 hours 11/13, resting on PSV 10/5, 50%  VITAL SIGNS: BP (!) 158/80   Pulse 77   Temp 98 F (36.7 C) (Oral)   Resp (!) 25   Ht _0  (1.778 m) Comment: per family  Wt 242 lb 4.6 oz (109.9 kg)   SpO2 97%   BMI 34.76 kg/m   VENTILATOR SETTINGS: Vent Mode: PSV;CPAP FiO2 (%):  [40 %] 40 % PEEP:  [5 cmH20] 5 cmH20 Pressure Support:  [10 cmH20] 10 cmH20 Plateau Pressure:  [16 cmH20-18 cmH20] 18 cmH20  INTAKE / OUTPUT: I/O last 3 completed shifts: In: 3917 [I.V.:390; NG/GT:3054.5; IV Piggyback:472.5] Out: 3329 [Urine:5325]  PHYSICAL EXAMINATION: General:  Adult male sitting in bedside recliner in NAD HEENT: MM pink/moist, 6 Shiley cuffed trach- minimal secretions Neuro: somnolent, more today but just finished working with PT, some movement on left, ? Following commands on left, facial grimace to pain CV: s1s2 rrr, no m/r/g PULM:  even/non-labored on MV, lungs bilaterally coarse GI: soft,, round, BS active  Extremities: warm/dry, trace BLE edema  Skin: no rashes   LABS:  BMET Recent Labs  Lab 03/22/17 0500 03/23/17 0353 03/23/17 1522 03/24/17 0500  NA 149* 144 145 146*  K 4.4 4.8  --  4.4  CL 116* 113*  --  115*  CO2 26 25  --  24  BUN 56* 54*  --  55*  CREATININE 1.90* 1.89*  --  1.67*  GLUCOSE 181* 281*  --  179*    Electrolytes Recent Labs  Lab 03/18/17 0415  03/22/17 0500 03/23/17 0353 03/24/17 0500  CALCIUM 8.7*   < > 8.6* 8.4* 8.3*  MG 2.3  --   --  2.1 2.0  PHOS  --   --   --  4.0 3.7   < > = values in this interval not displayed.    CBC Recent Labs  Lab 03/22/17 0500 03/23/17 0925 03/24/17 0500  WBC 24.4* 17.9* 17.6*  HGB 15.3 15.1 15.3  HCT 48.4 46.8 47.5  PLT 275 253 238   Coag's No results for input(s): APTT, INR in the last 168 hours.  Sepsis Markers Recent Labs  Lab 03/22/17 1230 03/23/17 0353  PROCALCITON 0.20 0.18   ABG Recent Labs  Lab 03/22/17 0908 03/22/17 1058 03/23/17 1040  PHART 7.445 7.456* 7.448  PCO2ART 38.7 38.5 36.0  PO2ART 116.0* 83.0 123*   Liver Enzymes Recent Labs  Lab 03/19/17 0414 03/22/17 0500 03/23/17 0353  AST 49* 79* 55*  ALT  49 142* 132*  ALKPHOS 135* 181* 180*  BILITOT 0.3 0.3 0.4  ALBUMIN 1.7* 1.8* 1.8*   Cardiac Enzymes No results for input(s): TROPONINI, PROBNP in the last 168 hours. Glucose Recent Labs  Lab 03/23/17 1156 03/23/17 1611 03/23/17 1937 03/23/17 2327 03/24/17 0307 03/24/17 0806  GLUCAP 145* 116* 150* 163* 183* 166*   Imaging No results found.  STUDIES: ECHO 10/27 >> study stopped prematurely due to pt movement, mild LVH, LVEF 55-60% CT Head 10/28 >> Stable to slight enlargement of left basal ganglia masses compatible with abscesses on MRI. Worsening surrounding edema with 5 mm of rightward midline shift Renal US 10/29 >> complex left-sided renal cysts, no obstructive uropathy, trace  perinephric fluid is seen about both kidneys  CULTURES: BCx2 10/26 >> negative Sputum 10/28 >>normal flora  UA 10/26 >> negative HIV 10/26 >>negative  BCx2 10/28 >> GPC pairs 1/2  Grew strep viridans BAL 11/1 >>rare yeast Sputum 11/12 >> pseudomonas - resistant to cefepime  ANTIBIOTICS: Ceftriaxone 10/26 >>11/12 Metronidazole 10/26 >>discontinued Vancomycin 10/26 >> 10/27 Cefepime 11/12 >>11/14 Meropenem 11/14 >>  SIGNIFICANT EVENTS: 10/28 Progressive decline in MS requiring intubation 10/29 Fever and shock overnight, requiring addition of phenylephrine gtt. Brain abscess aspiration 10/30 Off vasopressors 10/31 No weaning, no change in neuro exam 11/02 Increased O2 needs overnight, resolved with increased PEEP 11/11  Tolerated 11hrs of PSV (PS 10, PEEP 8); placed back on full support vent settings as it was the plan, not because patient tired out 11/12  Started PSV (PS 10, PEEP 8) at 9am with plan to remain on continuous PSV as tolerates; Copious purulent secretions from [facility] 11/13  Tolerating PSV well; Much less secretions from trach, TC x 4 hours 11/14   Sputum cx pseudomonas, d/c cefepime (resistant), day 1 meropenem  LINES/TUBES: ETT 10/28 >>11/5 PICC 11/1 (IR) >> Perc Trach 11/5  ASSESSMENT / PLAN:  Acute Hypoxic Respiratory failure; Aspiration pneumonia: - s/p perc trach 11/5 - improving secretions - On 4 hours TC 11/13 with ABG to ensure no hypercapnia  P:  Continue PSV for rest with goal of TC during the day Trach care protocol Trend CXR as needed  D/c cefepime as culture pseudomonas positive and cefepime resistant.  Placed on high dose meropenem per ID to cover both aspiration pna and strep viradians brain abscesses  Continue chest PT q4hrs while awake Monitor electrolytes/ phos   DVT prophylaxis:  SUP: PPI per tube Diet: Tube feeds at goal  Activity: OOB to chair w/PT Disposition : pending LTAC placement,  possibly going to [facility] today.    Remainder per primary team.  PCCM will continue to follow for trach and MV issues.    Kennieth Rad, AGACNP-BC [facility] Pgr: [phone] or if no answer [phone] 03/24/2017, 12:07 PM

## 2017-03-24 NOTE — Plan of Care (Signed)
Pt. [facility]

## 2017-03-24 NOTE — Progress Notes (Signed)
Occupational Therapy Treatment Patient Details Name: Timothy Jordan MRN: [id number] DOB: Sep 10, 1955 Today's Date: 03/24/2017    History of present illness 61 y.o. male with medical history significant of diabetes mellitus type 2 and hypertension who presents with altered mentation and left basal ganglia abscess. Intubated 10/28, abscess aspirated 10/29, Trach 11/5   OT comments  Pt participated in bed mobility prior to using Maxisky to lift pt to chair. Pt opening eyes briefly. More lethargic today during OT although respiratory states pt was awake and alert earlier today. Pt will benefit from CIR after LTACH. Will continue to follow acutely.    Follow Up Recommendations  CIR;Supervision/Assistance - 24 hour;LTACH    Equipment Recommendations  3 in 1 bedside commode;Tub/shower bench;Wheelchair (measurements OT);Wheelchair cushion (measurements OT)    Recommendations for Other Services Rehab consult    Precautions / Restrictions Precautions Precautions: Fall Precaution Comments: trach, Cortrak Restrictions Weight Bearing Restrictions: No       Mobility Bed Mobility     Rolling: Mod assist;Max assist         General bed mobility comments: Pt able to roll toward R using L hand topull on rail. cues ot facilitate; Max A to roll toward L; facilitation through head and shoulders and right knee/hips. Pt able to maintain right sidelying.   Transfers Overall transfer level: Needs assistance               General transfer comment: Maxisky used to lift to chair.    Balance Overall balance assessment: Needs assistance   Sitting balance-Leahy Scale: Poor Sitting balance - Comments: leaning R in bed                                   ADL either performed or assessed with clinical judgement   ADL Overall ADL's : Needs assistance/impaired                                       General ADL Comments: Total A at this time     Vision    Additional Comments: eyes closed majorityof session   Perception     Praxis      Cognition Arousal/Alertness: Lethargic Behavior During Therapy: Flat affect Overall Cognitive Status: Difficult to assess                                          Exercises Other Exercises Other Exercises: RUE PROM   Shoulder Instructions       General Comments      Pertinent Vitals/ Pain       Pain Assessment: Faces Faces Pain Scale: Hurts a little bit Pain Location: with noxious stimuli Pain Descriptors / Indicators: Grimacing Pain Intervention(s): Limited activity within patient's tolerance  Home Living                                          Prior Functioning/Environment              Frequency  Min 2X/week        Progress Toward Goals  OT Goals(current goals can now be found in the care plan  section)  Progress towards OT goals: Not progressing toward goals - comment(lethargic this am)  Acute Rehab OT Goals Patient Stated Goal: per wife - to get better and be able to go home OT Goal Formulation: With patient/family Time For Goal Achievement: 04/06/17 Potential to Achieve Goals: Good ADL Goals Pt Will Perform Grooming: with min assist Pt Will Perform Upper Body Bathing: with min assist Pt Will Perform Lower Body Bathing: with mod assist;bed level Pt/caregiver will Perform Home Exercise Program: Right Upper extremity;With written HEP provided Additional ADL Goal #1: Pt will consistently follow 75% of commands with min  A in nondistracting environment  Plan Discharge plan remains appropriate    Co-evaluation                 AM-PAC PT "6 Clicks" Daily Activity     Outcome Measure   Help from another person eating meals?: Total Help from another person taking care of personal grooming?: Total Help from another person toileting, which includes using toliet, bedpan, or urinal?: Total Help from another person bathing  (including washing, rinsing, drying)?: Total Help from another person to put on and taking off regular upper body clothing?: Total Help from another person to put on and taking off regular lower body clothing?: Total 6 Click Score: 6    End of Session Equipment Utilized During Treatment: Other (comment)(vent)  OT Visit Diagnosis: Other abnormalities of gait and mobility (R26.89);Muscle weakness (generalized) (M62.81);Low vision, both eyes (H54.2);Other symptoms and signs involving cognitive function;Hemiplegia and hemiparesis Hemiplegia - Right/Left: Right Hemiplegia - dominant/non-dominant: Dominant Hemiplegia - caused by: Unspecified   Activity Tolerance Patient tolerated treatment well   Patient Left in chair;with call bell/phone within reach;with nursing/sitter in room;with restraints reapplied   Nurse Communication Need for lift equipment;Mobility status        Time: [id number] OT Time Calculation (min): 43 min  Charges: OT General Charges $OT Visit: 1 Visit OT Treatments $Therapeutic Activity: 38-52 mins  [facility] Shawna Jordan, OT/L  [phone] 03/24/2017   Timothy Jordan,Timothy 03/24/2017, 1:27 PM

## 2017-03-25 ENCOUNTER — Inpatient Hospital Stay
Admission: RE | Admit: 2017-03-25 | Discharge: 2017-04-10 | Disposition: E | Source: Ambulatory Visit | Attending: Internal Medicine | Admitting: Internal Medicine

## 2017-03-25 ENCOUNTER — Encounter (HOSPITAL_BASED_OUTPATIENT_CLINIC_OR_DEPARTMENT_OTHER)

## 2017-03-25 ENCOUNTER — Other Ambulatory Visit (HOSPITAL_COMMUNITY)

## 2017-03-25 DIAGNOSIS — R609 Edema, unspecified: Secondary | ICD-10-CM

## 2017-03-25 DIAGNOSIS — R0902 Hypoxemia: Secondary | ICD-10-CM

## 2017-03-25 DIAGNOSIS — Z931 Gastrostomy status: Secondary | ICD-10-CM

## 2017-03-25 DIAGNOSIS — Z4659 Encounter for fitting and adjustment of other gastrointestinal appliance and device: Secondary | ICD-10-CM

## 2017-03-25 DIAGNOSIS — Z431 Encounter for attention to gastrostomy: Secondary | ICD-10-CM

## 2017-03-25 DIAGNOSIS — J9690 Respiratory failure, unspecified, unspecified whether with hypoxia or hypercapnia: Secondary | ICD-10-CM

## 2017-03-25 LAB — BLOOD GAS, ARTERIAL
Acid-Base Excess: 1.9 mmol/L (ref 0.0–2.0)
Bicarbonate: 25.1 mmol/L (ref 20.0–28.0)
FIO2: 40
O2 SAT: 95.1 %
PCO2 ART: 33.7 mmHg (ref 32.0–48.0)
PEEP: 5 cmH2O
Patient temperature: 98.6
Pressure support: 10 cmH2O
pH, Arterial: 7.485 — ABNORMAL HIGH (ref 7.350–7.450)
pO2, Arterial: 77 mmHg — ABNORMAL LOW (ref 83.0–108.0)

## 2017-03-25 LAB — BASIC METABOLIC PANEL
ANION GAP: 10 (ref 5–15)
BUN: 51 mg/dL — AB (ref 6–20)
CALCIUM: 8.6 mg/dL — AB (ref 8.9–10.3)
CO2: 20 mmol/L — AB (ref 22–32)
CREATININE: 1.81 mg/dL — AB (ref 0.61–1.24)
Chloride: 115 mmol/L — ABNORMAL HIGH (ref 101–111)
GFR calc Af Amer: 45 mL/min — ABNORMAL LOW (ref >60)
GFR calc non Af Amer: 39 mL/min — ABNORMAL LOW (ref >60)
GLUCOSE: 170 mg/dL — AB (ref 65–99)
Potassium: 5 mmol/L (ref 3.5–5.1)
Sodium: 145 mmol/L (ref 135–145)

## 2017-03-25 LAB — PROTIME-INR
INR: 1.15
PROTHROMBIN TIME: 14.6 s (ref 11.4–15.2)

## 2017-03-25 LAB — CBC
HEMATOCRIT: 49 % (ref 39.0–52.0)
HEMOGLOBIN: 15.9 g/dL (ref 13.0–17.0)
MCH: 29.7 pg (ref 26.0–34.0)
MCHC: 32.4 g/dL (ref 30.0–36.0)
MCV: 91.4 fL (ref 78.0–100.0)
Platelets: 284 10*3/uL (ref 150–400)
RBC: 5.36 MIL/uL (ref 4.22–5.81)
RDW: 15.1 % (ref 11.5–15.5)
WBC: 16.8 10*3/uL — ABNORMAL HIGH (ref 4.0–10.5)

## 2017-03-25 NOTE — Progress Notes (Signed)
[facility] without complications.  Placed on [facility] vent with patient's current settings.

## 2017-03-25 NOTE — Progress Notes (Signed)
VASCULAR LAB PRELIMINARY  PRELIMINARY  PRELIMINARY  PRELIMINARY  Right upper extremity venous duplex completed.    Preliminary report:  There is acute, non occlusive DVT noted in the axillary vein in the axilla and chest.  There is superficial thrombosis noted in the right cephalic and basilic veins.   Gave report to Dr. Everlena CooperBrown  Timothy Jordan, [facility] LLCCANDACE, RVT 03/11/2017, 2:04 PM

## 2017-03-26 LAB — CBC WITH DIFFERENTIAL/PLATELET
BASOS PCT: 0 %
Basophils Absolute: 0 10*3/uL (ref 0.0–0.1)
Eosinophils Absolute: 0 10*3/uL (ref 0.0–0.7)
Eosinophils Relative: 0 %
HEMATOCRIT: 46.9 % (ref 39.0–52.0)
HEMOGLOBIN: 15 g/dL (ref 13.0–17.0)
Lymphocytes Relative: 7 %
Lymphs Abs: 1 10*3/uL (ref 0.7–4.0)
MCH: 28.8 pg (ref 26.0–34.0)
MCHC: 32 g/dL (ref 30.0–36.0)
MCV: 90 fL (ref 78.0–100.0)
MONOS PCT: 5 %
Monocytes Absolute: 0.7 10*3/uL (ref 0.1–1.0)
NEUTROS ABS: 12.7 10*3/uL — AB (ref 1.7–7.7)
NEUTROS PCT: 88 %
Platelets: 242 10*3/uL (ref 150–400)
RBC: 5.21 MIL/uL (ref 4.22–5.81)
RDW: 14.7 % (ref 11.5–15.5)
WBC: 14.3 10*3/uL — ABNORMAL HIGH (ref 4.0–10.5)

## 2017-03-26 LAB — BASIC METABOLIC PANEL
ANION GAP: 5 (ref 5–15)
BUN: 44 mg/dL — ABNORMAL HIGH (ref 6–20)
CALCIUM: 8.5 mg/dL — AB (ref 8.9–10.3)
CHLORIDE: 113 mmol/L — AB (ref 101–111)
CO2: 25 mmol/L (ref 22–32)
Creatinine, Ser: 1.67 mg/dL — ABNORMAL HIGH (ref 0.61–1.24)
GFR calc non Af Amer: 43 mL/min — ABNORMAL LOW (ref >60)
GFR, EST AFRICAN AMERICAN: 50 mL/min — AB (ref >60)
Glucose, Bld: 257 mg/dL — ABNORMAL HIGH (ref 65–99)
POTASSIUM: 4.8 mmol/L (ref 3.5–5.1)
Sodium: 143 mmol/L (ref 135–145)

## 2017-03-28 ENCOUNTER — Other Ambulatory Visit (HOSPITAL_COMMUNITY)

## 2017-03-28 MED ORDER — IOPAMIDOL (ISOVUE-300) INJECTION 61%
INTRAVENOUS | Status: AC
Start: 2017-03-28 — End: 2017-03-28
  Administered 2017-03-28: 50 mL via GASTROSTOMY
  Filled 2017-03-28: qty 50

## 2017-03-29 LAB — BASIC METABOLIC PANEL
Anion gap: 7 (ref 5–15)
BUN: 47 mg/dL — ABNORMAL HIGH (ref 6–20)
CHLORIDE: 110 mmol/L (ref 101–111)
CO2: 27 mmol/L (ref 22–32)
CREATININE: 1.67 mg/dL — AB (ref 0.61–1.24)
Calcium: 9 mg/dL (ref 8.9–10.3)
GFR calc non Af Amer: 43 mL/min — ABNORMAL LOW (ref >60)
GFR, EST AFRICAN AMERICAN: 50 mL/min — AB (ref >60)
Glucose, Bld: 380 mg/dL — ABNORMAL HIGH (ref 65–99)
POTASSIUM: 5.1 mmol/L (ref 3.5–5.1)
SODIUM: 144 mmol/L (ref 135–145)

## 2017-03-30 ENCOUNTER — Other Ambulatory Visit (HOSPITAL_COMMUNITY)

## 2017-03-31 ENCOUNTER — Encounter: Payer: Self-pay | Admitting: Physician Assistant

## 2017-03-31 ENCOUNTER — Other Ambulatory Visit (HOSPITAL_COMMUNITY)

## 2017-03-31 LAB — CBC WITH DIFFERENTIAL/PLATELET
Basophils Absolute: 0 10*3/uL (ref 0.0–0.1)
Basophils Relative: 0 %
EOS ABS: 0 10*3/uL (ref 0.0–0.7)
Eosinophils Relative: 0 %
HCT: 48.9 % (ref 39.0–52.0)
HEMOGLOBIN: 16 g/dL (ref 13.0–17.0)
LYMPHS ABS: 1.1 10*3/uL (ref 0.7–4.0)
Lymphocytes Relative: 5 %
MCH: 30.1 pg (ref 26.0–34.0)
MCHC: 32.7 g/dL (ref 30.0–36.0)
MCV: 91.9 fL (ref 78.0–100.0)
Monocytes Absolute: 0.6 10*3/uL (ref 0.1–1.0)
Monocytes Relative: 3 %
Neutro Abs: 19.3 10*3/uL — ABNORMAL HIGH (ref 1.7–7.7)
Neutrophils Relative %: 92 %
PLATELETS: 233 10*3/uL (ref 150–400)
RBC: 5.32 MIL/uL (ref 4.22–5.81)
RDW: 14.9 % (ref 11.5–15.5)
WBC: 21 10*3/uL — ABNORMAL HIGH (ref 4.0–10.5)

## 2017-03-31 LAB — GRAM STAIN

## 2017-03-31 MED ORDER — CEFAZOLIN SODIUM-DEXTROSE 1-4 GM/50ML-% IV SOLN: 1.0000 g | Freq: Once | INTRAVENOUS | Status: DC

## 2017-03-31 NOTE — H&P (Signed)
Chief Complaint: Vent dependence Need for enteral nutrition  Referring Physician(s): [name]  Supervising Physician: Jacqulynn Cadet  Patient Status: [name] - In-pt  History of Present Illness: Timothy Jordan is a 61 y.o. male w/ hx of DM and HTN and dental extraction 30 days prior who was admitted with a brain abscess, and then developed encephalopathy requiring intubation.   He underwent percutaneous tracheostomy 11/5.   Early 11/6 he had an episode of NSVT. He was started on amiodarone and has maintained NSR since.  He was unable to be weaned from the vent and he was transferred to [facility].  He is known to our service. Dr. Barbie Banner placed central venous access for antibiotics.  We are asked to evaluate for gastrostomy tube placement.  Past Medical History:  Diagnosis Date  . Diabetes mellitus without complication [facility])     Past Surgical History:  Procedure Laterality Date  . ACHILLES TENDON REPAIR    . HERNIA REPAIR    . IR FLUORO GUIDE CV LINE RIGHT  03/11/2017  . IR US GUIDE VASC ACCESS RIGHT  03/11/2017  . PR DURAL GRAFT REPAIR,SPINE DEFECT Left 03/08/2017   Procedure: FRAMLESS STERIOTACTIC BIOPSY WITH STEALTH;  Surgeon: Newman Pies, MD;  Location: [place];  Service: Neurosurgery;  Laterality: Left;    Allergies: Patient has no known allergies.  Medications: Prior to Admission medications   Medication Sig Start Date End Date Taking? Authorizing Provider  Amino Acids-Protein Hydrolys (FEEDING SUPPLEMENT, PRO-STAT SUGAR FREE 64,) LIQD Place 60 mLs 3 (three) times daily into feeding tube. 03/24/17   Cherene Altes, MD  aspirin 81 MG chewable tablet Place 1 tablet (81 mg total) daily into feeding tube. 04/06/2017   Cherene Altes, MD  bisacodyl (DULCOLAX) 10 MG suppository Place 1 suppository (10 mg total) daily as needed rectally for moderate constipation. 03/24/17   Cherene Altes, MD  Chlorhexidine Gluconate Cloth 2 % PADS Apply 6 each daily  topically. 04/03/2017   Cherene Altes, MD  chlorhexidine gluconate, MEDLINE KIT, (PERIDEX) 0.12 % solution 15 mLs 2 (two) times daily by Mouth Rinse route. 03/24/17   Cherene Altes, MD  dexamethasone (DECADRON) 4 MG/ML injection Inject 0.5 mLs (2 mg total) every 12 (twelve) hours into the vein. 03/24/17   Cherene Altes, MD  docusate (COLACE) 50 MG/5ML liquid Place 10 mLs (100 mg total) 2 (two) times daily as needed into feeding tube for mild constipation. 03/24/17   Cherene Altes, MD  enoxaparin (LOVENOX) 40 MG/0.4ML injection Inject 0.4 mLs (40 mg total) daily into the skin. 04/06/2017   Cherene Altes, MD  hydrALAZINE (APRESOLINE) 20 MG/ML injection Inject 0.5 mLs (10 mg total) every 4 (four) hours as needed into the vein (systolic greater than 546 mmHg). 03/24/17   Cherene Altes, MD  hydrocortisone (ANUSOL-HC) 25 MG suppository Place 1 suppository (25 mg total) 2 (two) times daily rectally. 03/24/17   Cherene Altes, MD  insulin aspart (NOVOLOG) 100 UNIT/ML injection Inject 0-20 Units every 4 (four) hours into the skin. 03/24/17   Cherene Altes, MD  insulin glargine (LANTUS) 100 UNIT/ML injection Inject 0.8 mLs (80 Units total) daily into the skin. 04/05/2017   Cherene Altes, MD  levETIRAcetam 750 mg in sodium chloride 0.9 % 100 mL Inject 750 mg every 12 (twelve) hours into the vein. 03/24/17   Cherene Altes, MD  meropenem 2 g in sodium chloride 0.9 % 100 mL Inject 2 g every 8 (eight) hours  into the vein. 03/24/17   Cherene Altes, MD  metoprolol tartrate (LOPRESSOR) 25 mg/10 mL SUSP Place 10 mLs (25 mg total) 2 (two) times daily into feeding tube. 03/24/17   Cherene Altes, MD  metoprolol tartrate (LOPRESSOR) 5 MG/5ML SOLN injection Inject 5 mLs (5 mg total) every 6 (six) hours as needed into the vein (SBP > 180). 03/24/17   Cherene Altes, MD  mupirocin cream (BACTROBAN) 2 % Apply 2 (two) times daily topically. 03/24/17   Cherene Altes, MD    Nutritional Supplements (FEEDING SUPPLEMENT, GLUCERNA 1.2 CAL,) LIQD Place 1,000 mLs continuous into feeding tube. 03/24/17   Cherene Altes, MD  pantoprazole sodium (PROTONIX) 40 mg/20 mL PACK Place 20 mLs (40 mg total) daily into feeding tube. 04/09/2017   Cherene Altes, MD  sennosides (SENOKOT) 8.8 MG/5ML syrup Place 5 mLs at bedtime into feeding tube. 03/24/17   Cherene Altes, MD  sodium chloride 0.9 % infusion Inject 10 mLs continuous into the vein. 03/24/17   Cherene Altes, MD  sodium chloride flush (NS) 0.9 % SOLN 10-40 mLs every 12 (twelve) hours by Intracatheter route. 03/24/17   Cherene Altes, MD  sodium chloride flush (NS) 0.9 % SOLN 10-40 mLs as needed by Intracatheter route (flush). 03/24/17   Cherene Altes, MD  Water For Irrigation, Sterile (FREE WATER) SOLN Place 400 mLs every 4 (four) hours into feeding tube. 03/24/17   Cherene Altes, MD     History reviewed. No pertinent family history.  Social History   Socioeconomic History  . Marital status: Married    Spouse name: None  . Number of children: None  . Years of education: None  . Highest education level: None  Social Needs  . Financial resource strain: None  . Food insecurity - worry: None  . Food insecurity - inability: None  . Transportation needs - medical: None  . Transportation needs - non-medical: None  Occupational History  . None  Tobacco Use  . Smoking status: Former Research scientist (life sciences)  . Smokeless tobacco: Never Used  Substance and Sexual Activity  . Alcohol use: No  . Drug use: None  . Sexual activity: None  Other Topics Concern  . None  Social History Narrative  . None    Review of Systems  Unable to perform ROS: Patient unresponsive    Vital Signs: There were no vitals taken for this visit.  Physical Exam  Constitutional: He appears well-developed.  HENT:  Head: Atraumatic.  Cardiovascular: Normal rate and regular rhythm.  Pulmonary/Chest: He has no wheezes.  On  Ventilator  Abdominal: Soft.    Large hematoma. No evidence of infection  Neurological:  Non-verbal. Does not open eyes to voice but does fold arms and bend knee and pushes my hand away during my exam.    Imaging: Ct Abdomen Wo Contrast  Result Date: 03/31/2017 CLINICAL DATA:  Gastrostomy tube evaluation. EXAM: CT ABDOMEN WITHOUT CONTRAST TECHNIQUE: Multidetector CT imaging of the abdomen elvis was performed following the standard protocol without IV contrast. COMPARISON:  None. FINDINGS: Lower chest: Subsegmental atelectasis at the left base. Patchy consolidation at the right lung base posteriorly. Cardiomegaly. Hepatobiliary: High-density material is layering in the gallbladder likely due to tiny gallstones. No wall thickening. Liver is unremarkable in appearance. Pancreas: Unremarkable Spleen: Remarkable Adrenals/Urinary Tract: There are benign appearing cysts in the kidneys. There is a 10 mm exophytic posterior space-occupying lesion within the right kidney on image 42. This may represent a  hyperdense cyst, but a small enhancing mass cannot be excluded. Adrenal glands are within normal limits. Stomach/Bowel: NG tube is present with its tip in the fundus of the stomach. Duodenal C-loop is unremarkable. The body of the stomach is positioned between the transverse colon and left lobe of the liver. Anatomy is favorable for gastrostomy tube placement. No evidence of small-bowel obstruction. Normal appendix. Vascular/Lymphatic: Atherosclerotic calcifications of the aorta and iliac arteries. No obvious abnormal retroperitoneal adenopathy. Other: No free-fluid. There is extensive stranding within the subcutaneous fat of the anterior abdomen primarily to the left of midline. No free intraperitoneal gas. No free-fluid in the abdomen. Musculoskeletal: Right gluteus muscle lipoma. L5-S1 degenerative disc disease. midthoracic level compression deformities have a chronic appearance. IMPRESSION: There is favorable  anatomy for gastrostomy tube placement, however there is significant stranding in the subcutaneous fat of the anterior abdomen. This may represent hematoma or cellulitis. Correlate clinically as this may interfere with gastrostomy tube placement. 10 mm hyperdense focus in the right kidney. Tiny enhancing mass cannot be excluded. MRI with and without contrast is recommended. Right basilar consolidation. Cardiomegaly. NG tube in the stomach. Probable cholelithiasis.  Ultrasound is recommended. There is atherosclerotic calcification of the non aneurysmal abdominal aorta. Electronically Signed   By: Marybelle Killings M.D.   On: 03/31/2017 07:32   Dg Chest 1 View  Result Date: 03/07/2017 CLINICAL DATA:  S/p intubation EXAM: CHEST  1 VIEW COMPARISON:  Earlier exam of same day FINDINGS: Endotracheal tube is been placed. Tip approximately 6 cm above carina. Low lung volumes. Perihilar interstitial opacities persist. Some increase in infrahilar airspace opacities since earlier study. Heart size and mediastinal contours are within normal limits. Blunting of lateral costophrenic angles possible small effusions. No pneumothorax. Visualized bones unremarkable. IMPRESSION: 1. Endotracheal tube tip 6 cm above the above carina. 2. Slight progression of perihilar and infrahilar atelectasis/edema/infiltrates. Electronically Signed   By: Lucrezia Europe M.D.   On: 03/07/2017 10:21   Ct Head Wo Contrast  Result Date: 03/15/2017 CLINICAL DATA:  Cerebral hemorrhage suspected. History per electronic medical record is brain abscess on antibiotics. Repeat head CT to assess response antibiotics. EXAM: CT HEAD WITHOUT CONTRAST TECHNIQUE: Contiguous axial images were obtained from the base of the skull through the vertex without intravenous contrast. COMPARISON:  03/07/2017 FINDINGS: Brain: Low-density spaning the posterior putamen, posterior corona radiata, and caudate body. The posterior and inferior aspect of this low-density is likely the  abscess measured previously. Measurement today is not possible without contrast. More superiorly could be vasogenic edema or a perforator infarct. There is still 3 mm of midline shift, but swelling is improved and there is less effacement of the left lateral ventricle. There is low-density throughout the bilateral cerebral white matter that is chronic based on MRI. No hemorrhage or hydrocephalus. No gross ventricular debris. Vascular: No hyperdense vessel. Skull: Burr hole for aspiration. Sinuses/Orbits: Patchy opacification of posterior sinuses in this intubated patient. IMPRESSION: Known cerebral abscesses that are not well-defined without contrast. Swelling/mass effect is improved but there is a more well-defined wedge of low-density extending superiorly from the level of abscess, suspect interval perforator infarct. No hydrocephalus. Electronically Signed   By: Monte Fantasia M.D.   On: 03/15/2017 13:11   Ct Head Wo Contrast  Result Date: 03/07/2017 CLINICAL DATA:  Brain abscesses.  Mental status change. EXAM: CT HEAD WITHOUT CONTRAST TECHNIQUE: Contiguous axial images were obtained from the base of the skull through the vertex without intravenous contrast. COMPARISON:  Head CT and  MRI 03/05/2017 FINDINGS: Brain: Low-density masses in the left basal ganglia region appears stable to slightly larger compared to the prior CT, measuring 2.2 x 1.8 cm and 2.5 x 1.6 cm. Surrounding edema has increased, and there is now 5 mm of rightward midline shift with progressive effacement of the left lateral ventricle. There is no evidence of acute large territory infarct, intracranial hemorrhage, or extra-axial fluid collection. Cerebral volume is within normal limits for age. Confluent hypodensity elsewhere in the cerebral white matter bilaterally is unchanged and nonspecific but may reflect chronic small vessel ischemic disease, greatly advanced for age. Vascular: No hyperdense vessel. Skull: No fracture or focal osseous  lesion. Sinuses/Orbits: Right maxillary sinus mucous retention cyst. Bilateral proptosis. Other: Partially visualized endotracheal tube. IMPRESSION: Stable to slight enlargement of left basal ganglia masses compatible with abscesses on MRI. Worsening surrounding edema with 5 mm of rightward midline shift. Electronically Signed   By: Logan Bores M.D.   On: 03/07/2017 12:25   Mr Jeri Cos And Wo Contrast  Result Date: 03/05/2017 CLINICAL DATA:  Slurred speech. Acute presentation with right-sided weakness. Slurred speech. Abnormal behavior. History of diabetes. EXAM: MRI HEAD WITHOUT AND WITH CONTRAST TECHNIQUE: Multiplanar, multiecho pulse sequences of the brain and surrounding structures were obtained without and with intravenous contrast. CONTRAST:  56m MULTIHANCE GADOBENATE DIMEGLUMINE 529 MG/ML IV SOLN COMPARISON:  CT earlier same day FINDINGS: Brain: Background pattern of chronic small vessel ischemic change throughout the pons in the cerebral hemispheric white matter. Two ring-enhancing lesions in the left basal ganglia, adjacent and probably in communication. Together these measure about 4.5 cm in length with a transverse diameter of each component about 1.8 x 2 cm. Central nonenhancing material showing pronounced restricted diffusion. Surrounding vasogenic edema. Findings are most consistent with deep brain abscesses. No other abnormal enhancement. Mild mass effect with 1 or 2 mm of left-to-right shift. No hydrocephalus. No extra-axial collection. Vascular: Major vessels at the base of the brain show flow. Skull and upper cervical spine: Negative Sinuses/Orbits: Clear sinuses except for insignificant retention cyst. Orbits negative. Other: None IMPRESSION: Two adjacent in probably communicating lesions in the left basal ganglia most consistent with brain abscess. In total, these measure approximately 4.5 x 1.8 x 2.0 cm. Electronically Signed   By: MNelson ChimesM.D.   On: 03/05/2017 07:50   [place] Renal  Result Date: 03/08/2017 CLINICAL DATA:  Acute renal failure x1 day.  Patient is ventilated. EXAM: RENAL / URINARY TRACT ULTRASOUND COMPLETE COMPARISON:  None. FINDINGS: Right Kidney: Length: 13.4 cm. Echogenicity within normal limits. No mass or hydronephrosis visualized. No obstructive uropathy. Trace perinephric fluid. Left Kidney: Length: 13 cm. Echogenicity within normal limits. Hypoechoic interpolar 2.2 x 1.3 by 1.9 cm and lower pole 2.9 x 2.1 x 2.3 cm cyst likely complex with internal echoes are noted. No obstructive uropathy. Trace perinephric fluid. Bladder: Decompressed by Foley. IMPRESSION: 1. Complex left-sided renal cysts. 2. No obstructive uropathy or loss of cortical-medullary distinction to explain the patient's acute renal failure. 3. Trace perinephric fluid is seen about both kidneys. Electronically Signed   By: DAshley RoyaltyM.D.   On: 03/08/2017 22:51   Ir Fluoro Guide Cv Line Right  Result Date: 03/11/2017 INDICATION: Stroke EXAM: RIGHT JUGULAR TUNNELED PICC LINE PLACEMENT WITH ULTRASOUND AND FLUOROSCOPIC GUIDANCE MEDICATIONS: None ANESTHESIA/SEDATION: None FLUOROSCOPY TIME:  Fluoroscopy Time:  minutes 12 seconds (2 mGy). COMPLICATIONS: None immediate. PROCEDURE: The patient was advised of the possible risks and complications and agreed to undergo the procedure.  The patient was then brought to the angiographic suite for the procedure. The right neck was prepped with chlorhexidine, draped in the usual sterile fashion using maximum barrier technique (cap and mask, sterile gown, sterile gloves, large sterile sheet, hand hygiene and cutaneous antiseptic). Local anesthesia was attained by infiltration with 1% lidocaine. Ultrasound demonstrated patency of the right jugular vein, and this was documented with an image. Under real-time ultrasound guidance, this vein was accessed with a 21 gauge micropuncture needle and image documentation was performed. The needle was exchanged over a  guidewire for a peel-away sheath through which a 24 cm 5 [place] double lumen power injectable PICC was advanced, and positioned with its tip at the lower SVC/right atrial junction. The cuff was positioned in the subcutaneous tract. Fluoroscopy during the procedure and fluoro spot radiograph confirms appropriate catheter position. The catheter was flushed, secured to the skin with Prolene sutures, and covered with a sterile dressing. IMPRESSION: Successful placement of a tunneled right jugular PICC with sonographic and fluoroscopic guidance. The catheter is ready for use. Electronically Signed   By: Marybelle Killings M.D.   On: 03/11/2017 15:11   Ir US Guide Vasc Access Right  Result Date: 03/11/2017 INDICATION: Stroke EXAM: RIGHT JUGULAR TUNNELED PICC LINE PLACEMENT WITH ULTRASOUND AND FLUOROSCOPIC GUIDANCE MEDICATIONS: None ANESTHESIA/SEDATION: None FLUOROSCOPY TIME:  Fluoroscopy Time:  minutes 12 seconds (2 mGy). COMPLICATIONS: None immediate. PROCEDURE: The patient was advised of the possible risks and complications and agreed to undergo the procedure. The patient was then brought to the angiographic suite for the procedure. The right neck was prepped with chlorhexidine, draped in the usual sterile fashion using maximum barrier technique (cap and mask, sterile gown, sterile gloves, large sterile sheet, hand hygiene and cutaneous antiseptic). Local anesthesia was attained by infiltration with 1% lidocaine. Ultrasound demonstrated patency of the right jugular vein, and this was documented with an image. Under real-time ultrasound guidance, this vein was accessed with a 21 gauge micropuncture needle and image documentation was performed. The needle was exchanged over a guidewire for a peel-away sheath through which a 24 cm 5 [place] double lumen power injectable PICC was advanced, and positioned with its tip at the lower SVC/right atrial junction. The cuff was positioned in the subcutaneous tract. Fluoroscopy during  the procedure and fluoro spot radiograph confirms appropriate catheter position. The catheter was flushed, secured to the skin with Prolene sutures, and covered with a sterile dressing. IMPRESSION: Successful placement of a tunneled right jugular PICC with sonographic and fluoroscopic guidance. The catheter is ready for use. Electronically Signed   By: Marybelle Killings M.D.   On: 03/11/2017 15:11   Dg Abdomen Peg Tube Location  Result Date: 03/28/2017 CLINICAL DATA:  S/P percutaneous endoscopic gastrostomy (PEG) tube placement EXAM: ABDOMEN - 1 VIEW COMPARISON:  None. FINDINGS: Contrast is seen within the stomach and proximal small bowel confirming appropriate positioning of the gastrostomy tube. No extraluminal contrast. Visualized bowel gas pattern is nonobstructive IMPRESSION: Contrast within the stomach and proximal small bowel confirming appropriate positioning of the gastrostomy tube. No evidence of contrast leakage. Electronically Signed   By: Franki Cabot M.D.   On: 03/28/2017 18:53   Dg Chest Port 1 View  Result Date: 03/22/2017 CLINICAL DATA:  Brain abscess.  Worsening endotracheal secretions. EXAM: PORTABLE CHEST 1 VIEW COMPARISON:  03/15/2017 FINDINGS: Tracheostomy remains in place. Right sided PICC tip at the SVC RA junction. Patchy infiltrate persists in both lower lobes right worse than left, similar or slightly improved when  compared to the study of 1 week ago. Upper lungs remain clear. IMPRESSION: Persistent patchy lower lobe infiltrates right worse than left, probably slightly improved compared to the study of 1 week ago. Electronically Signed   By: Nelson Chimes M.D.   On: 03/22/2017 10:43   Dg Chest Port 1 View  Result Date: 03/15/2017 CLINICAL DATA:  Worsening breath sounds on the left EXAM: PORTABLE CHEST 1 VIEW COMPARISON:  Yesterday FINDINGS: Tracheostomy tube in place. Central line on the right with tip at the upper cavoatrial junction. Diffuse interstitial opacity without smooth  thin Kerley lines. There is superimposed streaky density at the bases. Lung volumes are low. Aeration has actually improved over the left diaphragm where there is less dense opacity and better visualized diaphragmatic contour. No significant effusion. No pneumothorax. IMPRESSION: 1. Atelectasis or pneumonia at the bases. Mild improvement on the left when compared yesterday - the diaphragm is better visualized. 2. Diffuse interstitial opacity not convincing for edema. 3. Apparent cardiomegaly accentuated by low volumes. Electronically Signed   By: Monte Fantasia M.D.   On: 03/15/2017 08:17   Dg Chest Port 1 View  Result Date: 03/14/2017 CLINICAL DATA:  Tracheostomy tube placed. EXAM: PORTABLE CHEST 1 VIEW COMPARISON:  Earlier today. FINDINGS: The endotracheal tube has been replaced by a tracheostomy tube in satisfactory position. The right jugular catheter tip is in the upper right atrium near the superior cavoatrial junction. Stable enlarged cardiac silhouette and diffusely prominent interstitial markings. Increased density of both lung bases. Thoracic spine degenerative changes. IMPRESSION: 1. Tracheostomy tube in satisfactory position. 2. Increased bibasilar atelectasis and possible pneumonia. 3. Stable interstitial lung disease. Electronically Signed   By: Claudie Revering M.D.   On: 03/14/2017 13:35   Dg Chest Port 1 View  Result Date: 03/14/2017 CLINICAL DATA:  61 year old male with brain abscesses. Respiratory failure. EXAM: PORTABLE CHEST 1 VIEW COMPARISON:  03/13/2017 and earlier. FINDINGS: Portable AP semi upright view at 0616 hours. Stable endotracheal tube. Enteric tube is looped at the level of the stomach. Stable right IJ central line. Borderline to mild cardiomegaly. Other mediastinal contours are within normal limits. No pneumothorax. Continued diffuse bilateral pulmonary interstitial opacity with mildly confluent opacity at both lung bases. However, basilar ventilation appears improved since  yesterday. Small if any pleural effusions. No areas of worsening ventilation. IMPRESSION: 1.  Stable lines and tubes. 2. Continued bilateral pulmonary interstitial opacity since 03/07/2017 which could be acute (atypical infection, interstitial edema) or chronic. 3. Interval improved lung base ventilation with patchy residual opacity. Small if any pleural effusions. Electronically Signed   By: Genevie Ann M.D.   On: 03/14/2017 08:13   Dg Chest Port 1 View  Result Date: 03/13/2017 CLINICAL DATA:  Initial evaluation for hypoxia. EXAM: PORTABLE CHEST 1 VIEW COMPARISON:  Prior radiograph from earlier the same day. FINDINGS: Patient is intubated with the tip of the endotracheal tube positioned 5.3 cm above the carina. Enteric tube courses in the abdomen right-sided central venous catheter is grossly similar terminating at the right atrium. Cardiomegaly grossly stable. Mediastinal silhouette grossly within normal limits allowing for patient positioning. Lungs hypoinflated. Diffuse interstitial prominence, most likely reflecting pulmonary interstitial edema, similar to previous. Elevation of the right hemidiaphragm with increased right basilar opacity, which may reflect progressive atelectasis or consolidation. Mild left basilar opacity may reflect atelectasis or infiltrate as well. No pneumothorax. Suspected small right pleural effusion. IMPRESSION: 1. Support apparatus in stable position as above. 2. Cardiomegaly with diffuse pulmonary interstitial edema, similar to  previous. 3. Hypoinflation with elevation of the right hemidiaphragm, with increased right basilar opacity, which may reflect progressive atelectasis or infiltrate. Electronically Signed   By: Jeannine Boga M.D.   On: 03/13/2017 21:45   Dg Chest Port 1 View  Result Date: 03/13/2017 CLINICAL DATA:  Acute respiratory failure EXAM: PORTABLE CHEST 1 VIEW COMPARISON:  March 12, 2017 FINDINGS: The distal tip of the right IJ is in the right atrium. The  ETT is in good position. The NG tube terminates below today's study. Stable cardiomegaly. Suggested mild pulmonary venous congestion without overt edema. No other interval changes. IMPRESSION: 1. The right central line terminates well into the right atrium. Recommend repositioning. Other support apparatus is stable. 2. Mild pulmonary venous congestion with no overt edema. Stable cardiomegaly. These results will be called to the ordering clinician or representative by the Radiologist Assistant, and communication documented in the PACS or zVision Dashboard. Electronically Signed   By: Dorise Bullion III M.D   On: 03/13/2017 08:08   Dg Chest Port 1 View  Result Date: 03/12/2017 CLINICAL DATA:  Pneumonia. EXAM: PORTABLE CHEST 1 VIEW COMPARISON:  Eleven scratched it 03/11/2017. FINDINGS: Interim placement of right IJ line, its tip is in the right atrium. Endotracheal tube the tube in stable position . Stable cardiomegaly. Pulmonary vascular prominence interstitial prominence, improved from prior exam. No pneumothorax . IMPRESSION: 1. Interim placement right IJ line, its tip is in the right atrium . Endotracheal tube and NG tube in stable position. 2. Cardiomegaly. Interim improvement pulmonary venous congestion and interstitial prominence suggesting improving congestive heart failure . Electronically Signed   By: Marcello Moores  Register   On: 03/12/2017 07:15   Dg Chest Port 1 View  Result Date: 03/11/2017 CLINICAL DATA:  Check endotracheal tube placement EXAM: PORTABLE CHEST 1 VIEW COMPARISON:  03/10/2017 FINDINGS: Endotracheal tube and nasogastric catheter are again noted and stable. Cardiac shadow is enlarged but stable. Bibasilar infiltrates with associated effusions are again seen and mildly increased when compare with the prior exam. No bony abnormality is noted. IMPRESSION: Tubes and lines as described. Increasing bibasilar changes right greater than left. Electronically Signed   By: Inez Catalina M.D.   On:  03/11/2017 09:02   Dg Chest Port 1 View  Result Date: 03/10/2017 CLINICAL DATA:  Acute respiratory failure, hypoxia EXAM: PORTABLE CHEST 1 VIEW COMPARISON:  03/08/2017 FINDINGS: Endotracheal tube and NG tube are unchanged. Cardiomegaly with vascular congestion. Worsening aeration and lung volumes with increasing bibasilar opacities. Question small layering effusions. IMPRESSION: Worsening aeration with decreasing lung volumes and increasing bibasilar atelectasis or infiltrates. Question small effusions. Electronically Signed   By: Rolm Baptise M.D.   On: 03/10/2017 08:27   Dg Chest Port 1 View  Result Date: 03/08/2017 CLINICAL DATA:  Hypoxia EXAM: PORTABLE CHEST 1 VIEW COMPARISON:  March 07, 2017 FINDINGS: Endotracheal tube tip is 4.6 cm above the carina. Nasogastric tube tip and side port are in stomach. There is bibasilar atelectasis, more on the right than the left a small right pleural effusion. There is cardiomegaly with pulmonary vascularity within normal limits. No adenopathy. No bone lesions. IMPRESSION: Tube positions as described without pneumothorax. Stable cardiomegaly. Small right pleural effusion with bibasilar atelectasis. No frank consolidation noted. Electronically Signed   By: Lowella Grip III M.D.   On: 03/08/2017 09:16   Dg Chest Port 1 View  Result Date: 03/07/2017 CLINICAL DATA:  Dyspnea. EXAM: PORTABLE CHEST 1 VIEW COMPARISON:  None. FINDINGS: The cardiac silhouette is enlarged. Lung volumes  are diminished with fine diffuse interstitial density bilaterally. No large pleural effusion or pneumothorax is identified. No acute osseous abnormality is seen. IMPRESSION: Cardiomegaly and low lung volumes with fine interstitial density bilaterally which may reflect viral/atypical infection or edema. Electronically Signed   By: Logan Bores M.D.   On: 03/07/2017 09:10   Dg Abd Portable 1v  Result Date: 03/14/2017 CLINICAL DATA:  Orogastric tube placement. EXAM: PORTABLE ABDOMEN  - 1 VIEW COMPARISON:  Abdominal radiograph performed 03/19/2017 FINDINGS: The patient's enteric tube is noted ending overlying the pylorus. The visualized bowel gas pattern is grossly unremarkable. No free intra-abdominal air is seen, though evaluation for free air is limited on a single supine view. No acute osseous abnormalities are identified. Mild right basilar airspace opacity is noted. IMPRESSION: Enteric tube noted ending overlying the pylorus. Electronically Signed   By: Garald Balding M.D.   On: 04/07/2017 02:35   Dg Abd Portable 1v  Result Date: 03/19/2017 CLINICAL DATA:  Obstipation. EXAM: PORTABLE ABDOMEN - 1 VIEW COMPARISON:  03/07/2017 FINDINGS: Enteric tube is present with tip over the right upper quadrant likely within the distal stomach or proximal duodenum. Bowel gas pattern is nonobstructive with mild fecal retention throughout the colon. No dilated small bowel loops. No free peritoneal air. Mild degenerate change of the spine. IMPRESSION: Nonobstructive bowel gas pattern. Enteric tube with tip in the right upper quadrant likely over the distal stomach or proximal duodenum. Electronically Signed   By: Marin Olp M.D.   On: 03/19/2017 07:28   Dg Abd Portable 1v  Result Date: 03/07/2017 CLINICAL DATA:  Encounter for orogastric (OG) tube placement EXAM: PORTABLE ABDOMEN - 1 VIEW COMPARISON:  None. FINDINGS: OG tube appears adequately positioned in the stomach with tip directed towards the stomach antrum/pylorus. Visualized bowel gas pattern is nonobstructive. IMPRESSION: OG tube appears adequately positioned in the stomach with tip directed towards the stomach antrum/pylorus region. Electronically Signed   By: Franki Cabot M.D.   On: 03/07/2017 12:40   Ct Head Code Stroke Wo Contrast  Result Date: 03/05/2017 CLINICAL DATA:  Code stroke.  RIGHT-sided weakness. EXAM: CT HEAD WITHOUT CONTRAST TECHNIQUE: Contiguous axial images were obtained from the base of the skull through the vertex  without intravenous contrast. COMPARISON:  None. FINDINGS: BRAIN: 2 cystic masses LEFT basal ganglia measuring 1.5 x 2.3 cm and 1.4 x 2.4 cm with surrounding low-density vasogenic edema. Confluent supratentorial white matter hypodensities. No acute large vascular territory infarct. No hydrocephalus. No midline shift. No abnormal extra-axial fluid collections. VASCULAR: Unremarkable. SKULL/SOFT TISSUES: No skull fracture. No significant soft tissue swelling. ORBITS/SINUSES: The included ocular globes and orbital contents are nonacute. Proptosis. Prominent bilateral superior ophthalmic veins. The mastoid aircells and included paranasal sinuses are well-aerated. OTHER: None. IMPRESSION: 1. Two cystic masses LEFT basal ganglia with surrounding vasogenic edema. Differential diagnosis includes infection/abscess, metastasis, less likely subacute hematomas. Recommend MRI of the brain with contrast. 2. Severe white matter changes, as seen with HIV/PML, chronic small vessel ischemic disease, prior chemo radiation. 3. Critical Value/emergent results were called by telephone at the time of interpretation on 03/05/2017 at 6:30 am to Dr. Rolland Porter , who verbally acknowledged these results. Electronically Signed   By: Elon Alas M.D.   On: 03/05/2017 06:34    Labs:  CBC: Recent Labs    03/23/17 0925 03/24/17 0500 03/21/2017 0900 03/26/17 0610  WBC 17.9* 17.6* 16.8* 14.3*  HGB 15.1 15.3 15.9 15.0  HCT 46.8 47.5 49.0 46.9  PLT 253 238  284 242    COAGS: Recent Labs    03/05/17 0815 03/14/17 0923 04/08/2017 0900  INR 0.99 1.38 1.15  APTT <20*  --   --     BMP: Recent Labs    03/24/17 0500 03/24/2017 0900 03/26/17 0610 03/29/17 0715  NA 146* 145 143 144  K 4.4 5.0 4.8 5.1  CL 115* 115* 113* 110  CO2 24 20* 25 27  GLUCOSE 179* 170* 257* 380*  BUN 55* 51* 44* 47*  CALCIUM 8.3* 8.6* 8.5* 9.0  CREATININE 1.67* 1.81* 1.67* 1.67*  GFRNONAA 43* 39* 43* 43*  GFRAA 50* 45* 50* 50*    LIVER  FUNCTION TESTS: Recent Labs    03/16/17 0334 03/19/17 0414 03/22/17 0500 03/23/17 0353  BILITOT 0.3 0.3 0.3 0.4  AST 30 49* 79* 55*  ALT 21 49 142* 132*  ALKPHOS 106 135* 181* 180*  PROT 6.6 6.1* 6.0* 5.7*  ALBUMIN 1.8* 1.7* 1.8* 1.8*    TUMOR MARKERS: No results for input(s): AFPTM, CEA, CA199, CHROMGRNA in the last 8760 hours.  Assessment and Plan:  Admitted and currently being treated for brain abscess.  Vent dependence and need for enteral nutrition.  CT scan shows good anatomy for G-tube.    Hematoma should not interfere with placement.  Will check CBC tomorrow and Friday and make sure it is trending down.  Risks and benefits discussed with the patient's son including, but not limited to the need for a barium enema during the procedure, bleeding, infection, peritonitis, or damage to adjacent structures. All of the patient's son's questions were answered, patient is agreeable to proceed. Consent signed and in IR.  Thank you for this interesting consult.  I greatly enjoyed meeting [employer] [place] and look forward to participating in their care.  A copy of this report was sent to the requesting provider on this date.  Electronically Signed: Murrell Redden, PA-C 03/31/2017, 9:05 AM   I spent a total of 40 Minutes in face to face in clinical consultation, greater than 50% of which was counseling/coordinating care for gastrostomy tube.

## 2017-04-01 ENCOUNTER — Other Ambulatory Visit (HOSPITAL_COMMUNITY)

## 2017-04-01 LAB — CK TOTAL AND CKMB (NOT AT ARMC)
CK TOTAL: 917 U/L — AB (ref 49–397)
CK, MB: 10.3 ng/mL — AB (ref 0.5–5.0)
Relative Index: 1.1 (ref 0.0–2.5)

## 2017-04-01 LAB — CBC
HCT: 39.2 % (ref 39.0–52.0)
HEMATOCRIT: 41.8 % (ref 39.0–52.0)
HEMATOCRIT: 34.8 % — AB (ref 39.0–52.0)
HEMOGLOBIN: 12.2 g/dL — AB (ref 13.0–17.0)
HEMOGLOBIN: 10.4 g/dL — AB (ref 13.0–17.0)
Hemoglobin: 13 g/dL (ref 13.0–17.0)
MCH: 29 pg (ref 26.0–34.0)
MCH: 28.8 pg (ref 26.0–34.0)
MCH: 29.1 pg (ref 26.0–34.0)
MCHC: 31.1 g/dL (ref 30.0–36.0)
MCHC: 31.1 g/dL (ref 30.0–36.0)
MCHC: 29.9 g/dL — ABNORMAL LOW (ref 30.0–36.0)
MCV: 93.1 fL (ref 78.0–100.0)
MCV: 92.7 fL (ref 78.0–100.0)
MCV: 97.2 fL (ref 78.0–100.0)
PLATELETS: 202 10*3/uL (ref 150–400)
Platelets: 190 10*3/uL (ref 150–400)
Platelets: 183 10*3/uL (ref 150–400)
RBC: 4.49 MIL/uL (ref 4.22–5.81)
RBC: 4.23 MIL/uL (ref 4.22–5.81)
RBC: 3.58 MIL/uL — AB (ref 4.22–5.81)
RDW: 14.6 % (ref 11.5–15.5)
RDW: 15 % (ref 11.5–15.5)
RDW: 15 % (ref 11.5–15.5)
WBC: 20.8 10*3/uL — AB (ref 4.0–10.5)
WBC: 19.8 10*3/uL — ABNORMAL HIGH (ref 4.0–10.5)
WBC: 19.3 10*3/uL — AB (ref 4.0–10.5)

## 2017-04-01 LAB — COMPREHENSIVE METABOLIC PANEL
ALK PHOS: 224 U/L — AB (ref 38–126)
ALT: >15000 U/L — ABNORMAL HIGH (ref 17–63)
ANION GAP: 24 — AB (ref 5–15)
Albumin: 1.4 g/dL — ABNORMAL LOW (ref 3.5–5.0)
BILIRUBIN TOTAL: 1.5 mg/dL — AB (ref 0.3–1.2)
BUN: 106 mg/dL — ABNORMAL HIGH (ref 6–20)
CALCIUM: 9.4 mg/dL (ref 8.9–10.3)
CO2: 19 mmol/L — ABNORMAL LOW (ref 22–32)
Chloride: 114 mmol/L — ABNORMAL HIGH (ref 101–111)
Creatinine, Ser: 4.88 mg/dL — ABNORMAL HIGH (ref 0.61–1.24)
GFR calc non Af Amer: 12 mL/min — ABNORMAL LOW (ref >60)
GFR, EST AFRICAN AMERICAN: 14 mL/min — AB (ref >60)
Glucose, Bld: 447 mg/dL — ABNORMAL HIGH (ref 65–99)
Potassium: 7.1 mmol/L (ref 3.5–5.1)
Sodium: 157 mmol/L — ABNORMAL HIGH (ref 135–145)
TOTAL PROTEIN: 4.1 g/dL — AB (ref 6.5–8.1)

## 2017-04-01 LAB — URINE CULTURE: Culture: NO GROWTH

## 2017-04-01 LAB — ABO/RH: ABO/RH(D): A POS

## 2017-04-01 LAB — BASIC METABOLIC PANEL
ANION GAP: 9 (ref 5–15)
BUN: 95 mg/dL — ABNORMAL HIGH (ref 6–20)
CO2: 22 mmol/L (ref 22–32)
Calcium: 8 mg/dL — ABNORMAL LOW (ref 8.9–10.3)
Chloride: 116 mmol/L — ABNORMAL HIGH (ref 101–111)
Creatinine, Ser: 2.91 mg/dL — ABNORMAL HIGH (ref 0.61–1.24)
GFR, EST AFRICAN AMERICAN: 26 mL/min — AB (ref >60)
GFR, EST NON AFRICAN AMERICAN: 22 mL/min — AB (ref >60)
Glucose, Bld: 519 mg/dL (ref 65–99)
POTASSIUM: 6 mmol/L — AB (ref 3.5–5.1)
SODIUM: 147 mmol/L — AB (ref 135–145)

## 2017-04-01 LAB — BLOOD GAS, ARTERIAL
ACID-BASE DEFICIT: 10.7 mmol/L — AB (ref 0.0–2.0)
BICARBONATE: 15.8 mmol/L — AB (ref 20.0–28.0)
FIO2: 100
O2 SAT: 96.8 %
Patient temperature: 98.6
pCO2 arterial: 41.8 mmHg (ref 32.0–48.0)
pH, Arterial: 7.201 — ABNORMAL LOW (ref 7.350–7.450)
pO2, Arterial: 135 mmHg — ABNORMAL HIGH (ref 83.0–108.0)

## 2017-04-01 LAB — HEPARIN LEVEL (UNFRACTIONATED): HEPARIN UNFRACTIONATED: 1.22 [IU]/mL — AB (ref 0.30–0.70)

## 2017-04-01 LAB — TYPE AND SCREEN
ABO/RH(D): A POS
Antibody Screen: NEGATIVE

## 2017-04-01 LAB — LACTIC ACID, PLASMA: Lactic Acid, Venous: 16.2 mmol/L (ref 0.5–1.9)

## 2017-04-01 LAB — PROCALCITONIN: PROCALCITONIN: 1.17 ng/mL

## 2017-04-01 LAB — HEMOGLOBIN AND HEMATOCRIT, BLOOD
HCT: 43.5 % (ref 39.0–52.0)
HCT: 41.8 % (ref 39.0–52.0)
HEMOGLOBIN: 13.8 g/dL (ref 13.0–17.0)
Hemoglobin: 13 g/dL (ref 13.0–17.0)

## 2017-04-01 LAB — PROTIME-INR
INR: 1.35
Prothrombin Time: 16.5 seconds — ABNORMAL HIGH (ref 11.4–15.2)

## 2017-04-01 LAB — APTT: APTT: 37 s — AB (ref 24–36)

## 2017-04-01 LAB — MAGNESIUM: Magnesium: 4.4 mg/dL — ABNORMAL HIGH (ref 1.7–2.4)

## 2017-04-02 LAB — CULTURE, RESPIRATORY

## 2017-04-02 LAB — CULTURE, RESPIRATORY W GRAM STAIN

## 2017-04-03 LAB — CULTURE, RESPIRATORY W GRAM STAIN

## 2017-04-03 LAB — CULTURE, RESPIRATORY

## 2017-04-03 LAB — HEPARIN INDUCED PLATELET AB (HIT ANTIBODY): HEPARIN INDUCED PLT AB: 0.179 {OD_unit} (ref 0.000–0.400)

## 2017-04-03 MED FILL — Medication: Qty: 1 | Status: AC

## 2017-04-05 LAB — CULTURE, BLOOD (ROUTINE X 2)
CULTURE: NO GROWTH
CULTURE: NO GROWTH
SPECIAL REQUESTS: ADEQUATE
SPECIAL REQUESTS: ADEQUATE

## 2017-04-10 DEATH — deceased

## 2018-02-13 IMAGING — DX DG ABD PORTABLE 1V
2 series · 2 of 2 positions shown · non-contrast
Comparison: 03/07/2017

CLINICAL DATA: Obstipation.

EXAM:
PORTABLE ABDOMEN - 1 VIEW

[abdomen kub (1 of 2)]
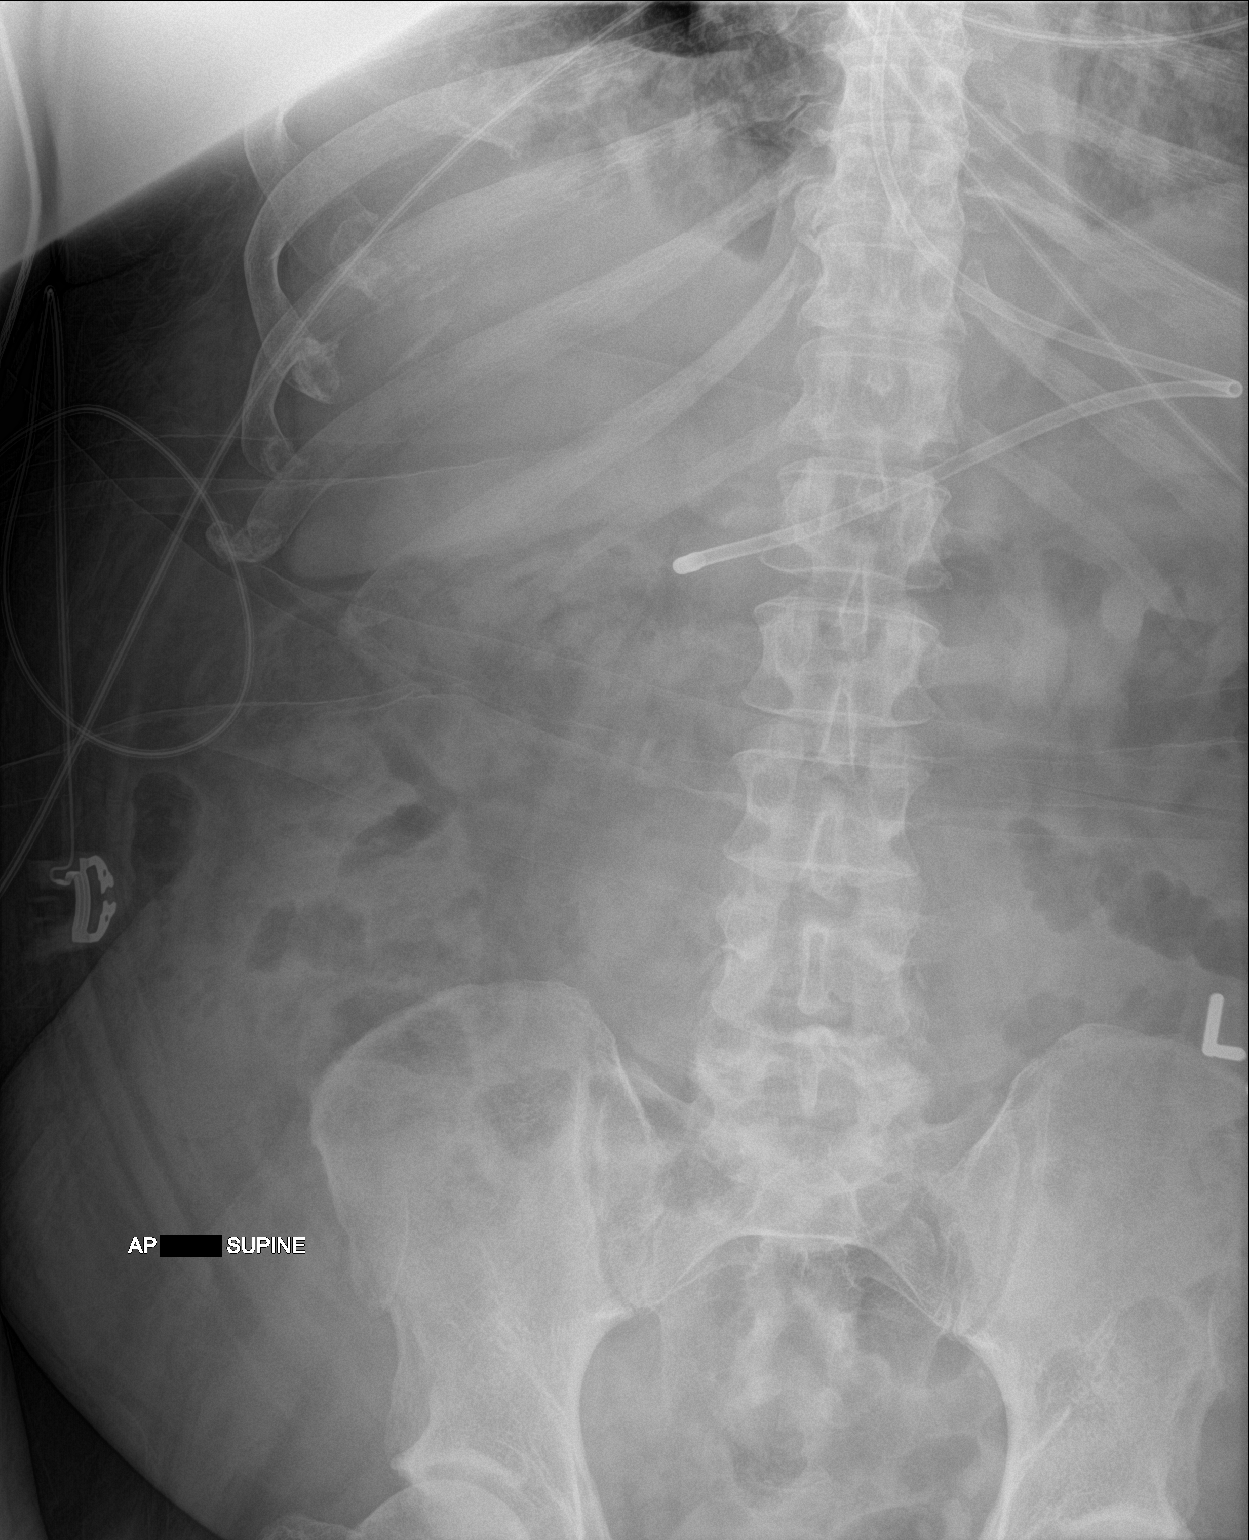

[abdomen kub (2 of 2)]
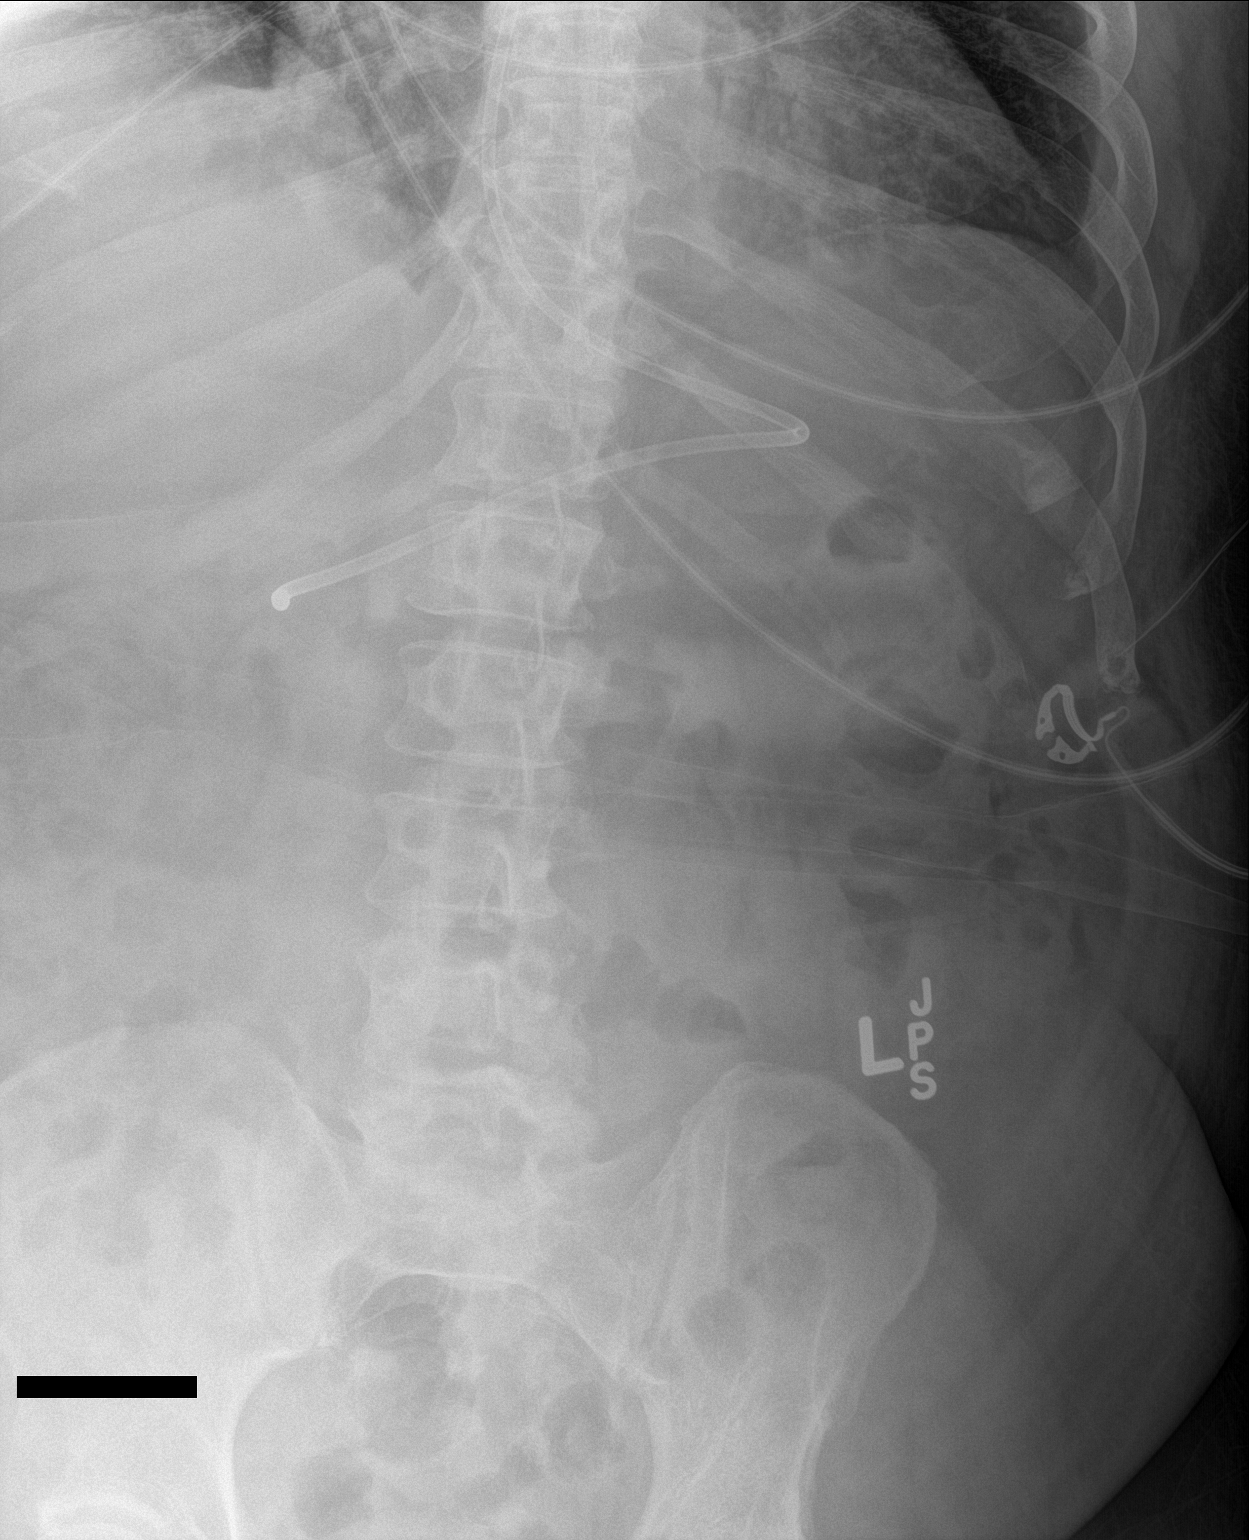

[2 of 2 positions shown; findings below may reference images not displayed]

FINDINGS: Enteric tube is present with tip over the right upper quadrant
likely within the distal stomach or proximal duodenum. Bowel gas
pattern is nonobstructive with mild fecal retention throughout the
colon. No dilated small bowel loops. No free peritoneal air. Mild
degenerate change of the spine.
IMPRESSION: Nonobstructive bowel gas pattern.

Enteric tube with tip in the right upper quadrant likely over the
distal stomach or proximal duodenum.

## 2018-02-22 IMAGING — DX DG ABDOMEN 1V
1 series · 1 of 1 positions shown · non-contrast
Comparison: None.

CLINICAL DATA: S/P percutaneous endoscopic gastrostomy (PEG) tube
placement

EXAM:
ABDOMEN - 1 VIEW

[abdomen]
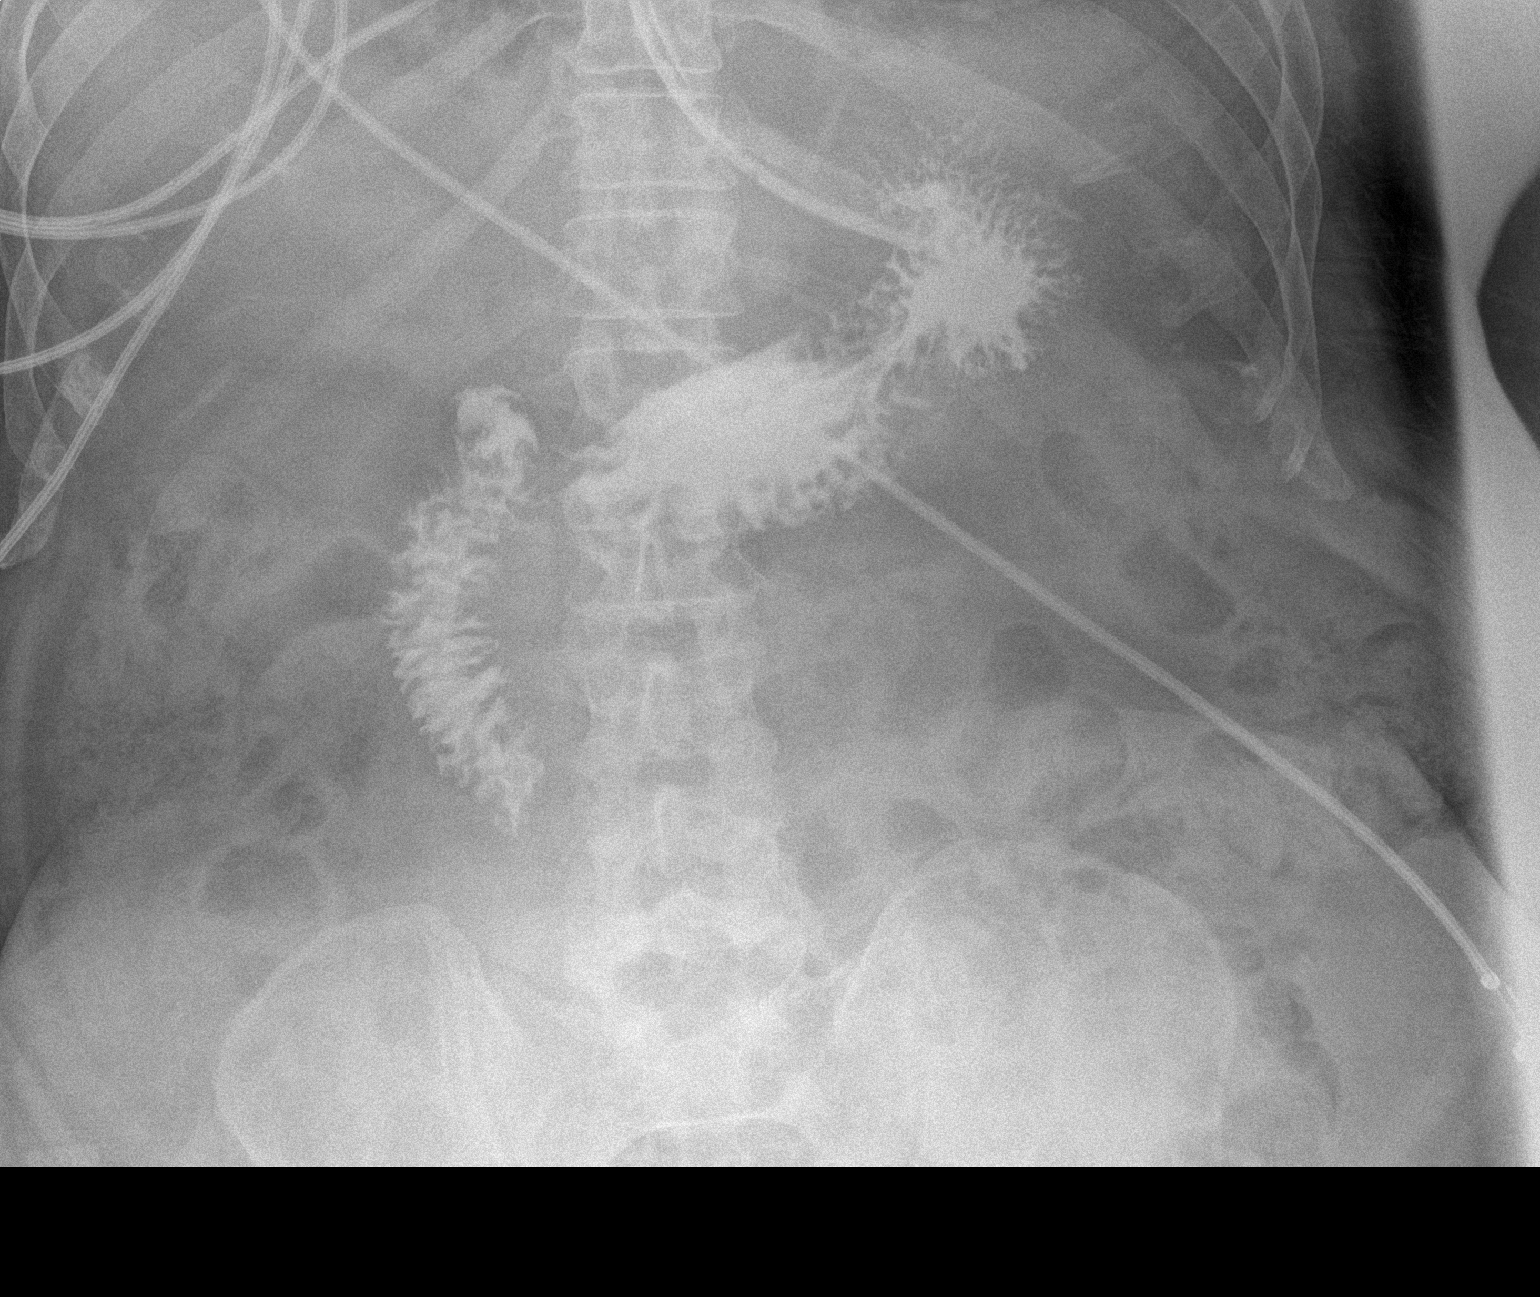

[1 of 1 positions shown; findings below may reference images not displayed]

FINDINGS: Contrast is seen within the stomach and proximal small bowel
confirming appropriate positioning of the gastrostomy tube. No
extraluminal contrast. Visualized bowel gas pattern is
nonobstructive
IMPRESSION: Contrast within the stomach and proximal small bowel confirming
appropriate positioning of the gastrostomy tube. No evidence of
contrast leakage.
